# Patient Record
Sex: Female | Born: 1955
Health system: Southern US, Community
[De-identification: ages and names within clinical notes are randomized; demographics above are authoritative.]

## PROBLEM LIST (undated history)

## (undated) DIAGNOSIS — E669 Obesity, unspecified: Secondary | ICD-10-CM

## (undated) DIAGNOSIS — E8881 Metabolic syndrome: Secondary | ICD-10-CM

## (undated) DIAGNOSIS — E559 Vitamin D deficiency, unspecified: Secondary | ICD-10-CM

## (undated) DIAGNOSIS — E785 Hyperlipidemia, unspecified: Secondary | ICD-10-CM

## (undated) DIAGNOSIS — I10 Essential (primary) hypertension: Secondary | ICD-10-CM

## (undated) DIAGNOSIS — G4700 Insomnia, unspecified: Secondary | ICD-10-CM

## (undated) DIAGNOSIS — G4730 Sleep apnea, unspecified: Secondary | ICD-10-CM

## (undated) DIAGNOSIS — R32 Unspecified urinary incontinence: Secondary | ICD-10-CM

## (undated) DIAGNOSIS — Z780 Asymptomatic menopausal state: Secondary | ICD-10-CM

## (undated) HISTORY — DX: Essential (primary) hypertension: I10

## (undated) HISTORY — DX: Hyperlipidemia, unspecified: E78.5

## (undated) HISTORY — DX: Metabolic syndrome: E88.81

## (undated) HISTORY — DX: Vitamin D deficiency, unspecified: E55.9

## (undated) HISTORY — DX: Sleep apnea, unspecified: G47.30

## (undated) HISTORY — DX: Insomnia, unspecified: G47.00

## (undated) HISTORY — DX: Obesity, unspecified: E66.9

## (undated) HISTORY — DX: Asymptomatic menopausal state: Z78.0

## (undated) HISTORY — DX: Metabolic syndrome: E88.810

## (undated) HISTORY — DX: Unspecified urinary incontinence: R32

## (undated) HISTORY — PX: TUBAL LIGATION: SHX77

---

## 2005-07-28 ENCOUNTER — Ambulatory Visit: Payer: Self-pay | Admitting: Family Medicine

## 2006-09-21 ENCOUNTER — Ambulatory Visit: Payer: Self-pay | Admitting: Family Medicine

## 2008-03-05 ENCOUNTER — Ambulatory Visit: Payer: Self-pay | Admitting: Family Medicine

## 2008-04-03 ENCOUNTER — Ambulatory Visit: Payer: Self-pay | Admitting: Family Medicine

## 2008-10-03 ENCOUNTER — Ambulatory Visit: Payer: Self-pay | Admitting: Family Medicine

## 2009-03-06 ENCOUNTER — Ambulatory Visit: Payer: Self-pay | Admitting: Family Medicine

## 2010-06-09 ENCOUNTER — Ambulatory Visit: Payer: Self-pay | Admitting: Family Medicine

## 2010-07-02 ENCOUNTER — Ambulatory Visit: Payer: Self-pay | Admitting: Family Medicine

## 2011-09-05 LAB — HM PAP SMEAR: HM Pap smear: NORMAL

## 2012-01-11 ENCOUNTER — Ambulatory Visit: Payer: Self-pay | Admitting: Family Medicine

## 2014-03-25 ENCOUNTER — Ambulatory Visit: Payer: Self-pay | Admitting: Family Medicine

## 2014-03-25 LAB — HM MAMMOGRAPHY: HM Mammogram: NORMAL

## 2014-10-07 ENCOUNTER — Other Ambulatory Visit: Payer: Self-pay | Admitting: Family Medicine

## 2014-10-08 ENCOUNTER — Other Ambulatory Visit: Payer: Self-pay

## 2014-10-08 DIAGNOSIS — M25569 Pain in unspecified knee: Secondary | ICD-10-CM

## 2014-10-08 MED ORDER — TRAMADOL HCL 50 MG PO TABS: 50.0000 mg | ORAL_TABLET | Freq: Three times a day (TID) | ORAL | Status: DC | PRN

## 2014-10-25 ENCOUNTER — Encounter: Payer: Self-pay | Admitting: Family Medicine

## 2014-10-25 DIAGNOSIS — M7550 Bursitis of unspecified shoulder: Secondary | ICD-10-CM | POA: Insufficient documentation

## 2014-10-25 DIAGNOSIS — E8881 Metabolic syndrome: Secondary | ICD-10-CM | POA: Insufficient documentation

## 2014-10-25 DIAGNOSIS — L304 Erythema intertrigo: Secondary | ICD-10-CM | POA: Insufficient documentation

## 2014-10-25 DIAGNOSIS — G4700 Insomnia, unspecified: Secondary | ICD-10-CM | POA: Insufficient documentation

## 2014-10-25 DIAGNOSIS — Z780 Asymptomatic menopausal state: Secondary | ICD-10-CM | POA: Insufficient documentation

## 2014-10-25 DIAGNOSIS — E668 Other obesity: Secondary | ICD-10-CM | POA: Insufficient documentation

## 2014-10-25 DIAGNOSIS — E669 Obesity, unspecified: Secondary | ICD-10-CM | POA: Insufficient documentation

## 2014-10-25 DIAGNOSIS — E785 Hyperlipidemia, unspecified: Secondary | ICD-10-CM | POA: Insufficient documentation

## 2014-10-25 DIAGNOSIS — N393 Stress incontinence (female) (male): Secondary | ICD-10-CM | POA: Insufficient documentation

## 2014-10-25 DIAGNOSIS — L918 Other hypertrophic disorders of the skin: Secondary | ICD-10-CM | POA: Insufficient documentation

## 2014-10-25 DIAGNOSIS — E559 Vitamin D deficiency, unspecified: Secondary | ICD-10-CM | POA: Insufficient documentation

## 2014-10-25 DIAGNOSIS — I10 Essential (primary) hypertension: Secondary | ICD-10-CM | POA: Insufficient documentation

## 2014-10-25 DIAGNOSIS — M25569 Pain in unspecified knee: Secondary | ICD-10-CM | POA: Insufficient documentation

## 2014-10-25 DIAGNOSIS — G4733 Obstructive sleep apnea (adult) (pediatric): Secondary | ICD-10-CM | POA: Insufficient documentation

## 2014-10-27 ENCOUNTER — Ambulatory Visit (INDEPENDENT_AMBULATORY_CARE_PROVIDER_SITE_OTHER): Payer: 59 | Admitting: Family Medicine

## 2014-10-27 ENCOUNTER — Encounter: Payer: Self-pay | Admitting: Family Medicine

## 2014-10-27 VITALS — BP 134/86 | HR 68 | Temp 97.5°F | Resp 18 | Ht 67.0 in | Wt 342.3 lb

## 2014-10-27 DIAGNOSIS — G4700 Insomnia, unspecified: Secondary | ICD-10-CM

## 2014-10-27 DIAGNOSIS — E559 Vitamin D deficiency, unspecified: Secondary | ICD-10-CM | POA: Diagnosis not present

## 2014-10-27 DIAGNOSIS — E8881 Metabolic syndrome: Secondary | ICD-10-CM

## 2014-10-27 DIAGNOSIS — G4733 Obstructive sleep apnea (adult) (pediatric): Secondary | ICD-10-CM | POA: Diagnosis not present

## 2014-10-27 DIAGNOSIS — I10 Essential (primary) hypertension: Secondary | ICD-10-CM | POA: Diagnosis not present

## 2014-10-27 DIAGNOSIS — E785 Hyperlipidemia, unspecified: Secondary | ICD-10-CM | POA: Diagnosis not present

## 2014-10-27 DIAGNOSIS — M25569 Pain in unspecified knee: Secondary | ICD-10-CM

## 2014-10-27 DIAGNOSIS — E668 Other obesity: Secondary | ICD-10-CM

## 2014-10-27 DIAGNOSIS — E669 Obesity, unspecified: Secondary | ICD-10-CM

## 2014-10-27 MED ORDER — MELOXICAM 15 MG PO TABS: 15.0000 mg | ORAL_TABLET | Freq: Every day | ORAL | Status: DC

## 2014-10-27 MED ORDER — TRAZODONE HCL 100 MG PO TABS: 100.0000 mg | ORAL_TABLET | Freq: Every day | ORAL | Status: DC

## 2014-10-27 MED ORDER — AMLODIPINE BESY-BENAZEPRIL HCL 5-20 MG PO CAPS: 1.0000 | ORAL_CAPSULE | Freq: Two times a day (BID) | ORAL | Status: DC

## 2014-10-27 MED ORDER — PHENTERMINE-TOPIRAMATE ER 7.5-46 MG PO CP24: 1.0000 | ORAL_CAPSULE | Freq: Every day | ORAL | Status: DC

## 2014-10-27 MED ORDER — AMLODIPINE BESY-BENAZEPRIL HCL 5-20 MG PO CAPS: 1.0000 | ORAL_CAPSULE | Freq: Every day | ORAL | Status: DC

## 2014-10-27 MED ORDER — TRAMADOL HCL 50 MG PO TABS: 50.0000 mg | ORAL_TABLET | Freq: Three times a day (TID) | ORAL | Status: DC | PRN

## 2014-10-27 MED ORDER — ATORVASTATIN CALCIUM 40 MG PO TABS: 40.0000 mg | ORAL_TABLET | Freq: Every day | ORAL | Status: DC

## 2014-10-27 MED ORDER — ATENOLOL-CHLORTHALIDONE 50-25 MG PO TABS: 1.0000 | ORAL_TABLET | Freq: Every day | ORAL | Status: DC

## 2014-10-27 NOTE — Patient Instructions (Signed)

## 2014-10-27 NOTE — Progress Notes (Signed)
Name: Emma Oconnor   MRN: [id number]    DOB: 05-07-1955   Date:10/27/2014       Progress Note  Subjective  Chief Complaint  Chief Complaint  Patient presents with  . Medication Refill    6 month F/U  . Hypertension  . Insomnia    Total 5 hours, able to fall asleep, but waking up during the night  . Hyperlipidemia    HPI  HTN: taking medication for bp, no side effects, bp is at goal, she states she is only taking one pill of Lotrel daily, not two daily .  Hyperlipidemia: taking medication as prescribed. Denies side effects of medications.  Insomnia: she has noticed that she is not sleeping as well since she gained weight , Trazodone helps her fall asleep but not stay asleep. Average 5 hours on fit bit, but slept 7 hours last night.    Obesity: she was 352.6 lbs in March 2015, she was started on Qsymia and lost over 30lbs, however husband lost his job, in December 2015 and she was unable to afford medication. She is still going to the gym, but has gained weight. Currently at 342 lbs, willing to resume medication  OSA: wears CPAP every night, feels a little tired during the day, she was feeling better when she lost weight and we will resume weight loss regiment now.    Patient Active Problem List   Diagnosis Date Noted  . Benign essential HTN 10/25/2014  . Bursitis of shoulder 10/25/2014  . Insomnia, persistent 10/25/2014  . Dyslipidemia 10/25/2014  . Female stress incontinence 10/25/2014  . Eczema intertrigo 10/25/2014  . Menopause 10/25/2014  . Dysmetabolic syndrome 63/78/5885  . Extreme obesity 10/25/2014  . Obstructive apnea 10/25/2014  . Vitamin D deficiency 10/25/2014  . Skin tag 10/25/2014  . Knee pain 10/25/2014    Past Surgical History  Procedure Laterality Date  . Tubal ligation      Family History  Problem Relation Age of Onset  . Diabetes Mother   . Hypertension Mother   . Hyperlipidemia Mother   . Multiple sclerosis Brother   . Diabetes Brother      History   Social History  . Marital Status: Married    Spouse Name: N/A  . Number of Children: N/A  . Years of Education: N/A   Occupational History  . Not on file.   Social History Main Topics  . Smoking status: Former Smoker -- 0.50 packs/day for 30 years    Types: Cigarettes    Start date: 04/19/1975    Quit date: 12/17/2005  . Smokeless tobacco: Never Used  . Alcohol Use: No  . Drug Use: No  . Sexual Activity: Not Currently   Other Topics Concern  . Not on file   Social History Narrative     Current outpatient prescriptions:  .  amLODipine-benazepril (LOTREL) 5-20 MG per capsule, Take 1 capsule by mouth 2 (two) times daily., Disp: 180 capsule, Rfl: 1 .  atenolol-chlorthalidone (TENORETIC 50) 50-25 MG per tablet, Take 1 tablet by mouth daily., Disp: 90 tablet, Rfl: 1 .  atorvastatin (LIPITOR) 40 MG tablet, Take 1 tablet (40 mg total) by mouth daily at 6 PM., Disp: 90 tablet, Rfl: 1 .  meloxicam (MOBIC) 15 MG tablet, Take 1 tablet (15 mg total) by mouth daily., Disp: 90 tablet, Rfl: 1 .  traMADol (ULTRAM) 50 MG tablet, Take 1 tablet (50 mg total) by mouth every 8 (eight) hours as needed., Disp: 90 tablet, Rfl:  1 .  traZODone (DESYREL) 100 MG tablet, Take 1 tablet (100 mg total) by mouth at bedtime., Disp: 90 tablet, Rfl: 1  No Known Allergies   ROS  Constitutional: Negative for fever positive for  weight change.  Respiratory: Negative for cough and shortness of breath.   Cardiovascular: Negative for chest pain or palpitations.  Gastrointestinal: Negative for abdominal pain, no bowel changes.  Musculoskeletal: Negative for gait problem or joint swelling. Bilateral knee pain   Skin: Negative for rash.  Neurological: Negative for dizziness or headache.  No other specific complaints in a complete review of systems (except as listed in HPI above).  Objective  Filed Vitals:   10/27/14 1333  BP: 134/86  Pulse: 68  Temp: 97.5 F (36.4 C)  TempSrc: Oral   Resp: 18  Height: 5\' 7"  (1.702 m)  Weight: 342 lb 4.8 oz (155.266 kg)  SpO2: 94%    Body mass index is 53.6 kg/(m^2).  Physical Exam  Constitutional: Patient appears well-developed and well-nourished. No distress. Morbid obesity  Eyes:  No scleral icterus. PERL Neck: Normal range of motion. Neck supple. Cardiovascular: Normal rate, regular rhythm and normal heart sounds.  No murmur heard. Trace  BLE edema. Pulmonary/Chest: Effort normal and breath sounds normal. No respiratory distress. Abdominal: Soft.  There is no tenderness. Psychiatric: Patient has a normal mood and affect. behavior is normal. Judgment and thought content normal. Muscular Skeletal: crepitus with extension of both knees, no effusion    PHQ2/9: Depression screen PHQ 2/9 10/27/2014  Decreased Interest 0  Down, Depressed, Hopeless 0  PHQ - 2 Score 0     Fall Risk: Fall Risk  10/27/2014  Falls in the past year? No     Assessment & Plan  1. Benign essential HTN At goal - Comprehensive Metabolic Panel (CMET) - atenolol-chlorthalidone (TENORETIC 50) 50-25 MG per tablet; Take 1 tablet by mouth daily.  Dispense: 90 tablet; Refill: 1 - amLODipine-benazepril (LOTREL) 5-20 MG per capsule; Take 1 capsule by mouth daily.  Dispense: 90 capsule; Refill: 1  2. Dyslipidemia  - Lipid Profile - atorvastatin (LIPITOR) 40 MG tablet; Take 1 tablet (40 mg total) by mouth daily at 6 PM.  Dispense: 90 tablet; Refill: 1  3. Extreme Obesity - Phentermine-Topiramate (QSYMIA) 7.5-46 MG CP24; Take 1 capsule by mouth daily.  Dispense: 30 capsule; Refill: 2  4. Obstructive apnea Continue CPAP machine  5. Dysmetabolic syndrome  - HgB B2E  6. Vitamin D deficiency  - Vitamin D (25 hydroxy)  7. Insomnia, persistent  - traZODone (DESYREL) 100 MG tablet; Take 1 tablet (100 mg total) by mouth at bedtime.  Dispense: 90 tablet; Refill: 1  8. Knee pain, unspecified laterality  - traMADol (ULTRAM) 50 MG tablet; Take 1  tablet (50 mg total) by mouth every 8 (eight) hours as needed.  Dispense: 90 tablet; Refill: 1 - meloxicam (MOBIC) 15 MG tablet; Take 1 tablet (15 mg total) by mouth daily.  Dispense: 90 tablet; Refill: 1

## 2014-10-30 ENCOUNTER — Telehealth: Payer: Self-pay | Admitting: Family Medicine

## 2014-10-30 NOTE — Telephone Encounter (Signed)
No, I have not seen one but, I can work on one for the [employer]

## 2014-10-30 NOTE — Telephone Encounter (Signed)
Pt was prescribed qsymia. Patient called insurance and they told her that if we call them [phone] they will allow [place] to do a prior authorization and she will be able to use her discount card. Her employee id: [phone]. Please call patient once this has been completed [phone]

## 2014-10-30 NOTE — Telephone Encounter (Signed)
Have we received a PA for this patient Qsymia?

## 2014-10-31 NOTE — Telephone Encounter (Signed)
Ok. Thanks!

## 2014-11-25 ENCOUNTER — Telehealth: Payer: Self-pay | Admitting: Family Medicine

## 2014-11-25 NOTE — Telephone Encounter (Signed)
PT SAID THAT IF THE REFERRAL COORDINATOR WILL CALL [facility] ABOUT HER QYSMIA SHE CAN GET IT AT A REDUCED RATE . SAID THAT SHE WAS TOLD THAT SOMEONE WOULD CALL HER BACK AND A NOTE WAS SUPPOSE TO BE PLACED BY [facility] BUT THAT HAS BEEN 2 WKS AT LEAST AND SHE HAS NOT HEARD FROM ANYONE.

## 2014-12-15 ENCOUNTER — Ambulatory Visit (INDEPENDENT_AMBULATORY_CARE_PROVIDER_SITE_OTHER): Payer: 59 | Admitting: Family Medicine

## 2014-12-15 ENCOUNTER — Encounter: Payer: Self-pay | Admitting: Family Medicine

## 2014-12-15 VITALS — BP 136/78 | HR 83 | Temp 98.7°F | Resp 16 | Ht 67.0 in | Wt 321.8 lb

## 2014-12-15 DIAGNOSIS — Z1239 Encounter for other screening for malignant neoplasm of breast: Secondary | ICD-10-CM | POA: Diagnosis not present

## 2014-12-15 DIAGNOSIS — Z124 Encounter for screening for malignant neoplasm of cervix: Secondary | ICD-10-CM

## 2014-12-15 DIAGNOSIS — Z0000 Encounter for general adult medical examination without abnormal findings: Secondary | ICD-10-CM

## 2014-12-15 DIAGNOSIS — Z1211 Encounter for screening for malignant neoplasm of colon: Secondary | ICD-10-CM | POA: Diagnosis not present

## 2014-12-15 DIAGNOSIS — E668 Other obesity: Secondary | ICD-10-CM

## 2014-12-15 DIAGNOSIS — Z23 Encounter for immunization: Secondary | ICD-10-CM

## 2014-12-15 DIAGNOSIS — Z01419 Encounter for gynecological examination (general) (routine) without abnormal findings: Secondary | ICD-10-CM

## 2014-12-15 MED ORDER — ASPIRIN EC 81 MG PO TBEC: 81.0000 mg | DELAYED_RELEASE_TABLET | Freq: Every day | ORAL | Status: DC

## 2014-12-15 MED ORDER — ALPRAZOLAM 0.5 MG PO TBDP: 0.5000 mg | ORAL_TABLET | Freq: Three times a day (TID) | ORAL | Status: DC | PRN

## 2014-12-15 NOTE — Progress Notes (Signed)
Name: Emma Oconnor   MRN: [id number]    DOB: November 16, 1955   Date:12/15/2014       Progress Note  Subjective  Chief Complaint  Chief Complaint  Patient presents with  . Annual Exam    HPI  Well Woman Exam: she is worried about getting a colonoscopy  - afraid of going under, but willing to try. She is due for mammogram at the end of the year, pap smear was done 3 years ago without HPV we will recheck it today. Losing weight with calorie counting, eating 1750 daily and is also taking Qsymia , denies side effects of medication    Patient Active Problem List   Diagnosis Date Noted  . Benign essential HTN 10/25/2014  . Bursitis of shoulder 10/25/2014  . Insomnia, persistent 10/25/2014  . Dyslipidemia 10/25/2014  . Female stress incontinence 10/25/2014  . Eczema intertrigo 10/25/2014  . Menopause 10/25/2014  . Dysmetabolic syndrome 00/86/7619  . Extreme obesity 10/25/2014  . Obstructive apnea 10/25/2014  . Vitamin D deficiency 10/25/2014  . Skin tag 10/25/2014  . Knee pain 10/25/2014    Past Surgical History  Procedure Laterality Date  . Tubal ligation      Family History  Problem Relation Age of Onset  . Diabetes Mother   . Hypertension Mother   . Hyperlipidemia Mother   . Multiple sclerosis Brother   . Diabetes Brother     Social History   Social History  . Marital Status: Married    Spouse Name: N/A  . Number of Children: N/A  . Years of Education: N/A   Occupational History  . Not on file.   Social History Main Topics  . Smoking status: Former Smoker -- 0.50 packs/day for 30 years    Types: Cigarettes    Start date: 04/19/1975    Quit date: 12/17/2005  . Smokeless tobacco: Never Used  . Alcohol Use: No  . Drug Use: No  . Sexual Activity: Not Currently   Other Topics Concern  . Not on file   Social History Narrative     Current outpatient prescriptions:  .  amLODipine-benazepril (LOTREL) 5-20 MG per capsule, Take 1 capsule by mouth daily., Disp:  90 capsule, Rfl: 1 .  atenolol-chlorthalidone (TENORETIC 50) 50-25 MG per tablet, Take 1 tablet by mouth daily., Disp: 90 tablet, Rfl: 1 .  atorvastatin (LIPITOR) 40 MG tablet, Take 1 tablet (40 mg total) by mouth daily at 6 PM., Disp: 90 tablet, Rfl: 1 .  Phentermine-Topiramate (QSYMIA) 7.5-46 MG CP24, Take 1 capsule by mouth daily., Disp: 30 capsule, Rfl: 2 .  traMADol (ULTRAM) 50 MG tablet, Take 1 tablet (50 mg total) by mouth every 8 (eight) hours as needed., Disp: 90 tablet, Rfl: 1 .  traZODone (DESYREL) 100 MG tablet, Take 1 tablet (100 mg total) by mouth at bedtime., Disp: 90 tablet, Rfl: 1 .  ALPRAZolam (NIRAVAM) 0.5 MG dissolvable tablet, Take 1 tablet (0.5 mg total) by mouth 3 (three) times daily as needed for anxiety., Disp: 4 tablet, Rfl: 0 .  meloxicam (MOBIC) 15 MG tablet, Take 1 tablet (15 mg total) by mouth daily. (Patient not taking: Reported on 12/15/2014), Disp: 90 tablet, Rfl: 1  No Known Allergies   ROS  Constitutional: Negative for fever positive for  weight change.  Respiratory: Negative for cough and shortness of breath.   Cardiovascular: Negative for chest pain or palpitations.  Gastrointestinal: Negative for abdominal pain, no bowel changes.  Musculoskeletal: Negative for gait problem or joint  swelling.  Skin: Negative for rash.  Neurological: Negative for dizziness or headache.  No other specific complaints in a complete review of systems (except as listed in HPI above).  Objective  Filed Vitals:   12/15/14 1414  BP: 136/78  Pulse: 83  Temp: 98.7 F (37.1 C)  TempSrc: Oral  Resp: 16  Height: 5\' 7"  (1.702 m)  Weight: 321 lb 12.8 oz (145.968 kg)  SpO2: 97%    Body mass index is 50.39 kg/(m^2).  Physical Exam  Constitutional: Patient appears well-developed and morbidly obesity . No distress.  HENT: Head: Normocephalic and atraumatic. Ears: B TMs ok, no erythema or effusion; Nose: Nose normal. Mouth/Throat: Oropharynx is clear and moist. No  oropharyngeal exudate.  Eyes: Conjunctivae and EOM are normal. Pupils are equal, round, and reactive to light. No scleral icterus.  Neck: Normal range of motion. Neck supple. No JVD present. No thyromegaly present.  Cardiovascular: Normal rate, regular rhythm and normal heart sounds.  No murmur heard. No BLE edema. Pulmonary/Chest: Effort normal and breath sounds normal. No respiratory distress. Abdominal: Soft. Bowel sounds are normal, no distension. There is no tenderness. no masses Breast: no lumps or masses, no nipple discharge or rashes FEMALE GENITALIA:  External genitalia normal External urethra normal Vaginal vault normal without discharge or lesions Cervix normal without discharge or lesions Bimanual exam normal without masses RECTAL: not done Musculoskeletal: Normal range of motion, no joint effusions. No gross deformities Neurological: he is alert and oriented to person, place, and time. No cranial nerve deficit. Coordination, balance, strength, speech and gait are normal.  Skin: Skin is warm and dry. No rash noted. No erythema.  Psychiatric: Patient has a normal mood and affect. behavior is normal. Judgment and thought content normal.  PHQ2/9: Depression screen PHQ 2/9 10/27/2014  Decreased Interest 0  Down, Depressed, Hopeless 0  PHQ - 2 Score 0    Fall Risk: Fall Risk  10/27/2014  Falls in the past year? No     Assessment & Plan  1. Well woman exam Discussed importance of 150 minutes of physical activity weekly, eat two servings of fish weekly, eat one serving of tree nuts ( cashews, pistachios, pecans, almonds.Marland Kitchen) every other day, eat 6 servings of fruit/vegetables daily and drink plenty of water and avoid sweet beverages.   2. Needs flu shot  - Flu Vaccine QUAD 36+ mos IM  3. Cervical cancer screening  - PapLb, HPV, rfx16/18  4. Colon cancer screening  - Ambulatory referral to Gastroenterology - ALPRAZolam (NIRAVAM) 0.5 MG dissolvable tablet; Take 1 tablet  (0.5 mg total) by mouth 3 (three) times daily as needed for anxiety.  Dispense: 4 tablet; Refill: 0  5. Breast cancer screening  - MM Digital Screening; Future  6. Extreme obesity She has changed her diet , eating 1750 calories daily, taking Qsymia for the past month and has lost 21 lbs . Doing well.

## 2014-12-16 LAB — COMPREHENSIVE METABOLIC PANEL
ALBUMIN: 4 g/dL (ref 3.5–5.5)
ALT: 16 IU/L (ref 0–32)
AST: 19 IU/L (ref 0–40)
Albumin/Globulin Ratio: 1.5 (ref 1.1–2.5)
Alkaline Phosphatase: 99 IU/L (ref 39–117)
BUN/Creatinine Ratio: 19 (ref 9–23)
BUN: 14 mg/dL (ref 6–24)
Bilirubin Total: 0.4 mg/dL (ref 0.0–1.2)
CO2: 27 mmol/L (ref 18–29)
Calcium: 9.3 mg/dL (ref 8.7–10.2)
Chloride: 99 mmol/L (ref 97–108)
Creatinine, Ser: 0.73 mg/dL (ref 0.57–1.00)
GFR calc Af Amer: 106 mL/min/{1.73_m2} (ref >59)
GFR, EST NON AFRICAN AMERICAN: 92 mL/min/{1.73_m2} (ref >59)
GLOBULIN, TOTAL: 2.7 g/dL (ref 1.5–4.5)
GLUCOSE: 97 mg/dL (ref 65–99)
Potassium: 3 mmol/L — ABNORMAL LOW (ref 3.5–5.2)
SODIUM: 142 mmol/L (ref 134–144)
Total Protein: 6.7 g/dL (ref 6.0–8.5)

## 2014-12-16 LAB — LIPID PANEL
Chol/HDL Ratio: 2.9 ratio units (ref 0.0–4.4)
Cholesterol, Total: 175 mg/dL (ref 100–199)
HDL: 61 mg/dL (ref >39)
LDL Calculated: 95 mg/dL (ref 0–99)
Triglycerides: 97 mg/dL (ref 0–149)
VLDL Cholesterol Cal: 19 mg/dL (ref 5–40)

## 2014-12-16 LAB — HEMOGLOBIN A1C
ESTIMATED AVERAGE GLUCOSE: 126 mg/dL
Hgb A1c MFr Bld: 6 % — ABNORMAL HIGH (ref 4.8–5.6)

## 2014-12-16 LAB — VITAMIN D 25 HYDROXY (VIT D DEFICIENCY, FRACTURES): Vit D, 25-Hydroxy: 29.9 ng/mL — ABNORMAL LOW (ref 30.0–100.0)

## 2014-12-16 NOTE — Progress Notes (Signed)
Left voice mail

## 2014-12-20 LAB — PAPLB, HPV, RFX16/18: PAP SMEAR COMMENT: 0

## 2014-12-23 NOTE — Progress Notes (Signed)
Left voice mail

## 2014-12-23 NOTE — Progress Notes (Signed)
Patient notified

## 2014-12-30 ENCOUNTER — Other Ambulatory Visit: Payer: Self-pay

## 2014-12-30 ENCOUNTER — Telehealth: Payer: Self-pay | Admitting: Gastroenterology

## 2014-12-30 NOTE — Telephone Encounter (Signed)
Gastroenterology Pre-Procedure Review  Request Date: 02-03-2015 Requesting Physician: Dr. Ancil Boozer  PATIENT REVIEW QUESTIONS: The patient responded to the following health history questions as indicated:    1. Are you having any GI issues? no 2. Do you have a personal history of Polyps? no 3. Do you have a family history of Colon Cancer or Polyps? no 4. Diabetes Mellitus? no 5. Joint replacements in the past 12 months?no 6. Major health problems in the past 3 months?no 7. Any artificial heart valves, MVP, or defibrillator?no    MEDICATIONS & ALLERGIES:    Patient reports the following regarding taking any anticoagulation/antiplatelet therapy:   Plavix, Coumadin, Eliquis, Xarelto, Lovenox, Pradaxa, Brilinta, or Effient? no Aspirin? yes (Daily)  Patient confirms/reports the following medications:  Current Outpatient Prescriptions  Medication Sig Dispense Refill   ALPRAZolam (NIRAVAM) 0.5 MG dissolvable tablet Take 1 tablet (0.5 mg total) by mouth 3 (three) times daily as needed for anxiety. 4 tablet 0   amLODipine-benazepril (LOTREL) 5-20 MG per capsule Take 1 capsule by mouth daily. 90 capsule 1   aspirin EC 81 MG tablet Take 1 tablet (81 mg total) by mouth daily. 30 tablet 0   atenolol-chlorthalidone (TENORETIC 50) 50-25 MG per tablet Take 1 tablet by mouth daily. 90 tablet 1   atorvastatin (LIPITOR) 40 MG tablet Take 1 tablet (40 mg total) by mouth daily at 6 PM. 90 tablet 1   meloxicam (MOBIC) 15 MG tablet Take 1 tablet (15 mg total) by mouth daily. (Patient not taking: Reported on 12/15/2014) 90 tablet 1   Phentermine-Topiramate (QSYMIA) 7.5-46 MG CP24 Take 1 capsule by mouth daily. 30 capsule 2   traMADol (ULTRAM) 50 MG tablet Take 1 tablet (50 mg total) by mouth every 8 (eight) hours as needed. 90 tablet 1   traZODone (DESYREL) 100 MG tablet Take 1 tablet (100 mg total) by mouth at bedtime. 90 tablet 1   No current facility-administered medications for this visit.     Patient confirms/reports the following allergies:  No Known Allergies  No orders of the defined types were placed in this encounter.    AUTHORIZATION INFORMATION Primary Insurance: 1D#: Group #:  Secondary Insurance: 1D#: Group #:  SCHEDULE INFORMATION: Date: 02-03-2015 Time: Location:[facility]

## 2015-01-27 ENCOUNTER — Ambulatory Visit (INDEPENDENT_AMBULATORY_CARE_PROVIDER_SITE_OTHER): Payer: 59 | Admitting: Family Medicine

## 2015-01-27 ENCOUNTER — Encounter: Payer: Self-pay | Admitting: Family Medicine

## 2015-01-27 ENCOUNTER — Encounter (INDEPENDENT_AMBULATORY_CARE_PROVIDER_SITE_OTHER): Payer: Self-pay

## 2015-01-27 VITALS — BP 116/64 | HR 72 | Temp 98.7°F | Resp 16 | Ht 67.0 in | Wt 315.4 lb

## 2015-01-27 DIAGNOSIS — G4733 Obstructive sleep apnea (adult) (pediatric): Secondary | ICD-10-CM | POA: Diagnosis not present

## 2015-01-27 DIAGNOSIS — I10 Essential (primary) hypertension: Secondary | ICD-10-CM | POA: Diagnosis not present

## 2015-01-27 DIAGNOSIS — M25561 Pain in right knee: Secondary | ICD-10-CM

## 2015-01-27 DIAGNOSIS — E785 Hyperlipidemia, unspecified: Secondary | ICD-10-CM | POA: Diagnosis not present

## 2015-01-27 DIAGNOSIS — M25562 Pain in left knee: Secondary | ICD-10-CM

## 2015-01-27 DIAGNOSIS — G4700 Insomnia, unspecified: Secondary | ICD-10-CM | POA: Diagnosis not present

## 2015-01-27 MED ORDER — PHENTERMINE-TOPIRAMATE ER 7.5-46 MG PO CP24: 1.0000 | ORAL_CAPSULE | Freq: Every day | ORAL | Status: DC

## 2015-01-27 NOTE — Progress Notes (Signed)
Name: Emma Oconnor   MRN: [id number]    DOB: 07/18/1955   Date:01/27/2015       Progress Note  Subjective  Chief Complaint  Chief Complaint  Patient presents with  . Medication Refill    follow-up  . Hypertension  . Hyperlipidemia  . Insomnia  . Arthritis  . Obesity    HPI   HTN: taking medication for bp, no side effects, bp is at goal, no dizziness, no chest pain or palpitation  Hyperlipidemia: taking medication as prescribed. Denies side effects of medications, no myalgia  Insomnia: Trazodone helps her fall asleep but not stay asleep. Average 5 hours on fit bit, waking up sometimes because of knee pain.    Obesity: she was 352.6 lbs in March 2015, she was started on Qsymia and lost over  5 % of original weight, however husband lost his job, in December 2015 and she was unable to afford medication. She is still going to the gym, but has gained weight, we resume medication at weight of 342 lbs in July and she is down to 315 lbs today. Still following diet and going to the gym on regular basis   OSA: wears CPAP but she has been skipping it at times because she does not want to be addicted to the machine, since it makes her wake up feeling better. Explained importance of compliance and risk of heart attacks and strokes if she does not use it.   Knee pain: bilateral pain, intermittently, aching like, around anterior knee, worse with activity, taking Meloxicam and Tramadol and seems to help, sometimes takes Tylenol    Patient Active Problem List   Diagnosis Date Noted  . Benign essential HTN 10/25/2014  . Bursitis of shoulder 10/25/2014  . Insomnia, persistent 10/25/2014  . Dyslipidemia 10/25/2014  . Female stress incontinence 10/25/2014  . Eczema intertrigo 10/25/2014  . Menopause 10/25/2014  . Dysmetabolic syndrome 15/72/6203  . Extreme obesity ([place]) 10/25/2014  . Obstructive apnea 10/25/2014  . Vitamin D deficiency 10/25/2014  . Skin tag 10/25/2014  . Knee pain  10/25/2014    Past Surgical History  Procedure Laterality Date  . Tubal ligation      Family History  Problem Relation Age of Onset  . Diabetes Mother   . Hypertension Mother   . Hyperlipidemia Mother   . Multiple sclerosis Brother   . Diabetes Brother     Social History   Social History  . Marital Status: Married    Spouse Name: N/A  . Number of Children: N/A  . Years of Education: N/A   Occupational History  . Not on file.   Social History Main Topics  . Smoking status: Former Smoker -- 0.50 packs/day for 30 years    Types: Cigarettes    Start date: 04/19/1975    Quit date: 12/17/2005  . Smokeless tobacco: Never Used  . Alcohol Use: No  . Drug Use: No  . Sexual Activity: Not Currently   Other Topics Concern  . Not on file   Social History Narrative     Current outpatient prescriptions:  .  amLODipine-benazepril (LOTREL) 5-20 MG per capsule, Take 1 capsule by mouth daily., Disp: 90 capsule, Rfl: 1 .  aspirin EC 81 MG tablet, Take 1 tablet (81 mg total) by mouth daily., Disp: 30 tablet, Rfl: 0 .  atenolol-chlorthalidone (TENORETIC 50) 50-25 MG per tablet, Take 1 tablet by mouth daily., Disp: 90 tablet, Rfl: 1 .  atorvastatin (LIPITOR) 40 MG tablet, Take 1  tablet (40 mg total) by mouth daily at 6 PM., Disp: 90 tablet, Rfl: 1 .  meloxicam (MOBIC) 15 MG tablet, Take 1 tablet (15 mg total) by mouth daily. (Patient not taking: Reported on 12/15/2014), Disp: 90 tablet, Rfl: 1 .  Phentermine-Topiramate (QSYMIA) 7.5-46 MG CP24, Take 1 capsule by mouth daily., Disp: 30 capsule, Rfl: 2 .  traMADol (ULTRAM) 50 MG tablet, Take 1 tablet (50 mg total) by mouth every 8 (eight) hours as needed., Disp: 90 tablet, Rfl: 1 .  traZODone (DESYREL) 100 MG tablet, Take 1 tablet (100 mg total) by mouth at bedtime., Disp: 90 tablet, Rfl: 1  No Known Allergies   ROS  Constitutional: Negative for fever and positive for  weight change.  Respiratory: Negative for cough and shortness of  breath.   Cardiovascular: Negative for chest pain or palpitations.  Gastrointestinal: Negative for abdominal pain, no bowel changes.  Musculoskeletal: Negative for gait problem or joint swelling.  Skin: Negative for rash.  Neurological: Negative for dizziness or headache.  No other specific complaints in a complete review of systems (except as listed in HPI above).  Objective  Filed Vitals:   01/27/15 1609  BP: 116/64  Pulse: 72  Temp: 98.7 F (37.1 C)  TempSrc: Oral  Resp: 16  Height: 5\' 7"  (1.702 m)  Weight: 315 lb 6.4 oz (143.065 kg)  SpO2: 96%    Body mass index is 49.39 kg/(m^2).  Physical Exam  Constitutional: Patient appears well-developed and well-nourished. Obese No distress.  HEENT: head atraumatic, normocephalic, pupils equal and reactive to light,  neck supple, throat within normal limits Cardiovascular: Normal rate, regular rhythm and normal heart sounds.  No murmur heard. No BLE edema. Pulmonary/Chest: Effort normal and breath sounds normal. No respiratory distress. Abdominal: Soft.  There is no tenderness. Psychiatric: Patient has a normal mood and affect. behavior is normal. Judgment and thought content normal. Muscular Skeletal: some grinding with extension of both knees, no effusion, no redness  Recent Results (from the past 2160 hour(s))  Lipid Profile     Status: None   Collection Time: 12/15/14  7:58 AM  Result Value Ref Range   Cholesterol, Total 175 100 - 199 mg/dL   Triglycerides 97 0 - 149 mg/dL   HDL 61 >39 mg/dL    Comment: According to ATP-III Guidelines, HDL-C >59 mg/dL is considered a negative risk factor for CHD.    VLDL Cholesterol Cal 19 5 - 40 mg/dL   LDL Calculated 95 0 - 99 mg/dL   Chol/HDL Ratio 2.9 0.0 - 4.4 ratio units    Comment:                                   T. Chol/HDL Ratio                                             Men  Women                               1/2 Avg.Risk  3.4    3.3  Avg.Risk  5.0    4.4                                2X Avg.Risk  9.6    7.1                                3X Avg.Risk 23.4   11.0   Comprehensive Metabolic Panel (CMET)     Status: Abnormal   Collection Time: 12/15/14  7:58 AM  Result Value Ref Range   Glucose 97 65 - 99 mg/dL   BUN 14 6 - 24 mg/dL   Creatinine, Ser 0.73 0.57 - 1.00 mg/dL   GFR calc non Af Amer 92 >59 mL/min/1.73   GFR calc Af Amer 106 >59 mL/min/1.73   BUN/Creatinine Ratio 19 9 - 23   Sodium 142 134 - 144 mmol/L   Potassium 3.0 (L) 3.5 - 5.2 mmol/L   Chloride 99 97 - 108 mmol/L   CO2 27 18 - 29 mmol/L   Calcium 9.3 8.7 - 10.2 mg/dL   Total Protein 6.7 6.0 - 8.5 g/dL   Albumin 4.0 3.5 - 5.5 g/dL   Globulin, Total 2.7 1.5 - 4.5 g/dL   Albumin/Globulin Ratio 1.5 1.1 - 2.5   Bilirubin Total 0.4 0.0 - 1.2 mg/dL   Alkaline Phosphatase 99 39 - 117 IU/L   AST 19 0 - 40 IU/L   ALT 16 0 - 32 IU/L  HgB A1c     Status: Abnormal   Collection Time: 12/15/14  7:58 AM  Result Value Ref Range   Hgb A1c MFr Bld 6.0 (H) 4.8 - 5.6 %    Comment:          Pre-diabetes: 5.7 - 6.4          Diabetes: >6.4          Glycemic control for adults with diabetes: <7.0    Est. average glucose Bld gHb Est-mCnc 126 mg/dL  Vitamin D (25 hydroxy)     Status: Abnormal   Collection Time: 12/15/14  7:58 AM  Result Value Ref Range   Vit D, 25-Hydroxy 29.9 (L) 30.0 - 100.0 ng/mL    Comment: Vitamin D deficiency has been defined by the Institute of Medicine and an Endocrine Society practice guideline as a level of serum 25-OH vitamin D less than 20 ng/mL (1,2). The Endocrine Society went on to further define vitamin D insufficiency as a level between 21 and 29 ng/mL (2). 1. IOM (Institute of Medicine). 2010. Dietary reference    intakes for calcium and D. [place]: The    [employer]. 2. Holick MF, Binkley Vega, Bischoff-Ferrari HA, et al.    Evaluation, treatment, and prevention of vitamin D    deficiency: an Endocrine  Society clinical practice    guideline. JCEM. 2011 Jul; 96(7):1911-30.   PapLb, HPV, rfx16/18     Status: None   Collection Time: 12/16/14 12:00 AM  Result Value Ref Range   DIAGNOSIS: Comment     Comment: NEGATIVE FOR INTRAEPITHELIAL LESION AND MALIGNANCY.   Specimen adequacy: Comment     Comment: Satisfactory for evaluation. No endocervical component is identified.   CLINICIAN PROVIDED ICD10: Comment     Comment: Z12.4   Performed by: Comment     Comment: Charity Atilano Median, Cytotechnologist (ASCP)   PAP SMEAR COMMENT .  Note: Comment     Comment: The Pap smear is a screening test designed to aid in the detection of premalignant and malignant conditions of the uterine cervix.  It is not a diagnostic procedure and should not be used as the sole means of detecting cervical cancer.  Both false-positive and false-negative reports do occur.     PHQ2/9: Depression screen [facility] 2/9 01/27/2015 10/27/2014  Decreased Interest 0 0  Down, Depressed, Hopeless 0 0  PHQ - 2 Score 0 0    Fall Risk: Fall Risk  01/27/2015 10/27/2014  Falls in the past year? No No    Functional Status Survey: Is the patient deaf or have difficulty hearing?: No Does the patient have difficulty seeing, even when wearing glasses/contacts?: Yes (reading glasses) Does the patient have difficulty concentrating, remembering, or making decisions?: No Does the patient have difficulty walking or climbing stairs?: No Does the patient have difficulty dressing or bathing?: No Does the patient have difficulty doing errands alone such as visiting a doctor's office or shopping?: No    Assessment & Plan  1. Benign essential HTN  Taking medication and blood pressure is at goal   2. Dyslipidemia  Taking medication and is doing well   3. Obstructive apnea  Explained importance of compliance  4. Insomnia, persistent  Doing okay, does not want to change medication   5. Morbid obesity due to excess calories  (HCC)  Doing well on medication  - Phentermine-Topiramate (QSYMIA) 7.5-46 MG CP24; Take 1 capsule by mouth daily.  Dispense: 30 capsule; Refill: 2  6. Arthralgia of both knees  Advised to try to only taking Meloxicam prn, continue Tramadol and Tylenol prn

## 2015-01-28 ENCOUNTER — Telehealth: Payer: Self-pay | Admitting: Family Medicine

## 2015-01-28 ENCOUNTER — Other Ambulatory Visit: Payer: Self-pay

## 2015-01-28 DIAGNOSIS — M25569 Pain in unspecified knee: Secondary | ICD-10-CM

## 2015-01-28 MED ORDER — TRAMADOL HCL 50 MG PO TABS: 50.0000 mg | ORAL_TABLET | Freq: Three times a day (TID) | ORAL | Status: DC | PRN

## 2015-01-28 NOTE — Telephone Encounter (Signed)
A [employer] Screening form was filled out for the patient, but the NPID number was left blank. Patient would like to know if you could give her a call and give her that number states she will write it in for you. If you are able to give her the number just leave it on her work voice mail if she is not able to answer. [phone]

## 2015-01-28 NOTE — Telephone Encounter (Signed)
Patient requesting refill. 

## 2015-01-30 ENCOUNTER — Other Ambulatory Visit: Payer: Self-pay

## 2015-02-17 ENCOUNTER — Encounter
Admission: RE | Disposition: A | Discharge status: Home / Self Care | Payer: Self-pay | Source: Ambulatory Visit | Attending: Gastroenterology

## 2015-02-17 ENCOUNTER — Encounter: Payer: Self-pay | Admitting: *Deleted

## 2015-02-17 ENCOUNTER — Ambulatory Visit: Payer: 59 | Admitting: Anesthesiology

## 2015-02-17 ENCOUNTER — Ambulatory Visit
Admission: RE | Admit: 2015-02-17 | Discharge: 2015-02-17 | Disposition: A | Discharge status: Home / Self Care | Payer: 59 | Source: Ambulatory Visit | Attending: Gastroenterology | Admitting: Gastroenterology

## 2015-02-17 DIAGNOSIS — K5730 Diverticulosis of large intestine without perforation or abscess without bleeding: Secondary | ICD-10-CM | POA: Insufficient documentation

## 2015-02-17 DIAGNOSIS — Z1211 Encounter for screening for malignant neoplasm of colon: Secondary | ICD-10-CM | POA: Insufficient documentation

## 2015-02-17 DIAGNOSIS — Z6841 Body Mass Index (BMI) 40.0 and over, adult: Secondary | ICD-10-CM | POA: Insufficient documentation

## 2015-02-17 DIAGNOSIS — Z7982 Long term (current) use of aspirin: Secondary | ICD-10-CM | POA: Insufficient documentation

## 2015-02-17 DIAGNOSIS — D125 Benign neoplasm of sigmoid colon: Secondary | ICD-10-CM | POA: Insufficient documentation

## 2015-02-17 DIAGNOSIS — G4730 Sleep apnea, unspecified: Secondary | ICD-10-CM | POA: Insufficient documentation

## 2015-02-17 DIAGNOSIS — K641 Second degree hemorrhoids: Secondary | ICD-10-CM | POA: Diagnosis not present

## 2015-02-17 DIAGNOSIS — E559 Vitamin D deficiency, unspecified: Secondary | ICD-10-CM | POA: Insufficient documentation

## 2015-02-17 DIAGNOSIS — D122 Benign neoplasm of ascending colon: Secondary | ICD-10-CM | POA: Insufficient documentation

## 2015-02-17 DIAGNOSIS — I10 Essential (primary) hypertension: Secondary | ICD-10-CM | POA: Diagnosis not present

## 2015-02-17 DIAGNOSIS — E785 Hyperlipidemia, unspecified: Secondary | ICD-10-CM | POA: Insufficient documentation

## 2015-02-17 DIAGNOSIS — Z87891 Personal history of nicotine dependence: Secondary | ICD-10-CM | POA: Insufficient documentation

## 2015-02-17 DIAGNOSIS — E669 Obesity, unspecified: Secondary | ICD-10-CM | POA: Insufficient documentation

## 2015-02-17 DIAGNOSIS — Z79899 Other long term (current) drug therapy: Secondary | ICD-10-CM | POA: Insufficient documentation

## 2015-02-17 HISTORY — PX: COLONOSCOPY WITH PROPOFOL: SHX5780

## 2015-02-17 SURGERY — COLONOSCOPY WITH PROPOFOL
Anesthesia: General

## 2015-02-17 MED ORDER — MIDAZOLAM HCL 2 MG/2ML IJ SOLN
INTRAMUSCULAR | Status: DC | PRN
  Administered 2015-02-17: 1 mg via INTRAVENOUS

## 2015-02-17 MED ORDER — FENTANYL CITRATE (PF) 100 MCG/2ML IJ SOLN
INTRAMUSCULAR | Status: DC | PRN
  Administered 2015-02-17: 50 ug via INTRAVENOUS

## 2015-02-17 MED ORDER — PROPOFOL 500 MG/50ML IV EMUL
INTRAVENOUS | Status: DC | PRN
  Administered 2015-02-17: 120 ug/kg/min via INTRAVENOUS

## 2015-02-17 MED ORDER — PROPOFOL 10 MG/ML IV BOLUS
INTRAVENOUS | Status: DC | PRN
  Administered 2015-02-17: 110 mg via INTRAVENOUS

## 2015-02-17 MED ORDER — SODIUM CHLORIDE 0.9 % IV SOLN
INTRAVENOUS | Status: DC
  Administered 2015-02-17: 09:00:00 via INTRAVENOUS

## 2015-02-17 NOTE — H&P (Signed)
[facility]  [address] [place], [place] [address] Phone: [phone] Fax : [phone]  Primary Care Physician:  Loistine Chance, MD Primary Gastroenterologist:  Dr. Allen Norris  Pre-Procedure History & Physical: HPI:  Emma Oconnor is a 59 y.o. female is here for a screening colonoscopy.   Past Medical History  Diagnosis Date  . Menopause   . Sleep apnea   . Hypertension   . Hyperlipidemia   . Insomnia   . Obesity   . Metabolic syndrome   . Incontinence   . Vitamin D deficiency     Past Surgical History  Procedure Laterality Date  . Tubal ligation      Prior to Admission medications   Medication Sig Start Date End Date Taking? Authorizing Provider  aspirin EC 81 MG tablet Take 1 tablet (81 mg total) by mouth daily. 12/15/14  Yes Steele Sizer, MD  atenolol-chlorthalidone (TENORETIC 50) 50-25 MG per tablet Take 1 tablet by mouth daily. 10/27/14  Yes Steele Sizer, MD  amLODipine-benazepril (LOTREL) 5-20 MG per capsule Take 1 capsule by mouth daily. 10/27/14   Steele Sizer, MD  atorvastatin (LIPITOR) 40 MG tablet Take 1 tablet (40 mg total) by mouth daily at 6 PM. 10/27/14   Steele Sizer, MD  meloxicam (MOBIC) 15 MG tablet Take 1 tablet (15 mg total) by mouth daily. Patient not taking: Reported on 12/15/2014 10/27/14   Steele Sizer, MD  Phentermine-Topiramate [facility]) 7.5-46 MG CP24 Take 1 capsule by mouth daily. 01/27/15   Steele Sizer, MD  traMADol (ULTRAM) 50 MG tablet Take 1 tablet (50 mg total) by mouth every 8 (eight) hours as needed. 01/28/15   Steele Sizer, MD  traZODone (DESYREL) 100 MG tablet Take 1 tablet (100 mg total) by mouth at bedtime. 10/27/14   Steele Sizer, MD    Allergies as of 12/30/2014  . (No Known Allergies)    Family History  Problem Relation Age of Onset  . Diabetes Mother   . Hypertension Mother   . Hyperlipidemia Mother   . Multiple sclerosis Brother   . Diabetes Brother     Social History   Social  History  . Marital Status: Married    Spouse Name: N/A  . Number of Children: N/A  . Years of Education: N/A   Occupational History  . Not on file.   Social History Main Topics  . Smoking status: Former Smoker -- 0.50 packs/day for 30 years    Types: Cigarettes    Start date: 04/19/1975    Quit date: 12/17/2005  . Smokeless tobacco: Never Used  . Alcohol Use: No  . Drug Use: No  . Sexual Activity: Not Currently   Other Topics Concern  . Not on file   Social History Narrative    Review of Systems: See HPI, otherwise negative ROS  Physical Exam: BP 156/95 mmHg  Pulse 92  Temp(Src) 96.5 F (35.8 C) (Tympanic)  Resp 20  Ht 5\' 7"  (1.702 m)  Wt 315 lb (142.883 kg)  BMI 49.32 kg/m2  SpO2 100% General:   Alert,  pleasant and cooperative in NAD Head:  Normocephalic and atraumatic. Neck:  Supple; no masses or thyromegaly. Lungs:  Clear throughout to auscultation.    Heart:  Regular rate and rhythm. Abdomen:  Soft, nontender and nondistended. Normal bowel sounds, without guarding, and without rebound.   Neurologic:  Alert and  oriented x4;  grossly normal neurologically.  Impression/Plan: Emma Oconnor is now here to undergo a screening colonoscopy.  Risks, benefits, and alternatives regarding colonoscopy have been reviewed with the patient.  Questions have been answered.  All parties agreeable.

## 2015-02-17 NOTE — Anesthesia Preprocedure Evaluation (Signed)
Anesthesia Evaluation  Patient identified by MRN, date of birth, ID band Patient awake    Reviewed: Allergy & Precautions, H&P , NPO status , Patient's Chart, lab work & pertinent test results  History of Anesthesia Complications Negative for: history of anesthetic complications  Airway Mallampati: III  TM Distance: >3 FB Neck ROM: full    Dental  (+) Poor Dentition, Missing, Upper Dentures, Lower Dentures   Pulmonary neg pulmonary ROS, neg shortness of breath, sleep apnea and Continuous Positive Airway Pressure Ventilation , former smoker,    Pulmonary exam normal breath sounds clear to auscultation       Cardiovascular Exercise Tolerance: Good hypertension, (-) angina(-) Past MI and (-) DOE Normal cardiovascular exam Rhythm:regular Rate:Normal     Neuro/Psych negative neurological ROS  negative psych ROS   GI/Hepatic negative GI ROS, Neg liver ROS,   Endo/Other  Morbid obesity  Renal/GU negative Renal ROS  negative genitourinary   Musculoskeletal   Abdominal   Peds  Hematology negative hematology ROS (+)   Anesthesia Other Findings Past Medical History:   Menopause                                                    Sleep apnea                                                  Hypertension                                                 Hyperlipidemia                                               Insomnia                                                     Obesity                                                      Metabolic syndrome                                           Incontinence                                                 Vitamin D deficiency  Past Surgical History:   TUBAL LIGATION                                               BMI    Body Mass Index   49.32 kg/m 2      Reproductive/Obstetrics negative OB ROS                              Anesthesia Physical Anesthesia Plan  ASA: III  Anesthesia Plan: General   Post-op Pain Management:    Induction:   Airway Management Planned:   Additional Equipment:   Intra-op Plan:   Post-operative Plan:   Informed Consent: I have reviewed the patients History and Physical, chart, labs and discussed the procedure including the risks, benefits and alternatives for the proposed anesthesia with the patient or authorized representative who has indicated his/her understanding and acceptance.   Dental Advisory Given  Plan Discussed with: Anesthesiologist, CRNA and Surgeon  Anesthesia Plan Comments:         Anesthesia Quick Evaluation

## 2015-02-17 NOTE — Anesthesia Postprocedure Evaluation (Signed)
  Anesthesia Post-op Note  Patient: Emma Oconnor  Procedure(s) Performed: Procedure(s): COLONOSCOPY WITH PROPOFOL (N/A)  Anesthesia type:General  Patient location: PACU  Post pain: Pain level controlled  Post assessment: Post-op Vital signs reviewed, Patient's Cardiovascular Status Stable, Respiratory Function Stable, Patent Airway and No signs of Nausea or vomiting  Post vital signs: Reviewed and stable  Last Vitals:  Filed Vitals:   02/17/15 0919  BP: 126/74  Pulse: 80  Temp: 35.4 C  Resp: 20    Level of consciousness: awake, alert  and patient cooperative  Complications: No apparent anesthesia complications

## 2015-02-17 NOTE — Transfer of Care (Signed)
Immediate Anesthesia Transfer of Care Note  Patient: Emma Oconnor  Procedure(s) Performed: Procedure(s): COLONOSCOPY WITH PROPOFOL (N/A)  Patient Location: PACU and Endoscopy Unit  Anesthesia Type:General  Level of Consciousness: sedated and responds to stimulation  Airway & Oxygen Therapy: Patient Spontanous Breathing and Patient connected to nasal cannula oxygen  Post-op Assessment: Report given to RN and Post -op Vital signs reviewed and stable  Post vital signs: Reviewed and stable  Last Vitals:  Filed Vitals:   02/17/15 0919  BP: 126/74  Pulse: 80  Temp: 35.4 C  Resp: 20    Complications: No apparent anesthesia complications

## 2015-02-17 NOTE — Op Note (Addendum)
[facility] Gastroenterology Patient Name: Emma Oconnor Procedure Date: 02/17/2015 8:48 AM MRN: [id number] Account #: [id number] Date of Birth: [birth year] Admit Type: Outpatient Age: 59 Room: [facility] ENDO ROOM 4 Gender: Female Note Status: Supervisor Override Procedure:         Colonoscopy Indications:       Screening for colorectal malignant neoplasm Providers:         Lucilla Lame, MD Referring MD:      Bethena Roys. Sowles, MD (Referring MD) Medicines:         Propofol per Anesthesia Complications:     No immediate complications. Procedure:         Pre-Anesthesia Assessment:                    - Prior to the procedure, a History and Physical was                     performed, and patient medications and allergies were                     reviewed. The patient's tolerance of previous anesthesia                     was also reviewed. The risks and benefits of the procedure                     and the sedation options and risks were discussed with the                     patient. All questions were answered, and informed consent                     was obtained. Prior Anticoagulants: The patient has taken                     no previous anticoagulant or antiplatelet agents. ASA                     Grade Assessment: II - A patient with mild systemic                     disease. After reviewing the risks and benefits, the                     patient was deemed in satisfactory condition to undergo                     the procedure.                    After obtaining informed consent, the colonoscope was                     passed under direct vision. Throughout the procedure, the                     patient's blood pressure, pulse, and oxygen saturations                     were monitored continuously. The Colonoscope was                     introduced through the anus and advanced to the the cecum,  identified by appendiceal orifice and ileocecal valve.  The                     colonoscopy was performed without difficulty. The patient                     tolerated the procedure well. The quality of the bowel                     preparation was excellent. Findings:      The perianal and digital rectal examinations were normal.      Non-bleeding internal hemorrhoids were found during retroflexion. The       hemorrhoids were Grade II (internal hemorrhoids that prolapse but reduce       spontaneously).      A 7 mm polyp was found in the ascending colon. The polyp was sessile.       The polyp was removed with a cold snare. Resection and retrieval were       complete.      Two sessile polyps were found in the sigmoid colon. The polyps were 5 to       6 mm in size. These polyps were removed with a cold snare. Resection and       retrieval were complete.      Two sessile polyps were found in the sigmoid colon. The polyps were 3 to       4 mm in size. These polyps were removed with a cold biopsy forceps.       Resection and retrieval were complete.      Multiple small-mouthed diverticula were found in the sigmoid colon. Impression:        - Non-bleeding internal hemorrhoids.                    - One 7 mm polyp in the ascending colon. Resected and                     retrieved.                    - Two 5 to 6 mm polyps in the sigmoid colon. Resected and                     retrieved.                    - Two 3 to 4 mm polyps in the sigmoid colon. Resected and                     retrieved.                    - Diverticulosis in the sigmoid colon. Recommendation:    - Await pathology results.                    - Repeat colonoscopy in 5 years if polyp adenoma and 10                     years if hyperplastic Procedure Code(s): --- Professional ---                    [phone], Colonoscopy, flexible; with removal of tumor(s),                     polyp(s),  or other lesion(s) by snare technique                    45380, 59, Colonoscopy, flexible; with  biopsy, single or                     multiple Diagnosis Code(s): --- Professional ---                    Z12.11, Encounter for screening for malignant neoplasm of                     colon                    D12.2, Benign neoplasm of ascending colon                    D12.5, Benign neoplasm of sigmoid colon CPT copyright 2014 American Medical Association. All rights reserved. The codes documented in this report are preliminary and upon coder review may  be revised to meet current compliance requirements. Lucilla Lame, MD 02/17/2015 9:16:18 AM This report has been signed electronically. Number of Addenda: 0 Note Initiated On: 02/17/2015 8:48 AM Scope Withdrawal Time: 0 hours 6 minutes 32 seconds  Total Procedure Duration: 0 hours 16 minutes 53 seconds       [facility]

## 2015-02-18 LAB — SURGICAL PATHOLOGY

## 2015-02-19 ENCOUNTER — Encounter: Payer: Self-pay | Admitting: Gastroenterology

## 2015-04-06 ENCOUNTER — Ambulatory Visit: Payer: 59 | Admitting: Family Medicine

## 2015-06-22 ENCOUNTER — Other Ambulatory Visit: Payer: Self-pay | Admitting: Family Medicine

## 2015-06-22 NOTE — Telephone Encounter (Signed)
Patient requesting refill. 

## 2015-06-30 ENCOUNTER — Encounter: Payer: Self-pay | Admitting: Family Medicine

## 2015-06-30 ENCOUNTER — Ambulatory Visit (INDEPENDENT_AMBULATORY_CARE_PROVIDER_SITE_OTHER): Payer: 59 | Admitting: Family Medicine

## 2015-06-30 VITALS — BP 134/88 | HR 71 | Temp 98.5°F | Resp 16 | Ht 67.0 in | Wt 318.3 lb

## 2015-06-30 DIAGNOSIS — E8881 Metabolic syndrome: Secondary | ICD-10-CM

## 2015-06-30 DIAGNOSIS — E559 Vitamin D deficiency, unspecified: Secondary | ICD-10-CM | POA: Diagnosis not present

## 2015-06-30 DIAGNOSIS — M25562 Pain in left knee: Secondary | ICD-10-CM

## 2015-06-30 DIAGNOSIS — E785 Hyperlipidemia, unspecified: Secondary | ICD-10-CM | POA: Diagnosis not present

## 2015-06-30 DIAGNOSIS — G4733 Obstructive sleep apnea (adult) (pediatric): Secondary | ICD-10-CM | POA: Diagnosis not present

## 2015-06-30 DIAGNOSIS — R252 Cramp and spasm: Secondary | ICD-10-CM | POA: Diagnosis not present

## 2015-06-30 DIAGNOSIS — M25561 Pain in right knee: Secondary | ICD-10-CM

## 2015-06-30 DIAGNOSIS — G4700 Insomnia, unspecified: Secondary | ICD-10-CM | POA: Diagnosis not present

## 2015-06-30 DIAGNOSIS — E876 Hypokalemia: Secondary | ICD-10-CM | POA: Diagnosis not present

## 2015-06-30 DIAGNOSIS — I10 Essential (primary) hypertension: Secondary | ICD-10-CM | POA: Diagnosis not present

## 2015-06-30 MED ORDER — ACETAMINOPHEN 500 MG PO TABS: 500.0000 mg | ORAL_TABLET | Freq: Three times a day (TID) | ORAL | Status: AC | PRN

## 2015-06-30 MED ORDER — TRAMADOL HCL 50 MG PO TABS: 50.0000 mg | ORAL_TABLET | Freq: Three times a day (TID) | ORAL | Status: DC | PRN

## 2015-06-30 MED ORDER — AMLODIPINE BESY-BENAZEPRIL HCL 5-20 MG PO CAPS: 1.0000 | ORAL_CAPSULE | Freq: Every day | ORAL | Status: DC

## 2015-06-30 MED ORDER — PHENTERMINE-TOPIRAMATE ER 11.25-69 MG PO CP24: 1.0000 | ORAL_CAPSULE | Freq: Every day | ORAL | Status: DC

## 2015-06-30 NOTE — Progress Notes (Signed)
Name: Emma Oconnor   MRN: [id number]    DOB: 08-10-1955   Date:06/30/2015       Progress Note  Subjective  Chief Complaint  Chief Complaint  Patient presents with  . Medication Refill  . Hypertension    patient has been doing well and has not had any negative sx.  . Hyperlipidemia  . Obesity    patient has been exercising and continues with the Qysmia  . Spasms    HPI  HTN: taking medication for bp as prescribed, no side effects, bp is at goal, no dizziness, no chest pain or palpitation  Hyperlipidemia: taking Atorvastatin as prescribed. Denies side effects of medications, no myalgia  Hypokalemia: she has episodes of severe cramps at times.   Insomnia: Trazodone helps her fall asleep but not stay asleep. Average 5 hours on fit bit, waking up sometimes because of knee pain  Obesity: she was 352.6 lbs in March 2015, she was started on Qsymia and lost over 5 % of original weight, however husband lost his job in [employer] 2015 and she was unable to afford medication. She is still going to the gym, but has gained weight, we resume medication at weight of 342 lbs in July and she is down to 315 lbs, but now is at 318 lbs. She is frustrated because her weight has plateau.  She is on Qsymia but not noticing a difference in her appetite, so she is taking every other day. She is also eating a 1580 calorie diet, and still going to the gym 4 days weekly.   OSA: wears CPAP but she has been skipping it at times because she does not want to be addicted to the machine, since it makes her wake up feeling better. Explained importance of compliance and risk of heart attacks and strokes if she does not use it.   Knee pain: bilateral pain, intermittently, aching like, around anterior knee, worse with activity, off Meloxicam but taking Tramadol prn, having difficulty getting in the pool secondary to sleeps steps, and would like a letter to be able to take side door and walk into the pool   Patient Active  Problem List   Diagnosis Date Noted  . Benign neoplasm of ascending colon   . Benign neoplasm of sigmoid colon   . Benign essential HTN 10/25/2014  . Bursitis of shoulder 10/25/2014  . Insomnia, persistent 10/25/2014  . Dyslipidemia 10/25/2014  . Female stress incontinence 10/25/2014  . Eczema intertrigo 10/25/2014  . Menopause 10/25/2014  . Dysmetabolic syndrome 0000000  . Extreme obesity ([place]) 10/25/2014  . Obstructive apnea 10/25/2014  . Vitamin D deficiency 10/25/2014  . Skin tag 10/25/2014  . Knee pain 10/25/2014    Past Surgical History  Procedure Laterality Date  . Tubal ligation    . Colonoscopy with propofol N/A 02/17/2015    Procedure: COLONOSCOPY WITH PROPOFOL;  Surgeon: Lucilla Lame, MD;  Location: [facility] ENDOSCOPY;  Service: Endoscopy;  Laterality: N/A;    Family History  Problem Relation Age of Onset  . Diabetes Mother   . Hypertension Mother   . Hyperlipidemia Mother   . Multiple sclerosis Brother   . Diabetes Brother     Social History   Social History  . Marital Status: Married    Spouse Name: N/A  . Number of Children: N/A  . Years of Education: N/A   Occupational History  . Not on file.   Social History Main Topics  . Smoking status: Former Smoker -- 0.50 packs/day  for 30 years    Types: Cigarettes    Start date: 04/19/1975    Quit date: 12/17/2005  . Smokeless tobacco: Never Used  . Alcohol Use: No  . Drug Use: No  . Sexual Activity: Not Currently   Other Topics Concern  . Not on file   Social History Narrative     Current outpatient prescriptions:  .  amLODipine-benazepril (LOTREL) 5-20 MG capsule, Take 1 capsule by mouth daily., Disp: 90 capsule, Rfl: 1 .  aspirin EC 81 MG tablet, Take 1 tablet (81 mg total) by mouth daily., Disp: 30 tablet, Rfl: 0 .  atenolol-chlorthalidone (TENORETIC) 50-25 MG tablet, Take 1 tablet by mouth  daily, Disp: 90 tablet, Rfl: 0 .  atorvastatin (LIPITOR) 40 MG tablet, Take 1 tablet by mouth  daily at  6pm, Disp: 90 tablet, Rfl: 0 .  traMADol (ULTRAM) 50 MG tablet, Take 1 tablet (50 mg total) by mouth every 8 (eight) hours as needed., Disp: 90 tablet, Rfl: 1 .  traZODone (DESYREL) 100 MG tablet, Take 1 tablet by mouth at  bedtime, Disp: 90 tablet, Rfl: 0 .  acetaminophen (TYLENOL) 500 MG tablet, Take 1 tablet (500 mg total) by mouth every 8 (eight) hours as needed., Disp: 90 tablet, Rfl: 0 .  Phentermine-Topiramate (QSYMIA) 11.25-69 MG CP24, Take 1 capsule by mouth daily., Disp: 30 capsule, Rfl: 2  No Known Allergies   ROS  Constitutional: Negative for fever or significant  weight change.  Respiratory: Negative for cough and shortness of breath.   Cardiovascular: Negative for chest pain or palpitations.  Gastrointestinal: Negative for abdominal pain, no bowel changes.  Musculoskeletal: Positive for gait problem and intermittent  joint swelling.  Skin: Negative for rash.  Neurological: Negative for dizziness or headache.  No other specific complaints in a complete review of systems (except as listed in HPI above).  Objective  Filed Vitals:   06/30/15 1346  BP: 134/88  Pulse: 71  Temp: 98.5 F (36.9 C)  TempSrc: Oral  Resp: 16  Height: 5\' 7"  (1.702 m)  Weight: 318 lb 4.8 oz (144.38 kg)  SpO2: 97%    Body mass index is 49.84 kg/(m^2).  Physical Exam  Constitutional: Patient appears well-developed and well-nourished. Obese No distress.  HEENT: head atraumatic, normocephalic, pupils equal and reactive to light, neck supple, throat within normal limits Cardiovascular: Normal rate, regular rhythm and normal heart sounds.  No murmur heard. No BLE edema. Pulmonary/Chest: Effort normal and breath sounds normal. No respiratory distress. Abdominal: Soft.  There is no tenderness. Psychiatric: Patient has a normal mood and affect. behavior is normal. Judgment and thought content normal. Muscular Skeletal: crepitus with extension of both knees  PHQ2/9: Depression screen [facility] 2/9  06/30/2015 01/27/2015 10/27/2014  Decreased Interest 0 0 0  Down, Depressed, Hopeless 0 0 0  PHQ - 2 Score 0 0 0     Fall Risk: Fall Risk  06/30/2015 01/27/2015 10/27/2014  Falls in the past year? No No No     Functional Status Survey: Is the patient deaf or have difficulty hearing?: No Does the patient have difficulty seeing, even when wearing glasses/contacts?: No Does the patient have difficulty concentrating, remembering, or making decisions?: No Does the patient have difficulty walking or climbing stairs?: No Does the patient have difficulty dressing or bathing?: No Does the patient have difficulty doing errands alone such as visiting a doctor's office or shopping?: No    Assessment & Plan  1. Benign essential HTN  - amLODipine-benazepril (LOTREL)  5-20 MG capsule; Take 1 capsule by mouth daily.  Dispense: 90 capsule; Refill: 1  2. Dyslipidemia  Check labs during her physical   3. Obstructive apnea  Using CPAP every night  4. Insomnia, persistent  Doing well on medication   5. Morbid obesity due to excess calories (HCC)  - Phentermine-Topiramate (QSYMIA) 11.25-69 MG CP24; Take 1 capsule by mouth daily.  Dispense: 30 capsule; Refill: 2  6. Dysmetabolic syndrome  - Hemoglobin A1c  7. Arthralgia of both knees  - traMADol (ULTRAM) 50 MG tablet; Take 1 tablet (50 mg total) by mouth every 8 (eight) hours as needed.  Dispense: 90 tablet; Refill: 1 - acetaminophen (TYLENOL) 500 MG tablet; Take 1 tablet (500 mg total) by mouth every 8 (eight) hours as needed.  Dispense: 90 tablet; Refill: 0  8. Hypokalemia  - Potassium  9. Vitamin D deficiency  - VITAMIN D 25 Hydroxy (Vit-D Deficiency, Fractures)  10. Muscle cramping  - Magnesium

## 2015-07-01 LAB — HEMOGLOBIN A1C
Est. average glucose Bld gHb Est-mCnc: 120 mg/dL
Hgb A1c MFr Bld: 5.8 % — ABNORMAL HIGH (ref 4.8–5.6)

## 2015-07-01 LAB — MAGNESIUM: MAGNESIUM: 2.1 mg/dL (ref 1.6–2.3)

## 2015-07-01 LAB — VITAMIN D 25 HYDROXY (VIT D DEFICIENCY, FRACTURES): VIT D 25 HYDROXY: 31.7 ng/mL (ref 30.0–100.0)

## 2015-07-01 LAB — POTASSIUM: Potassium: 3.6 mmol/L (ref 3.5–5.2)

## 2015-08-04 ENCOUNTER — Other Ambulatory Visit: Payer: Self-pay

## 2015-08-04 DIAGNOSIS — M25562 Pain in left knee: Principal | ICD-10-CM

## 2015-08-04 DIAGNOSIS — M25561 Pain in right knee: Secondary | ICD-10-CM

## 2015-08-04 NOTE — Telephone Encounter (Signed)
Got a fax from [facility] requesting a refill of this patient's Ultram.  Refill request was sent to Dr. Steele Sizer for approval and submission.

## 2015-08-05 MED ORDER — TRAMADOL HCL 50 MG PO TABS: 50.0000 mg | ORAL_TABLET | Freq: Three times a day (TID) | ORAL | Status: DC | PRN

## 2015-09-22 ENCOUNTER — Other Ambulatory Visit: Payer: Self-pay | Admitting: Family Medicine

## 2015-09-22 NOTE — Telephone Encounter (Signed)
Patient requesting refill. 

## 2015-09-30 ENCOUNTER — Ambulatory Visit: Payer: 59 | Admitting: Family Medicine

## 2015-10-01 ENCOUNTER — Ambulatory Visit (INDEPENDENT_AMBULATORY_CARE_PROVIDER_SITE_OTHER): Payer: 59 | Admitting: Family Medicine

## 2015-10-01 ENCOUNTER — Encounter: Payer: Self-pay | Admitting: Family Medicine

## 2015-10-01 VITALS — BP 128/76 | HR 88 | Temp 98.6°F | Resp 18 | Ht 67.0 in | Wt 309.8 lb

## 2015-10-01 DIAGNOSIS — E8881 Metabolic syndrome: Secondary | ICD-10-CM | POA: Diagnosis not present

## 2015-10-01 DIAGNOSIS — I10 Essential (primary) hypertension: Secondary | ICD-10-CM | POA: Diagnosis not present

## 2015-10-01 DIAGNOSIS — R8290 Unspecified abnormal findings in urine: Secondary | ICD-10-CM

## 2015-10-01 DIAGNOSIS — M25561 Pain in right knee: Secondary | ICD-10-CM | POA: Diagnosis not present

## 2015-10-01 DIAGNOSIS — G4733 Obstructive sleep apnea (adult) (pediatric): Secondary | ICD-10-CM | POA: Diagnosis not present

## 2015-10-01 DIAGNOSIS — M5441 Lumbago with sciatica, right side: Secondary | ICD-10-CM

## 2015-10-01 DIAGNOSIS — M25562 Pain in left knee: Secondary | ICD-10-CM | POA: Diagnosis not present

## 2015-10-01 DIAGNOSIS — G4700 Insomnia, unspecified: Secondary | ICD-10-CM

## 2015-10-01 DIAGNOSIS — E785 Hyperlipidemia, unspecified: Secondary | ICD-10-CM | POA: Diagnosis not present

## 2015-10-01 LAB — POCT URINALYSIS DIPSTICK
BILIRUBIN UA: NEGATIVE
Blood, UA: NEGATIVE
GLUCOSE UA: NEGATIVE
Ketones, UA: NEGATIVE
NITRITE UA: POSITIVE
PH UA: 6
Spec Grav, UA: 1.015
UROBILINOGEN UA: 2

## 2015-10-01 MED ORDER — ATENOLOL-CHLORTHALIDONE 50-25 MG PO TABS: ORAL_TABLET | ORAL | Status: DC

## 2015-10-01 MED ORDER — PHENTERMINE-TOPIRAMATE ER 11.25-69 MG PO CP24: 1.0000 | ORAL_CAPSULE | Freq: Every day | ORAL | Status: DC

## 2015-10-01 MED ORDER — ATORVASTATIN CALCIUM 40 MG PO TABS: ORAL_TABLET | ORAL | Status: DC

## 2015-10-01 MED ORDER — TRAZODONE HCL 100 MG PO TABS: ORAL_TABLET | ORAL | Status: DC

## 2015-10-01 MED ORDER — NAPROXEN 500 MG PO TABS: 500.0000 mg | ORAL_TABLET | Freq: Two times a day (BID) | ORAL | Status: DC

## 2015-10-01 MED ORDER — CIPROFLOXACIN HCL 250 MG PO TABS: 250.0000 mg | ORAL_TABLET | Freq: Two times a day (BID) | ORAL | Status: DC

## 2015-10-01 MED ORDER — TRAMADOL HCL 50 MG PO TABS: 50.0000 mg | ORAL_TABLET | Freq: Three times a day (TID) | ORAL | Status: DC | PRN

## 2015-10-01 NOTE — Progress Notes (Addendum)
Name: Emma Oconnor   MRN: [id number]    DOB: 21-Nov-1955   Date:10/01/2015       Progress Note  Subjective  Chief Complaint  Chief Complaint  Patient presents with  . Medication Refill    3 month F/U   . Hypertension  . Hyperlipidemia    Muscle cramps  . Obesity    Patient has lost 9 pounds due to medication and working out 5  . Knee Pain    Stable with medication and ice  . Insomnia    Depends on patient muscles aches how she sleeps, trouble staying asleep- sleeps 5-6 hours nightly    HPI  HTN: taking medication for bp as prescribed most of the time, but she skipped last nights dose and this morning's dose, no side effects, bp is at goal, no orthostatic changes, no chest pain or palpitation. Advised to take half of Atenolol chlorthalidone and monitor  Hyperlipidemia: taking Atorvastatin as prescribed. Denies side effects of medications, no myalgia, we will recheck labs next visit   Hypokalemia: she has episodes of severe cramps at times.   Insomnia: Trazodone helps her fall asleep but not stay asleep, she states she wakes up because of pain sometimes - she has to use a pillow between her knees. . Average 5 hours on fit bit, but she can sleep 8 hours when not in pain.   Obesity: she was 352.6 lbs in March 2015, she was started on Qsymia and lost over 5 % of original weight, she had a gap because her husband lost his job Dec 2015. Resumed medication at weight of 342 lbs in July 2016 . She is also eating a 1580 calorie diet, but stopped going to the gym because of back pain and right leg pain, she still lost 9 lbs since last visit. Current weight of 309.8 lbs   OSA: wears CPAP every night now.  She wakes up feeling rested when she is able to sleep all night.   Knee pain: bilateral pain, intermittently, aching like, around anterior knee, worse with activity,  taking Tramadol prn, she is using back entrance for the pool to avoid steps  Right low back pain with radiculitis: symptoms  started 2 weeks,  she noticed acute pain on right lower back that radiates to right outer hip, pain is described as aching and burning, intermittent, no pain when sitting, but present when sleeping or with movement. Sometimes she limps because of pain, when very stiff when she first stands up. She has been taking Ibuprofen otc and pain has improved a little. Pain at this time zero sitting, but can go up to 10/10 depending on activity level   Urine odor: she has noticed a strong urine odor, no dysuria, no frequency.    Patient Active Problem List   Diagnosis Date Noted  . Benign neoplasm of ascending colon   . Benign neoplasm of sigmoid colon   . Benign essential HTN 10/25/2014  . Bursitis of shoulder 10/25/2014  . Insomnia, persistent 10/25/2014  . Dyslipidemia 10/25/2014  . Female stress incontinence 10/25/2014  . Eczema intertrigo 10/25/2014  . Menopause 10/25/2014  . Dysmetabolic syndrome 0000000  . Extreme obesity ([place]) 10/25/2014  . Obstructive apnea 10/25/2014  . Vitamin D deficiency 10/25/2014  . Skin tag 10/25/2014  . Knee pain 10/25/2014    Past Surgical History  Procedure Laterality Date  . Tubal ligation    . Colonoscopy with propofol N/A 02/17/2015    Procedure: COLONOSCOPY WITH PROPOFOL;  Surgeon: Lucilla Lame, MD;  Location: [facility] ENDOSCOPY;  Service: Endoscopy;  Laterality: N/A;    Family History  Problem Relation Age of Onset  . Diabetes Mother   . Hypertension Mother   . Hyperlipidemia Mother   . Multiple sclerosis Brother   . Diabetes Brother     Social History   Social History  . Marital Status: Married    Spouse Name: N/A  . Number of Children: N/A  . Years of Education: N/A   Occupational History  . Not on file.   Social History Main Topics  . Smoking status: Former Smoker -- 0.50 packs/day for 30 years    Types: Cigarettes    Start date: 04/19/1975    Quit date: 12/17/2005  . Smokeless tobacco: Never Used  . Alcohol Use: No  . Drug Use:  No  . Sexual Activity: Not Currently   Other Topics Concern  . Not on file   Social History Narrative     Current outpatient prescriptions:  .  acetaminophen (TYLENOL) 500 MG tablet, Take 1 tablet (500 mg total) by mouth every 8 (eight) hours as needed., Disp: 90 tablet, Rfl: 0 .  amLODipine-benazepril (LOTREL) 5-20 MG capsule, Take 1 capsule by mouth daily., Disp: 90 capsule, Rfl: 1 .  aspirin EC 81 MG tablet, Take 1 tablet (81 mg total) by mouth daily., Disp: 30 tablet, Rfl: 0 .  atenolol-chlorthalidone (TENORETIC) 50-25 MG tablet, Take 1 tablet by mouth  daily, Disp: 90 tablet, Rfl: 0 .  atorvastatin (LIPITOR) 40 MG tablet, Take 1 tablet by mouth  daily at 6pm, Disp: 90 tablet, Rfl: 0 .  Phentermine-Topiramate (QSYMIA) 11.25-69 MG CP24, Take 1 capsule by mouth daily., Disp: 30 capsule, Rfl: 2 .  traMADol (ULTRAM) 50 MG tablet, Take 1 tablet (50 mg total) by mouth every 8 (eight) hours as needed., Disp: 90 tablet, Rfl: 0 .  traZODone (DESYREL) 100 MG tablet, Take 1 tablet by mouth at  bedtime, Disp: 90 tablet, Rfl: 0 .  ciprofloxacin (CIPRO) 250 MG tablet, Take 1 tablet (250 mg total) by mouth 2 (two) times daily., Disp: 6 tablet, Rfl: 0 .  naproxen (NAPROSYN) 500 MG tablet, Take 1 tablet (500 mg total) by mouth 2 (two) times daily with a meal., Disp: 60 tablet, Rfl: 0  No Known Allergies   ROS  Constitutional: Negative for fever , positive for weight change.  Respiratory: Negative for cough and shortness of breath.   Cardiovascular: Negative for chest pain or palpitations.  Gastrointestinal: Negative for abdominal pain, no bowel changes.  Musculoskeletal: Positivefor gait problem and joint swelling.  Skin: Negative for rash.  Neurological: Negative for dizziness or headache.  No other specific complaints in a complete review of systems (except as listed in HPI above). Objective  Filed Vitals:   10/01/15 0858  BP: 128/76  Pulse: 88  Temp: 98.6 F (37 C)  TempSrc: Oral   Resp: 18  Height: 5\' 7"  (1.702 m)  Weight: 309 lb 12.8 oz (140.524 kg)  SpO2: 96%    Body mass index is 48.51 kg/(m^2).  Physical Exam  Constitutional: Patient appears well-developed and well-nourished. Obese  No distress.  HEENT: head atraumatic, normocephalic, pupils equal and reactive to light,  neck supple, throat within normal limits Cardiovascular: Normal rate, regular rhythm and normal heart sounds.  No murmur heard. No BLE edema. Pulmonary/Chest: Effort normal and breath sounds normal. No respiratory distress. Abdominal: Soft.  There is no tenderness. Psychiatric: Patient has a normal mood and affect.  behavior is normal. Judgment and thought content normal. Muscular Skeletal: crepitus with extension of both knees, pain during palpation of right lower back, negative straight leg raise, difficulty localizing pain - hurts all over right outer hip and lower back, decrease in flexion, normal extension , but pain with right lateral bending, no rashes.   PHQ2/9: Depression screen [facility] 2/9 10/01/2015 06/30/2015 01/27/2015 10/27/2014  Decreased Interest 0 0 0 0  Down, Depressed, Hopeless 0 0 0 0  PHQ - 2 Score 0 0 0 0     Fall Risk: Fall Risk  10/01/2015 06/30/2015 01/27/2015 10/27/2014  Falls in the past year? No No No No     Functional Status Survey: Is the patient deaf or have difficulty hearing?: No Does the patient have difficulty seeing, even when wearing glasses/contacts?: No Does the patient have difficulty concentrating, remembering, or making decisions?: No Does the patient have difficulty walking or climbing stairs?: No Does the patient have difficulty dressing or bathing?: No Does the patient have difficulty doing errands alone such as visiting a doctor's office or shopping?: No    Assessment & Plan  1. Benign essential HTN  - atenolol-chlorthalidone (TENORETIC) 50-25 MG tablet; Take 1 tablet by mouth  daily  Dispense: 90 tablet; Refill: 0  2. Dyslipidemia  -  atorvastatin (LIPITOR) 40 MG tablet; Take 1 tablet by mouth  daily at 6pm  Dispense: 90 tablet; Refill: 0  3. Obstructive apnea  Continue CPAP   4. Insomnia, persistent  - traZODone (DESYREL) 100 MG tablet; Take 1 tablet by mouth at  bedtime  Dispense: 90 tablet; Refill: 0  5. Morbid obesity due to excess calories (HCC)  - Phentermine-Topiramate (QSYMIA) 11.25-69 MG CP24; Take 1 capsule by mouth daily.  Dispense: 30 capsule; Refill: 2  6. Dysmetabolic syndrome  Losing weight and doing well   7. Arthralgia of both knees  - Ambulatory referral to Chiropractic - traMADol (ULTRAM) 50 MG tablet; Take 1 tablet (50 mg total) by mouth every 8 (eight) hours as needed.  Dispense: 90 tablet; Refill: 0  8. Right-sided low back pain with right-sided sciatica  - Ambulatory referral to Chiropractic - naproxen (NAPROSYN) 500 MG tablet; Take 1 tablet (500 mg total) by mouth 2 (two) times daily with a meal.  Dispense: 60 tablet; Refill: 0  9. Abnormal urine odor  - Urine culture - POCT Urinalysis Dipstick We will start Cipro today since she has nitrates on UTI

## 2015-10-01 NOTE — Addendum Note (Signed)
Addended by: Steele Sizer F on: 10/01/2015 12:29 PM   Modules accepted: Orders

## 2015-10-02 ENCOUNTER — Telehealth: Payer: Self-pay | Admitting: Family Medicine

## 2015-10-02 MED ORDER — PHENTERMINE-TOPIRAMATE ER 11.25-69 MG PO CP24: 1.0000 | ORAL_CAPSULE | Freq: Every day | ORAL | Status: DC

## 2015-10-02 NOTE — Telephone Encounter (Signed)
PER DR REQUEST SEND HER A MESSAGE THAT PT SAID THAT SHE NEEDS THE RX FOR QYSMIA AND THAT YOU SENT IT TO [facility] AND IT SHOULD NOT HAVE BEEN. CALL HER WHEN READY FOR PICK UP.

## 2015-10-02 NOTE — Telephone Encounter (Signed)
done

## 2015-10-03 LAB — URINE CULTURE

## 2015-10-08 ENCOUNTER — Other Ambulatory Visit: Payer: Self-pay | Admitting: Family Medicine

## 2015-11-23 ENCOUNTER — Telehealth: Payer: Self-pay

## 2015-11-23 ENCOUNTER — Other Ambulatory Visit: Payer: Self-pay | Admitting: Family Medicine

## 2015-11-23 DIAGNOSIS — E785 Hyperlipidemia, unspecified: Secondary | ICD-10-CM

## 2015-11-23 DIAGNOSIS — I10 Essential (primary) hypertension: Secondary | ICD-10-CM

## 2015-11-23 DIAGNOSIS — Z01419 Encounter for gynecological examination (general) (routine) without abnormal findings: Secondary | ICD-10-CM

## 2015-11-23 DIAGNOSIS — E559 Vitamin D deficiency, unspecified: Secondary | ICD-10-CM

## 2015-11-23 DIAGNOSIS — E8881 Metabolic syndrome: Secondary | ICD-10-CM

## 2015-11-23 NOTE — Telephone Encounter (Signed)
Patient is coming in on December 08, 2015 for her CPE and will need blood work for [employer].

## 2015-12-08 ENCOUNTER — Ambulatory Visit (INDEPENDENT_AMBULATORY_CARE_PROVIDER_SITE_OTHER): Payer: 59 | Admitting: Family Medicine

## 2015-12-08 ENCOUNTER — Encounter: Payer: Self-pay | Admitting: Family Medicine

## 2015-12-08 DIAGNOSIS — Z1239 Encounter for other screening for malignant neoplasm of breast: Secondary | ICD-10-CM

## 2015-12-08 DIAGNOSIS — Z01419 Encounter for gynecological examination (general) (routine) without abnormal findings: Secondary | ICD-10-CM

## 2015-12-08 DIAGNOSIS — Z0000 Encounter for general adult medical examination without abnormal findings: Secondary | ICD-10-CM

## 2015-12-08 MED ORDER — NAPROXEN 500 MG PO TABS: 500.0000 mg | ORAL_TABLET | Freq: Two times a day (BID) | ORAL | 0 refills | Status: DC | PRN

## 2015-12-08 NOTE — Progress Notes (Signed)
Name: Emma Oconnor   MRN: [id number]    DOB: 1955-08-06   Date:12/08/2015       Progress Note  Subjective  Chief Complaint  Chief Complaint  Patient presents with  . Annual Exam    HPI    Well Woman: she is due for a mammogram, had diagnostic back in 2015. She has been taking Qysmia since 10/2015 and has lost 32 lbs since that time. She has not been able to lose weight recently because of back problems - seeing chiropractor and will resume when cleared. Discussed adding Victoza but she would like to hold off for now. Denies breast problems, not sexually active for many years, no vaginal problems, normal pap in 2016. She has episodes of hot flashes but does not want medication. Stress incontinence has improved with weight loss.    Patient Active Problem List   Diagnosis Date Noted  . Benign neoplasm of ascending colon   . Benign neoplasm of sigmoid colon   . Benign essential HTN 10/25/2014  . Bursitis of shoulder 10/25/2014  . Insomnia, persistent 10/25/2014  . Dyslipidemia 10/25/2014  . Female stress incontinence 10/25/2014  . Eczema intertrigo 10/25/2014  . Menopause 10/25/2014  . Dysmetabolic syndrome 0000000  . Extreme obesity ([place]) 10/25/2014  . Obstructive apnea 10/25/2014  . Vitamin D deficiency 10/25/2014  . Skin tag 10/25/2014  . Knee pain 10/25/2014    Past Surgical History:  Procedure Laterality Date  . COLONOSCOPY WITH PROPOFOL N/A 02/17/2015   Procedure: COLONOSCOPY WITH PROPOFOL;  Surgeon: Lucilla Lame, MD;  Location: [facility] ENDOSCOPY;  Service: Endoscopy;  Laterality: N/A;  . TUBAL LIGATION      Family History  Problem Relation Age of Onset  . Diabetes Mother   . Hypertension Mother   . Hyperlipidemia Mother   . Multiple sclerosis Brother   . Diabetes Brother     Social History   Social History  . Marital status: Married    Spouse name: N/A  . Number of children: N/A  . Years of education: N/A   Occupational History  . Not on file.   Social  History Main Topics  . Smoking status: Former Smoker    Packs/day: 0.50    Years: 30.00    Types: Cigarettes    Start date: 04/19/1975    Quit date: 12/17/2005  . Smokeless tobacco: Never Used  . Alcohol use No  . Drug use: No  . Sexual activity: Not Currently   Other Topics Concern  . Not on file   Social History Narrative  . No narrative on file     Current Outpatient Prescriptions:  .  acetaminophen (TYLENOL) 500 MG tablet, Take 1 tablet (500 mg total) by mouth every 8 (eight) hours as needed., Disp: 90 tablet, Rfl: 0 .  amLODipine-benazepril (LOTREL) 5-20 MG capsule, Take 1 capsule by mouth daily., Disp: 90 capsule, Rfl: 1 .  aspirin EC 81 MG tablet, Take 1 tablet (81 mg total) by mouth daily., Disp: 30 tablet, Rfl: 0 .  atenolol-chlorthalidone (TENORETIC) 50-25 MG tablet, Take 1 tablet by mouth  daily, Disp: 90 tablet, Rfl: 0 .  atorvastatin (LIPITOR) 40 MG tablet, Take 1 tablet by mouth  daily at 6pm, Disp: 90 tablet, Rfl: 0 .  naproxen (NAPROSYN) 500 MG tablet, Take 1 tablet (500 mg total) by mouth 2 (two) times daily as needed., Disp: 60 tablet, Rfl: 0 .  Phentermine-Topiramate (QSYMIA) 11.25-69 MG CP24, Take 1 capsule by mouth daily., Disp: 30 capsule, Rfl: 2 .  traMADol (ULTRAM) 50 MG tablet, Take 1 tablet (50 mg total) by mouth every 8 (eight) hours as needed., Disp: 90 tablet, Rfl: 0 .  traZODone (DESYREL) 100 MG tablet, Take 1 tablet by mouth at  bedtime, Disp: 90 tablet, Rfl: 0  No Known Allergies   ROS  Constitutional: Negative for fever or weight change.  Respiratory: Negative for cough and shortness of breath.   Cardiovascular: Negative for chest pain or palpitations.  Gastrointestinal: Negative for abdominal pain, no bowel changes.  Musculoskeletal: Negative for gait problem or joint swelling.  Skin: Negative for rash.  Neurological: Negative for dizziness or headache.  No other specific complaints in a complete review of systems (except as listed in HPI  above).  Objective  Vitals:   12/08/15 1155  BP: 122/72  Pulse: 66  Resp: 18  Temp: 97.9 F (36.6 C)  SpO2: 95%  Weight: (!) 310 lb 6 oz (140.8 kg)  Height: 5\' 7"  (1.702 m)    Body mass index is 48.61 kg/m.  Physical Exam  Constitutional: Patient appears well-developed and ovese  No distress.  HENT: Head: Normocephalic and atraumatic. Ears: B TMs ok, no erythema or effusion; Nose: Nose normal. Mouth/Throat: Oropharynx is clear and moist. No oropharyngeal exudate.  Eyes: Conjunctivae and EOM are normal. Pupils are equal, round, and reactive to light. No scleral icterus.  Neck: Normal range of motion. Neck supple. No JVD present. No thyromegaly present.  Cardiovascular: Normal rate, regular rhythm and normal heart sounds.  No murmur heard. No BLE edema. Pulmonary/Chest: Effort normal and breath sounds normal. No respiratory distress. Abdominal: Soft. Bowel sounds are normal, no distension. There is no tenderness. no masses Breast: no lumps or masses, no nipple discharge or rashes FEMALE GENITALIA:  External genitalia normal Pelvic not done RECTAL: not done Musculoskeletal: Normal range of motion, no joint effusions. No gross deformities Neurological: he is alert and oriented to person, place, and time. No cranial nerve deficit. Coordination, balance, strength, speech and gait are normal.  Skin: Skin is warm and dry. No rash noted. No erythema. Dark spot on left leg, per patient present for many years - no changes Psychiatric: Patient has a normal mood and affect. behavior is normal. Judgment and thought content normal.  Recent Results (from the past 2160 hour(s))  Urine culture     Status: Abnormal   Collection Time: 10/01/15 12:00 AM  Result Value Ref Range   Urine Culture, Routine Final report (A)    Urine Culture result 1 Escherichia coli (A)     Comment: Greater than 100,000 colony forming units per mL   ANTIMICROBIAL SUSCEPTIBILITY Comment     Comment:       ** S =  Susceptible; I = Intermediate; R = Resistant **                    P = Positive; N = Negative             MICS are expressed in micrograms per mL    Antibiotic                 RSLT#1    RSLT#2    RSLT#3    RSLT#4 Amoxicillin/Clavulanic Acid    S Ampicillin                     S Cefepime  S Ceftriaxone                    S Cefuroxime                     S Cephalothin                    I Ciprofloxacin                  S Ertapenem                      S Gentamicin                     S Imipenem                       S Levofloxacin                   S Nitrofurantoin                 S Piperacillin                   S Tetracycline                   S Tobramycin                     S Trimethoprim/Sulfa             S   POCT Urinalysis Dipstick     Status: Abnormal   Collection Time: 10/01/15  9:44 AM  Result Value Ref Range   Color, UA dark yellow    Clarity, UA cloudy    Glucose, UA neg    Bilirubin, UA neg    Ketones, UA neg    Spec Grav, UA 1.015    Blood, UA neg    pH, UA 6.0    Protein, UA trace    Urobilinogen, UA 2.0    Nitrite, UA Positive    Leukocytes, UA Trace (A) Negative      PHQ2/9: Depression screen [facility] 2/9 12/08/2015 10/01/2015 06/30/2015 01/27/2015 10/27/2014  Decreased Interest 0 0 0 0 0  Down, Depressed, Hopeless 0 0 0 0 0  PHQ - 2 Score 0 0 0 0 0    Fall Risk: Fall Risk  12/08/2015 10/01/2015 06/30/2015 01/27/2015 10/27/2014  Falls in the past year? No No No No No     Functional Status Survey: Is the patient deaf or have difficulty hearing?: No Does the patient have difficulty seeing, even when wearing glasses/contacts?: No Does the patient have difficulty concentrating, remembering, or making decisions?: No Does the patient have difficulty walking or climbing stairs?: No Does the patient have difficulty dressing or bathing?: No Does the patient have difficulty doing errands alone such as visiting a doctor's office or shopping?:  No   Assessment & Plan  1. Well woman exam  Discussed importance of 150 minutes of physical activity weekly, eat two servings of fish weekly, eat one serving of tree nuts ( cashews, pistachios, pecans, almonds.Marland Kitchen) every other day, eat 6 servings of fruit/vegetables daily and drink plenty of water and avoid sweet beverages.  - Hemoglobin A1c - Lipid panel - Comprehensive metabolic panel - VITAMIN D 25 Hydroxy (Vit-D Deficiency, Fractures) - Vitamin B12  2. Morbid obesity due to excess calories ([place])  Continue Qysmia, we will fill forms  for work when she faxes it to me  3. Breast cancer screening  - MM Digital Screening; Future

## 2015-12-09 LAB — LIPID PANEL
Chol/HDL Ratio: 2.5 ratio units (ref 0.0–4.4)
Cholesterol, Total: 161 mg/dL (ref 100–199)
HDL: 64 mg/dL (ref >39)
LDL CALC: 81 mg/dL (ref 0–99)
TRIGLYCERIDES: 79 mg/dL (ref 0–149)
VLDL Cholesterol Cal: 16 mg/dL (ref 5–40)

## 2015-12-09 LAB — COMPREHENSIVE METABOLIC PANEL
A/G RATIO: 1.6 (ref 1.2–2.2)
ALBUMIN: 4.2 g/dL (ref 3.5–5.5)
ALT: 14 IU/L (ref 0–32)
AST: 12 IU/L (ref 0–40)
Alkaline Phosphatase: 109 IU/L (ref 39–117)
BUN / CREAT RATIO: 17 (ref 9–23)
BUN: 12 mg/dL (ref 6–24)
Bilirubin Total: 0.4 mg/dL (ref 0.0–1.2)
CALCIUM: 9.5 mg/dL (ref 8.7–10.2)
CO2: 25 mmol/L (ref 18–29)
CREATININE: 0.72 mg/dL (ref 0.57–1.00)
Chloride: 102 mmol/L (ref 96–106)
GFR, EST AFRICAN AMERICAN: 107 mL/min/{1.73_m2} (ref >59)
GFR, EST NON AFRICAN AMERICAN: 93 mL/min/{1.73_m2} (ref >59)
GLOBULIN, TOTAL: 2.6 g/dL (ref 1.5–4.5)
Glucose: 88 mg/dL (ref 65–99)
POTASSIUM: 3.5 mmol/L (ref 3.5–5.2)
SODIUM: 142 mmol/L (ref 134–144)
TOTAL PROTEIN: 6.8 g/dL (ref 6.0–8.5)

## 2015-12-09 LAB — VITAMIN B12: VITAMIN B 12: 1341 pg/mL — AB (ref 211–946)

## 2015-12-09 LAB — HEMOGLOBIN A1C
Est. average glucose Bld gHb Est-mCnc: 120 mg/dL
Hgb A1c MFr Bld: 5.8 % — ABNORMAL HIGH (ref 4.8–5.6)

## 2015-12-09 LAB — VITAMIN D 25 HYDROXY (VIT D DEFICIENCY, FRACTURES): Vit D, 25-Hydroxy: 31.5 ng/mL (ref 30.0–100.0)

## 2015-12-15 ENCOUNTER — Other Ambulatory Visit: Payer: Self-pay | Admitting: Family Medicine

## 2015-12-25 ENCOUNTER — Other Ambulatory Visit: Payer: Self-pay | Admitting: Family Medicine

## 2015-12-25 DIAGNOSIS — G4700 Insomnia, unspecified: Secondary | ICD-10-CM

## 2015-12-25 NOTE — Telephone Encounter (Signed)
Patient requesting refill of Trazodone be sent to [facility].

## 2016-01-01 ENCOUNTER — Ambulatory Visit: Payer: 59 | Admitting: Family Medicine

## 2016-01-08 ENCOUNTER — Other Ambulatory Visit: Payer: Self-pay | Admitting: Family Medicine

## 2016-01-08 DIAGNOSIS — E785 Hyperlipidemia, unspecified: Secondary | ICD-10-CM

## 2016-02-02 ENCOUNTER — Ambulatory Visit
Admission: RE | Admit: 2016-02-02 | Discharge: 2016-02-02 | Disposition: A | Discharge status: Home / Self Care | Payer: 59 | Source: Ambulatory Visit | Attending: Family Medicine | Admitting: Family Medicine

## 2016-02-02 DIAGNOSIS — Z1231 Encounter for screening mammogram for malignant neoplasm of breast: Secondary | ICD-10-CM | POA: Insufficient documentation

## 2016-02-02 DIAGNOSIS — Z1239 Encounter for other screening for malignant neoplasm of breast: Secondary | ICD-10-CM

## 2016-02-05 ENCOUNTER — Ambulatory Visit: Payer: 59 | Admitting: Family Medicine

## 2016-02-18 ENCOUNTER — Encounter: Payer: Self-pay | Admitting: Family Medicine

## 2016-02-18 ENCOUNTER — Ambulatory Visit (INDEPENDENT_AMBULATORY_CARE_PROVIDER_SITE_OTHER): Payer: 59 | Admitting: Family Medicine

## 2016-02-18 VITALS — BP 116/68 | HR 64 | Temp 99.1°F | Resp 16 | Ht 67.0 in | Wt 323.2 lb

## 2016-02-18 DIAGNOSIS — F330 Major depressive disorder, recurrent, mild: Secondary | ICD-10-CM | POA: Diagnosis not present

## 2016-02-18 DIAGNOSIS — M722 Plantar fascial fibromatosis: Secondary | ICD-10-CM | POA: Diagnosis not present

## 2016-02-18 DIAGNOSIS — G4700 Insomnia, unspecified: Secondary | ICD-10-CM | POA: Diagnosis not present

## 2016-02-18 DIAGNOSIS — E8881 Metabolic syndrome: Secondary | ICD-10-CM

## 2016-02-18 DIAGNOSIS — M7061 Trochanteric bursitis, right hip: Secondary | ICD-10-CM

## 2016-02-18 DIAGNOSIS — I10 Essential (primary) hypertension: Secondary | ICD-10-CM | POA: Diagnosis not present

## 2016-02-18 DIAGNOSIS — G4733 Obstructive sleep apnea (adult) (pediatric): Secondary | ICD-10-CM | POA: Diagnosis not present

## 2016-02-18 DIAGNOSIS — E785 Hyperlipidemia, unspecified: Secondary | ICD-10-CM | POA: Diagnosis not present

## 2016-02-18 MED ORDER — ATENOLOL-CHLORTHALIDONE 50-25 MG PO TABS: ORAL_TABLET | ORAL | 1 refills | Status: DC

## 2016-02-18 MED ORDER — AMLODIPINE BESY-BENAZEPRIL HCL 5-20 MG PO CAPS: 1.0000 | ORAL_CAPSULE | Freq: Every day | ORAL | 1 refills | Status: DC

## 2016-02-18 MED ORDER — ATORVASTATIN CALCIUM 40 MG PO TABS: ORAL_TABLET | ORAL | 1 refills | Status: DC

## 2016-02-18 MED ORDER — VENLAFAXINE HCL ER 37.5 MG PO CP24: 37.5000 mg | ORAL_CAPSULE | Freq: Every day | ORAL | 0 refills | Status: DC

## 2016-02-18 NOTE — Progress Notes (Signed)
Name: Emma Oconnor   MRN: [id number]    DOB: 05-02-1955   Date:02/18/2016       Progress Note  Subjective  Chief Complaint  Chief Complaint  Patient presents with  . Hypertension    pt here for 3 month follow up  . Hyperlipidemia    pt taking medications     HPI  HTN: taking bp daily and denies side effects. No chest pain or palpitation.  Pre-diabetes: hgbA1C was 5.8%, she denies polydipsia or polyuria, she has episodes of polyphagia.   Hyperlipidemia: taking Atorvastatin as prescribed. Denies side effects of medications, no myalgia, reviewed lab work with her  Insomnia: Trazodone helps her fall asleep but not stay asleep, she states she wakes up because of pain sometimes - she has to use a pillow between her knees. Husband works is working out town, currently in [employer] and comes home when he can. She does not sleep well when he is not home  Obesity: she was 352.6 lbs in March 2015, she was started on Qsymia and lost over 5 % of original weight, she had a gap because her husband lost his job Dec 2015. Resumed medication at weight of 342 lbs in July 2016 . She states she stopped Qsymia because it stopped working, she felt depressed and was eating more.. Current weight is up at 323.3 lbs  OSA: wears CPAP every night now.  She wakes up feeling rested when she is able to sleep all night. She has gained weight   Right plantar fascitis: she has been seeing chiropractor and is getting better, but when she gets up in am pain is severe, symptoms improves when wearing tennis shoes but worse with work shoes.   Chronic trochanteric pain:  symptoms started about 4 months ago, she still has pain, described as burning sensation on right outer hip that wakes her up at times, she is going to chiropractor and is improving but still has symptoms.   Depression Major: she used to take medication many years ago. She states since Summer her husband has been living out of town, for his job, initially it  was nice, but she has been feeling very lonely now. Having occasional crying spells, feeling sad. She denies suicidal thoughts or ideation.   Patient Active Problem List   Diagnosis Date Noted  . Benign neoplasm of ascending colon   . Benign neoplasm of sigmoid colon   . Benign essential HTN 10/25/2014  . Bursitis of shoulder 10/25/2014  . Insomnia, persistent 10/25/2014  . Dyslipidemia 10/25/2014  . Female stress incontinence 10/25/2014  . Eczema intertrigo 10/25/2014  . Menopause 10/25/2014  . Dysmetabolic syndrome 0000000  . Extreme obesity ([place]) 10/25/2014  . Obstructive apnea 10/25/2014  . Vitamin D deficiency 10/25/2014  . Skin tag 10/25/2014  . Knee pain 10/25/2014    Past Surgical History:  Procedure Laterality Date  . COLONOSCOPY WITH PROPOFOL N/A 02/17/2015   Procedure: COLONOSCOPY WITH PROPOFOL;  Surgeon: Lucilla Lame, MD;  Location: [facility] ENDOSCOPY;  Service: Endoscopy;  Laterality: N/A;  . TUBAL LIGATION      Family History  Problem Relation Age of Onset  . Diabetes Mother   . Hypertension Mother   . Hyperlipidemia Mother   . Multiple sclerosis Brother   . Diabetes Brother     Social History   Social History  . Marital status: Married    Spouse name: N/A  . Number of children: N/A  . Years of education: N/A   Occupational History  .  Not on file.   Social History Main Topics  . Smoking status: Former Smoker    Packs/day: 0.50    Years: 30.00    Types: Cigarettes    Start date: 04/19/1975    Quit date: 12/17/2005  . Smokeless tobacco: Never Used  . Alcohol use No  . Drug use: No  . Sexual activity: Not Currently   Other Topics Concern  . Not on file   Social History Narrative  . No narrative on file     Current Outpatient Prescriptions:  .  acetaminophen (TYLENOL) 500 MG tablet, Take 1 tablet (500 mg total) by mouth every 8 (eight) hours as needed., Disp: 90 tablet, Rfl: 0 .  amLODipine-benazepril (LOTREL) 5-20 MG capsule, Take 1 capsule by  mouth daily., Disp: 90 capsule, Rfl: 1 .  aspirin EC 81 MG tablet, Take 1 tablet (81 mg total) by mouth daily., Disp: 30 tablet, Rfl: 0 .  atenolol-chlorthalidone (TENORETIC) 50-25 MG tablet, Take 1 tablet by mouth  daily, Disp: 90 tablet, Rfl: 1 .  atorvastatin (LIPITOR) 40 MG tablet, Take 1 tablet by mouth  daily at 6pm, Disp: 90 tablet, Rfl: 1 .  naproxen (NAPROSYN) 500 MG tablet, Take 1 tablet (500 mg total) by mouth 2 (two) times daily as needed., Disp: 60 tablet, Rfl: 0 .  traMADol (ULTRAM) 50 MG tablet, Take 1 tablet (50 mg total) by mouth every 8 (eight) hours as needed., Disp: 90 tablet, Rfl: 0 .  traZODone (DESYREL) 100 MG tablet, Take 1 tablet by mouth at  bedtime, Disp: 90 tablet, Rfl: 1  No Known Allergies   ROS  Constitutional: Negative for fever , positive for weight change.  Respiratory: Negative for cough and shortness of breath.   Cardiovascular: Negative for chest pain or palpitations.  Gastrointestinal: Negative for abdominal pain, no bowel changes.  Musculoskeletal: Negative for gait problem or joint swelling.  Skin: Negative for rash.  Neurological: Negative for dizziness or headache.    No other specific complaints in a complete review of systems (except as listed in HPI above). Objective  Vitals:   02/18/16 0925  BP: 116/68  Pulse: 64  Resp: 16  Temp: 99.1 F (37.3 C)  TempSrc: Oral  SpO2: 97%  Weight: (!) 323 lb 3 oz (146.6 kg)  Height: 5\' 7"  (1.702 m)    Body mass index is 50.62 kg/m.  Physical Exam  Constitutional: Patient appears well-developed and well-nourished. Obese  No distress.  HEENT: head atraumatic, normocephalic, pupils equal and reactive to light,  neck supple, throat within normal limits Cardiovascular: Normal rate, regular rhythm and normal heart sounds.  No murmur heard. Trace BLE edema. Pulmonary/Chest: Effort normal and breath sounds normal. No respiratory distress. Abdominal: Soft.  There is no tenderness. Psychiatric:  Patient has a normal mood and affect. behavior is normal. Judgment and thought content normal.  Recent Results (from the past 2160 hour(s))  Hemoglobin A1c     Status: Abnormal   Collection Time: 12/08/15 12:45 PM  Result Value Ref Range   Hgb A1c MFr Bld 5.8 (H) 4.8 - 5.6 %    Comment:          Pre-diabetes: 5.7 - 6.4          Diabetes: >6.4          Glycemic control for adults with diabetes: <7.0    Est. average glucose Bld gHb Est-mCnc 120 mg/dL  Lipid panel     Status: None   Collection Time: 12/08/15 12:45  PM  Result Value Ref Range   Cholesterol, Total 161 100 - 199 mg/dL   Triglycerides 79 0 - 149 mg/dL   HDL 64 >39 mg/dL   VLDL Cholesterol Cal 16 5 - 40 mg/dL   LDL Calculated 81 0 - 99 mg/dL   Chol/HDL Ratio 2.5 0.0 - 4.4 ratio units    Comment:                                   T. Chol/HDL Ratio                                             Men  Women                               1/2 Avg.Risk  3.4    3.3                                   Avg.Risk  5.0    4.4                                2X Avg.Risk  9.6    7.1                                3X Avg.Risk 23.4   11.0   Comprehensive metabolic panel     Status: None   Collection Time: 12/08/15 12:45 PM  Result Value Ref Range   Glucose 88 65 - 99 mg/dL   BUN 12 6 - 24 mg/dL   Creatinine, Ser 0.72 0.57 - 1.00 mg/dL   GFR calc non Af Amer 93 >59 mL/min/1.73   GFR calc Af Amer 107 >59 mL/min/1.73   BUN/Creatinine Ratio 17 9 - 23   Sodium 142 134 - 144 mmol/L   Potassium 3.5 3.5 - 5.2 mmol/L   Chloride 102 96 - 106 mmol/L   CO2 25 18 - 29 mmol/L   Calcium 9.5 8.7 - 10.2 mg/dL   Total Protein 6.8 6.0 - 8.5 g/dL   Albumin 4.2 3.5 - 5.5 g/dL   Globulin, Total 2.6 1.5 - 4.5 g/dL   Albumin/Globulin Ratio 1.6 1.2 - 2.2   Bilirubin Total 0.4 0.0 - 1.2 mg/dL   Alkaline Phosphatase 109 39 - 117 IU/L   AST 12 0 - 40 IU/L   ALT 14 0 - 32 IU/L  VITAMIN D 25 Hydroxy (Vit-D Deficiency, Fractures)     Status: None   Collection  Time: 12/08/15 12:45 PM  Result Value Ref Range   Vit D, 25-Hydroxy 31.5 30.0 - 100.0 ng/mL    Comment: Vitamin D deficiency has been defined by the Institute of Medicine and an Endocrine Society practice guideline as a level of serum 25-OH vitamin D less than 20 ng/mL (1,2). The Endocrine Society went on to further define vitamin D insufficiency as a level between 21 and 29 ng/mL (2). 1. IOM (Institute of Medicine). 2010. Dietary reference    intakes for calcium and D. [place]: The    [employer].  2. Holick MF, Binkley [place], Bischoff-Ferrari HA, et al.    Evaluation, treatment, and prevention of vitamin D    deficiency: an Endocrine Society clinical practice    guideline. JCEM. 2011 Jul; 96(7):1911-30.   Vitamin B12     Status: Abnormal   Collection Time: 12/08/15 12:45 PM  Result Value Ref Range   Vitamin B-12 1,341 (H) 211 - 946 pg/mL     PHQ2/9: Depression screen [facility] 2/9 02/18/2016 12/08/2015 10/01/2015 06/30/2015 01/27/2015  Decreased Interest 0 0 0 0 0  Down, Depressed, Hopeless 0 0 0 0 0  PHQ - 2 Score 0 0 0 0 0     Fall Risk: Fall Risk  02/18/2016 12/08/2015 10/01/2015 06/30/2015 01/27/2015  Falls in the past year? No No No No No     Functional Status Survey: Is the patient deaf or have difficulty hearing?: No Does the patient have difficulty seeing, even when wearing glasses/contacts?: No Does the patient have difficulty concentrating, remembering, or making decisions?: No Does the patient have difficulty walking or climbing stairs?: Yes (sometimes knees) Does the patient have difficulty dressing or bathing?: No Does the patient have difficulty doing errands alone such as visiting a doctor's office or shopping?: No    Assessment & Plan  1. Benign essential HTN  - atenolol-chlorthalidone (TENORETIC) 50-25 MG tablet; Take 1 tablet by mouth  daily  Dispense: 90 tablet; Refill: 1 - amLODipine-benazepril (LOTREL) 5-20 MG capsule; Take 1 capsule by  mouth daily.  Dispense: 90 capsule; Refill: 1  2. Morbid obesity due to excess calories ([place])  Gaining weight, does not want to take any other medication for weight loss at this time, she has been depressed and we will try treating depression now  3. Dyslipidemia  - atorvastatin (LIPITOR) 40 MG tablet; Take 1 tablet by mouth  daily at 6pm  Dispense: 90 tablet; Refill: 1  4. Obstructive apnea  She has been wearing CPAP machine  5. Insomnia, persistent  Taking trazodone every night  6. Dysmetabolic syndrome  Discussed life style modification   7. Mild episode of recurrent major depressive disorder (HCC)  Discussed possible side effects of medication - venlafaxine XR (EFFEXOR-XR) 37.5 MG 24 hr capsule; Take 1-2 capsules (37.5-75 mg total) by mouth daily with breakfast.  Dispense: 60 capsule; Refill: 0   8. Plantar fasciitis, right  We will write a note for work, so she can wear tennis shoes daily   9. Trochanteric bursitis, right hip

## 2016-02-25 ENCOUNTER — Other Ambulatory Visit: Payer: Self-pay | Admitting: Family Medicine

## 2016-02-25 DIAGNOSIS — F330 Major depressive disorder, recurrent, mild: Secondary | ICD-10-CM

## 2016-02-25 NOTE — Telephone Encounter (Signed)
Patient requesting refill of Effexor.

## 2016-02-26 ENCOUNTER — Telehealth: Payer: Self-pay

## 2016-02-26 NOTE — Telephone Encounter (Signed)
Patient stated that her job is not accepting the letter that was submitted regarding that she wears tennis shoes to help alleviate her foot pain.  She stated that the [employer] is asking that we complete paperwork in order for them to give their approval of her wearing the shoes in office.  Patient was told to fax over the paperwork so it can be reviewed by Dr. Ancil Boozer so she can decided if she will complete the paperwork or not.

## 2016-02-29 NOTE — Telephone Encounter (Signed)
Patient stated that she is taking just 1 pill per day

## 2016-02-29 NOTE — Telephone Encounter (Signed)
IS she taking 2 pills daily? I will send 75 mg if she is

## 2016-03-15 ENCOUNTER — Telehealth: Payer: Self-pay

## 2016-03-15 NOTE — Telephone Encounter (Signed)
I contacted this patient to inform her that we could not complete the paperwork since it has never actually documented that she has the condition of plantar fascitis, but that she could come in for an appt and get referred to a podiatrist for her bilateral foot pain. I also gave her the option to have her chiropractor to complete the forms since he was the one who strongly recommended that she wear sneakers at work.  I asked which of the two did she want to do and she said neither. I then asked if she wanted her paperwork back and she said no.

## 2016-03-15 NOTE — Telephone Encounter (Signed)
I just reviewed her notes from her last visit and she has plantar fascitis. I will fill it out.

## 2016-03-16 ENCOUNTER — Ambulatory Visit: Payer: 59 | Admitting: Family Medicine

## 2016-04-19 ENCOUNTER — Other Ambulatory Visit: Payer: Self-pay

## 2016-04-19 DIAGNOSIS — F330 Major depressive disorder, recurrent, mild: Secondary | ICD-10-CM

## 2016-04-19 NOTE — Telephone Encounter (Signed)
Patient requesting refill of Effexor to [facility]. 

## 2016-04-20 MED ORDER — VENLAFAXINE HCL ER 37.5 MG PO CP24: 37.5000 mg | ORAL_CAPSULE | Freq: Every day | ORAL | 0 refills | Status: DC

## 2016-08-09 ENCOUNTER — Other Ambulatory Visit: Payer: Self-pay | Admitting: Family Medicine

## 2016-08-09 DIAGNOSIS — E785 Hyperlipidemia, unspecified: Secondary | ICD-10-CM

## 2016-08-09 DIAGNOSIS — I10 Essential (primary) hypertension: Secondary | ICD-10-CM

## 2016-08-09 DIAGNOSIS — G4700 Insomnia, unspecified: Secondary | ICD-10-CM

## 2016-08-09 DIAGNOSIS — F330 Major depressive disorder, recurrent, mild: Secondary | ICD-10-CM

## 2016-08-10 NOTE — Telephone Encounter (Signed)
Patient requesting refill of Lotrel, Tenoretic, Atorvastatin, Desyrel, and Effexor to [facility].

## 2016-08-18 ENCOUNTER — Ambulatory Visit (INDEPENDENT_AMBULATORY_CARE_PROVIDER_SITE_OTHER): Payer: 59 | Admitting: Family Medicine

## 2016-08-18 ENCOUNTER — Encounter: Payer: Self-pay | Admitting: Family Medicine

## 2016-08-18 VITALS — BP 118/68 | HR 69 | Temp 98.7°F | Resp 16 | Ht 67.0 in | Wt 335.5 lb

## 2016-08-18 DIAGNOSIS — I10 Essential (primary) hypertension: Secondary | ICD-10-CM

## 2016-08-18 DIAGNOSIS — F330 Major depressive disorder, recurrent, mild: Secondary | ICD-10-CM

## 2016-08-18 DIAGNOSIS — M25562 Pain in left knee: Secondary | ICD-10-CM

## 2016-08-18 DIAGNOSIS — E785 Hyperlipidemia, unspecified: Secondary | ICD-10-CM | POA: Diagnosis not present

## 2016-08-18 DIAGNOSIS — G4733 Obstructive sleep apnea (adult) (pediatric): Secondary | ICD-10-CM | POA: Diagnosis not present

## 2016-08-18 DIAGNOSIS — M79671 Pain in right foot: Secondary | ICD-10-CM

## 2016-08-18 DIAGNOSIS — M25561 Pain in right knee: Secondary | ICD-10-CM

## 2016-08-18 DIAGNOSIS — G4700 Insomnia, unspecified: Secondary | ICD-10-CM

## 2016-08-18 DIAGNOSIS — E8881 Metabolic syndrome: Secondary | ICD-10-CM

## 2016-08-18 MED ORDER — TRAZODONE HCL 100 MG PO TABS: 100.0000 mg | ORAL_TABLET | Freq: Every day | ORAL | 1 refills | Status: DC

## 2016-08-18 MED ORDER — ATENOLOL-CHLORTHALIDONE 50-25 MG PO TABS: ORAL_TABLET | ORAL | 1 refills | Status: DC

## 2016-08-18 MED ORDER — PHENTERMINE-TOPIRAMATE ER 7.5-46 MG PO CP24: 1.0000 | ORAL_CAPSULE | Freq: Every day | ORAL | 2 refills | Status: DC

## 2016-08-18 MED ORDER — AMLODIPINE BESY-BENAZEPRIL HCL 5-20 MG PO CAPS: 1.0000 | ORAL_CAPSULE | Freq: Every day | ORAL | 1 refills | Status: DC

## 2016-08-18 MED ORDER — VENLAFAXINE HCL ER 75 MG PO CP24: 75.0000 mg | ORAL_CAPSULE | Freq: Every day | ORAL | 0 refills | Status: DC

## 2016-08-18 MED ORDER — ATORVASTATIN CALCIUM 40 MG PO TABS: ORAL_TABLET | ORAL | 1 refills | Status: DC

## 2016-08-18 NOTE — Progress Notes (Signed)
Name: Emma Oconnor   MRN: [id number]    DOB: 10-May-1955   Date:08/18/2016       Progress Note  Subjective  Chief Complaint  Chief Complaint  Patient presents with  . Hypertension    follow up no issues  . Hyperlipidemia  . Insomnia    still not sleeping well  . Obesity    gaining weight    HPI  HTN: taking bp daily and denies side effects. No chest pain or palpitation. BP is slightly low, she denies dizziness. Discussed cutting Atenolol/chlorthalidone half pill if needed.   Pre-diabetes: hgbA1C was 5.8%, she denies polydipsia or polyuria, she has episodes of polyphagia. Discussed GLP-1 agonist to see if it would help her lose weight but she wants to hold off for now.   Hyperlipidemia: taking Atorvastatin as prescribed. Denies side effects of medications, no myalgia, reviewed lab work with her  Insomnia: Trazodone helps her fall asleep but not stay asleep, she states she wakes up because of pain sometimes - she has to use a pillow between her knees.   Obesity: she was 352.6 lbs in March 2015, she was started on Qsymia and lost over 5 % of original weight, she had a gap because her husband lost his job Dec 2015. Resumed medication at weight of 342 lbs in July 2016 . She states she stopped Qsymia because it stopped working around 11/2015 at 323.3 lbs, she has noticed that she snacking again , no making food choices, unable to exercise because of right foot pain. She states that 2 weeks ago she resumed taking Qsymia that she had at home and she has been drinking more water, eating less and would like refills. Today's weight is 335.8lbs  OSA: wears CPAP every night now. She wakes up feeling rested when she is able to sleep all night. She has gained weight   Right plantar fascitis: she has been seeing chiropractor and is getting better, but when she gets up in am pain is severe, symptoms improves when wearing tennis shoes but worse with work shoes.   Chronic trochanteric pain:   symptoms started Spring 2017. She went to chiropractor and pain improved, but she stopped going because she developed other pains, knee pain, foot pain. She is not sleeping well because of it.   Depression Major: she used to take medication many years ago. She states since Summer 07 her husband has been living out of town, for his job, initially it was nice, but she started to feel lonely she was having occasional crying spells and was feeling sad, we gave her a rx of Effexor 11/2015. She took medication for a few weeks and it helped her symptoms, but he came back home and she stopped medication, however she is still under stress, still has episodes of sadness, she also has night sweats and hot flashes. Discussed going back on medication and she will try it.   Right foot pain: she states that about one year ago she developed pain on right heel, pain is not resolving, she has been wearing tennis shoes at work, but pain is getting worse and is all over right heel. Worse when she first gets up or when standing after sitting for a prolonged period of time  Patient Active Problem List   Diagnosis Date Noted  . Benign neoplasm of ascending colon   . Benign neoplasm of sigmoid colon   . Benign essential HTN 10/25/2014  . Bursitis of shoulder 10/25/2014  . Insomnia, persistent  10/25/2014  . Dyslipidemia 10/25/2014  . Female stress incontinence 10/25/2014  . Eczema intertrigo 10/25/2014  . Menopause 10/25/2014  . Dysmetabolic syndrome 21/30/8657  . Extreme obesity 10/25/2014  . Obstructive apnea 10/25/2014  . Vitamin D deficiency 10/25/2014  . Skin tag 10/25/2014  . Knee pain 10/25/2014    Past Surgical History:  Procedure Laterality Date  . COLONOSCOPY WITH PROPOFOL N/A 02/17/2015   Procedure: COLONOSCOPY WITH PROPOFOL;  Surgeon: Lucilla Lame, MD;  Location: [facility] ENDOSCOPY;  Service: Endoscopy;  Laterality: N/A;  . TUBAL LIGATION      Family History  Problem Relation Age of Onset  . Diabetes  Mother   . Hypertension Mother   . Hyperlipidemia Mother   . Multiple sclerosis Brother   . Diabetes Brother     Social History   Social History  . Marital status: Married    Spouse name: N/A  . Number of children: N/A  . Years of education: N/A   Occupational History  . Not on file.   Social History Main Topics  . Smoking status: Former Smoker    Packs/day: 0.50    Years: 30.00    Types: Cigarettes    Start date: 04/19/1975    Quit date: 12/17/2005  . Smokeless tobacco: Never Used  . Alcohol use No  . Drug use: No  . Sexual activity: Not Currently   Other Topics Concern  . Not on file   Social History Narrative  . No narrative on file     Current Outpatient Prescriptions:  .  acetaminophen (TYLENOL) 500 MG tablet, Take 1 tablet (500 mg total) by mouth every 8 (eight) hours as needed., Disp: 90 tablet, Rfl: 0 .  amLODipine-benazepril (LOTREL) 5-20 MG capsule, Take 1 capsule by mouth daily., Disp: 90 capsule, Rfl: 1 .  aspirin EC 81 MG tablet, Take 1 tablet (81 mg total) by mouth daily., Disp: 30 tablet, Rfl: 0 .  atenolol-chlorthalidone (TENORETIC) 50-25 MG tablet, Take 1 tablet by mouth  daily, Disp: 90 tablet, Rfl: 1 .  atorvastatin (LIPITOR) 40 MG tablet, Take 1 tablet by mouth  daily at 6pm, Disp: 90 tablet, Rfl: 1 .  naproxen (NAPROSYN) 500 MG tablet, Take 1 tablet (500 mg total) by mouth 2 (two) times daily as needed., Disp: 60 tablet, Rfl: 0 .  traMADol (ULTRAM) 50 MG tablet, Take 1 tablet (50 mg total) by mouth every 8 (eight) hours as needed., Disp: 90 tablet, Rfl: 0 .  traZODone (DESYREL) 100 MG tablet, Take 1 tablet (100 mg total) by mouth at bedtime., Disp: 90 tablet, Rfl: 1 .  venlafaxine XR (EFFEXOR-XR) 75 MG 24 hr capsule, Take 1 capsule (75 mg total) by mouth daily with breakfast., Disp: 90 capsule, Rfl: 0  No Known Allergies   ROS  Constitutional: Negative for fever, positive  weight change.  Respiratory: Negative for cough and shortness of breath.    Cardiovascular: Negative for chest pain or palpitations.  Gastrointestinal: Negative for abdominal pain, no bowel changes.  Musculoskeletal: Positive for gait problem or joint swelling.  Skin: Negative for rash.  Neurological: Negative for dizziness or headache.  No other specific complaints in a complete review of systems (except as listed in HPI above).  Objective  Vitals:   08/18/16 0759  BP: 118/68  Pulse: 69  Resp: 16  Temp: 98.7 F (37.1 C)  SpO2: 94%  Weight: (!) 335 lb 8 oz (152.2 kg)  Height: 5\' 7"  (1.702 m)    Body mass index is 52.55  kg/m.  Physical Exam  Constitutional: Patient appears well-developed and well-nourished. Obese  No distress.  HEENT: head atraumatic, normocephalic, pupils equal and reactive to light,  neck supple, throat within normal limits Cardiovascular: Normal rate, regular rhythm and normal heart sounds.  No murmur heard. No BLE edema. Pulmonary/Chest: Effort normal and breath sounds normal. No respiratory distress. Abdominal: Soft.  There is no tenderness. Psychiatric: Patient has a normal mood and affect. behavior is normal. Judgment and thought content normal. Muscular Skeletal: antalgic gait, pain during palpation of right heel , crepitus with extension of right knee  PHQ2/9: Depression screen [facility] 2/9 02/18/2016 12/08/2015 10/01/2015 06/30/2015 01/27/2015  Decreased Interest 0 0 0 0 0  Down, Depressed, Hopeless 0 0 0 0 0  PHQ - 2 Score 0 0 0 0 0     Fall Risk: Fall Risk  02/18/2016 12/08/2015 10/01/2015 06/30/2015 01/27/2015  Falls in the past year? No No No No No     Assessment & Plan  1. Benign essential HTN  - atenolol-chlorthalidone (TENORETIC) 50-25 MG tablet; Take 1 tablet by mouth  daily  Dispense: 90 tablet; Refill: 1 - amLODipine-benazepril (LOTREL) 5-20 MG capsule; Take 1 capsule by mouth daily.  Dispense: 90 capsule; Refill: 1  2. Morbid obesity due to excess calories ([place])  She would like to resume Qsymia at 7.5/46  dose  3. Dyslipidemia  - atorvastatin (LIPITOR) 40 MG tablet; Take 1 tablet by mouth  daily at 6pm  Dispense: 90 tablet; Refill: 1  4. Obstructive apnea   5. Insomnia, persistent  - traZODone (DESYREL) 100 MG tablet; Take 1 tablet (100 mg total) by mouth at bedtime.  Dispense: 90 tablet; Refill: 1  6. Dysmetabolic syndrome  Discussed life style modification   7. Arthralgia of both knees  stable  8. Right foot pain  - Ambulatory referral to Podiatry  9. Mild episode of recurrent major depressive disorder (HCC)  - venlafaxine XR (EFFEXOR-XR) 75 MG 24 hr capsule; Take 1 capsule (75 mg total) by mouth daily with breakfast.  Dispense: 90 capsule; Refill: 0

## 2016-08-18 NOTE — Patient Instructions (Signed)
Hip Exercises Ask your health care provider which exercises are safe for you. Do exercises exactly as told by your health care provider and adjust them as directed. It is normal to feel mild stretching, pulling, tightness, or discomfort as you do these exercises, but you should stop right away if you feel sudden pain or your pain gets worse.Do not begin these exercises until told by your health care provider. STRETCHING AND RANGE OF MOTION EXERCISES  These exercises warm up your muscles and joints and improve the movement and flexibility of your hip. These exercises also help to relieve pain, numbness, and tingling. Exercise A: Hamstrings, Supine   1. Lie on your back. 2. Loop a belt or towel over the ball of your left / rightfoot. The ball of your foot is on the walking surface, right under your toes. 3. Straighten your left / rightknee and slowly pull on the belt to raise your leg.  Do not let your left / right knee bend while you do this.  Keep your other leg flat on the floor.  Raise the left / right leg until you feel a gentle stretch behind your left / right knee or thigh. 4. Hold this position for __________ seconds. 5. Slowly return your leg to the starting position. Repeat __________ times. Complete this stretch __________ times a day. Exercise B: Hip Rotators   1. Lie on your back on a firm surface. 2. Hold your left / right knee with your left / right hand. Hold your ankle with your other hand. 3. Gently pull your left / right knee and rotate your lower leg toward your other shoulder.  Pull until you feel a stretch in your buttocks.  Keep your hips and shoulders firmly planted while you do this stretch. 4. Hold this position for __________ seconds. Repeat __________ times. Complete this stretch __________ times a day. Exercise C: V-Sit (Hamstrings and Adductors)   1. Sit on the floor with your legs extended in a large "V" shape. Keep your knees straight during this  exercise. 2. Start with your head and chest upright, then bend at your waist to reach for your left foot (position A). You should feel a stretch in your right inner thigh. 3. Hold this position for __________ seconds. Then slowly return to the upright position. 4. Bend at your waist to reach forward (position B). You should feel a stretch behind both of your thighs and knees. 5. Hold this position for __________ seconds. Then slowly return to the upright position. 6. Bend at your waist to reach for your right foot (position C). You should feel a stretch in your left inner thigh. 7. Hold this position for __________ seconds. Then slowly return to the upright position. Repeat __________ times. Complete this stretch __________ times a day. Exercise D: Lunge (Hip Flexors)   1. Place your left / right knee on the floor and bend your other knee so that is directly over your ankle. You should be half-kneeling. 2. Keep good posture with your head over your shoulders. 3. Tighten your buttocks to point your tailbone downward. This helps your back to keep from arching too much. 4. You should feel a gentle stretch in the front of your left / right thigh and hip. If you do not feel any resistance, slightly slide your other foot forward and then slowly lunge forward so your knee once again lines up over your ankle. 5. Make sure your tailbone continues to point downward. 6. Hold this   position for __________ seconds. Repeat __________ times. Complete this stretch __________ times a day. STRENGTHENING EXERCISES  These exercises build strength and endurance in your hip. Endurance is the ability to use your muscles for a long time, even after they get tired. Exercise E: Bridge (Hip Extensors)   1. Lie on your back on a firm surface with your knees bent and your feet flat on the floor. 2. Tighten your buttocks muscles and lift your bottom off the floor until the trunk of your body is level with your thighs.  Do  not arch your back.  You should feel the muscles working in your buttocks and the back of your thighs. If you do not feel these muscles, slide your feet 1-2 inches (2.5-5 cm) farther away from your buttocks. 3. Hold this position for __________ seconds. 4. Slowly lower your hips to the starting position. 5. Let your muscles relax completely between repetitions. 6. If this exercise is too easy, try doing it with your arms crossed over your chest. Repeat __________ times. Complete this exercise __________ times a day. Exercise F: Straight Leg Raises - Hip Abductors   1. Lie on your side with your left / right leg in the top position. Lie so your head, shoulder, knee, and hip line up with each other. You may bend your bottom knee to help you balance. 2. Roll your hips slightly forward, so your hips are stacked directly over each other and your left / right knee is facing forward. 3. Leading with your heel, lift your top leg 4-6 inches (10-15 cm). You should feel the muscles in your outer hip lifting.  Do not let your foot drift forward.  Do not let your knee roll toward the ceiling. 4. Hold this position for __________ seconds. 5. Slowly return to the starting position. 6. Let your muscles relax completely between repetitions. Repeat __________ times. Complete this exercise __________ times a day. Exercise G: Straight Leg Raises - Hip Adductors   1. Lie on your side with your left / right leg in the bottom position. Lie so your head, shoulder, knee, and hip line up. You may place your upper foot in front to help you balance. 2. Roll your hips slightly forward, so your hips are stacked directly over each other and your left / right knee is facing forward. 3. Tense the muscles in your inner thigh and lift your bottom leg 4-6 inches (10-15 cm). 4. Hold this position for __________ seconds. 5. Slowly return to the starting position. 6. Let your muscles relax completely between  repetitions. Repeat __________ times. Complete this exercise __________ times a day. Exercise H: Straight Leg Raises - Quadriceps   1. Lie on your back with your left / right leg extended and your other knee bent. 2. Tense the muscles in the front of your left / right thigh. When you do this, you should see your kneecap slide up or see increased dimpling just above your knee. 3. Tighten these muscles even more and raise your leg 4-6 inches (10-15 cm) off the floor. 4. Hold this position for __________ seconds. 5. Keep these muscles tense as you lower your leg. 6. Relax the muscles slowly and completely between repetitions. Repeat __________ times. Complete this exercise __________ times a day. Exercise I: Hip Abductors, Standing  1. Tie one end of a rubber exercise band or tubing to a secure surface, such as a table or pole. 2. Loop the other end of the band or tubing   around your left / right ankle. 3. Keeping your ankle with the band or tubing directly opposite of the secured end, step away until there is tension in the tubing or band. Hold onto a chair as needed for balance. 4. Lift your left / right leg out to your side. While you do this:  Keep your back upright.  Keep your shoulders over your hips.  Keep your toes pointing forward.  Make sure to use your hip muscles to lift your leg. Do not "throw" your leg or tip your body to lift your leg. 5. Hold this position for __________ seconds. 6. Slowly return to the starting position. Repeat __________ times. Complete this exercise __________ times a day. Exercise J: Squats (Quadriceps)  1. Stand in a door frame so your feet and knees are in line with the frame. You may place your hands on the frame for balance. 2. Slowly bend your knees and lower your hips like you are going to sit in a chair.  Keep your lower legs in a straight-up-and-down position.  Do not let your hips go lower than your knees.  Do not bend your knees lower than  told by your health care provider.  If your hip pain increases, do not bend as low. 3. Hold this position for ___________ seconds. 4. Slowly push with your legs to return to standing. Do not use your hands to pull yourself to standing. Repeat __________ times. Complete this exercise __________ times a day. This information is not intended to replace advice given to you by your health care provider. Make sure you discuss any questions you have with your health care provider. Document Released: 04/22/2005 Document Revised: 12/28/2015 Document Reviewed: 03/30/2015 Elsevier Interactive Patient Education  2017 [employer]  

## 2016-09-19 ENCOUNTER — Ambulatory Visit: Payer: 59

## 2016-09-19 ENCOUNTER — Encounter: Payer: Self-pay | Admitting: Podiatry

## 2016-09-19 DIAGNOSIS — M79671 Pain in right foot: Secondary | ICD-10-CM

## 2016-11-08 NOTE — Progress Notes (Signed)
This encounter was created in error - please disregard.

## 2016-11-29 ENCOUNTER — Ambulatory Visit (INDEPENDENT_AMBULATORY_CARE_PROVIDER_SITE_OTHER): Payer: 59 | Admitting: Family Medicine

## 2016-11-29 ENCOUNTER — Encounter: Payer: Self-pay | Admitting: Family Medicine

## 2016-11-29 VITALS — BP 122/80 | HR 76 | Temp 98.3°F | Resp 16 | Ht 67.0 in | Wt 336.0 lb

## 2016-11-29 DIAGNOSIS — M25561 Pain in right knee: Secondary | ICD-10-CM | POA: Diagnosis not present

## 2016-11-29 DIAGNOSIS — M25562 Pain in left knee: Secondary | ICD-10-CM | POA: Diagnosis not present

## 2016-11-29 DIAGNOSIS — E559 Vitamin D deficiency, unspecified: Secondary | ICD-10-CM | POA: Diagnosis not present

## 2016-11-29 DIAGNOSIS — G4700 Insomnia, unspecified: Secondary | ICD-10-CM | POA: Diagnosis not present

## 2016-11-29 DIAGNOSIS — E785 Hyperlipidemia, unspecified: Secondary | ICD-10-CM

## 2016-11-29 DIAGNOSIS — Z0000 Encounter for general adult medical examination without abnormal findings: Secondary | ICD-10-CM | POA: Diagnosis not present

## 2016-11-29 DIAGNOSIS — G4733 Obstructive sleep apnea (adult) (pediatric): Secondary | ICD-10-CM | POA: Diagnosis not present

## 2016-11-29 DIAGNOSIS — I10 Essential (primary) hypertension: Secondary | ICD-10-CM | POA: Diagnosis not present

## 2016-11-29 DIAGNOSIS — Z124 Encounter for screening for malignant neoplasm of cervix: Secondary | ICD-10-CM

## 2016-11-29 DIAGNOSIS — Z1231 Encounter for screening mammogram for malignant neoplasm of breast: Secondary | ICD-10-CM

## 2016-11-29 DIAGNOSIS — E8881 Metabolic syndrome: Secondary | ICD-10-CM | POA: Diagnosis not present

## 2016-11-29 DIAGNOSIS — Z1239 Encounter for other screening for malignant neoplasm of breast: Secondary | ICD-10-CM

## 2016-11-29 DIAGNOSIS — F330 Major depressive disorder, recurrent, mild: Secondary | ICD-10-CM

## 2016-11-29 DIAGNOSIS — Z01419 Encounter for gynecological examination (general) (routine) without abnormal findings: Secondary | ICD-10-CM

## 2016-11-29 MED ORDER — VENLAFAXINE HCL ER 75 MG PO CP24: 75.0000 mg | ORAL_CAPSULE | Freq: Every day | ORAL | 0 refills | Status: DC

## 2016-11-29 MED ORDER — PHENTERMINE-TOPIRAMATE ER 7.5-46 MG PO CP24: 1.0000 | ORAL_CAPSULE | Freq: Every day | ORAL | 2 refills | Status: DC

## 2016-11-29 MED ORDER — TRAMADOL HCL 50 MG PO TABS: 50.0000 mg | ORAL_TABLET | Freq: Three times a day (TID) | ORAL | 0 refills | Status: DC | PRN

## 2016-11-29 NOTE — Patient Instructions (Signed)
Preventive Care 40-64 Years, Female Preventive care refers to lifestyle choices and visits with your health care provider that can promote health and wellness. What does preventive care include?  A yearly physical exam. This is also called an annual well check.  Dental exams once or twice a year.  Routine eye exams. Ask your health care provider how often you should have your eyes checked.  Personal lifestyle choices, including: ? Daily care of your teeth and gums. ? Regular physical activity. ? Eating a healthy diet. ? Avoiding tobacco and drug use. ? Limiting alcohol use. ? Practicing safe sex. ? Taking low-dose aspirin daily starting at age 58. ? Taking vitamin and mineral supplements as recommended by your health care provider. What happens during an annual well check? The services and screenings done by your health care provider during your annual well check will depend on your age, overall health, lifestyle risk factors, and family history of disease. Counseling Your health care provider may ask you questions about your:  Alcohol use.  Tobacco use.  Drug use.  Emotional well-being.  Home and relationship well-being.  Sexual activity.  Eating habits.  Work and work Statistician.  Method of birth control.  Menstrual cycle.  Pregnancy history.  Screening You may have the following tests or measurements:  Height, weight, and BMI.  Blood pressure.  Lipid and cholesterol levels. These may be checked every 5 years, or more frequently if you are over 81 years old.  Skin check.  Lung cancer screening. You may have this screening every year starting at age 78 if you have a 30-pack-year history of smoking and currently smoke or have quit within the past 15 years.  Fecal occult blood test (FOBT) of the stool. You may have this test every year starting at age 65.  Flexible sigmoidoscopy or colonoscopy. You may have a sigmoidoscopy every 5 years or a colonoscopy  every 10 years starting at age 30.  Hepatitis C blood test.  Hepatitis B blood test.  Sexually transmitted disease (STD) testing.  Diabetes screening. This is done by checking your blood sugar (glucose) after you have not eaten for a while (fasting). You may have this done every 1-3 years.  Mammogram. This may be done every 1-2 years. Talk to your health care provider about when you should start having regular mammograms. This may depend on whether you have a family history of breast cancer.  BRCA-related cancer screening. This may be done if you have a family history of breast, ovarian, tubal, or peritoneal cancers.  Pelvic exam and Pap test. This may be done every 3 years starting at age 80. Starting at age 36, this may be done every 5 years if you have a Pap test in combination with an HPV test.  Bone density scan. This is done to screen for osteoporosis. You may have this scan if you are at high risk for osteoporosis.  Discuss your test results, treatment options, and if necessary, the need for more tests with your health care provider. Vaccines Your health care provider may recommend certain vaccines, such as:  Influenza vaccine. This is recommended every year.  Tetanus, diphtheria, and acellular pertussis (Tdap, Td) vaccine. You may need a Td booster every 10 years.  Varicella vaccine. You may need this if you have not been vaccinated.  Zoster vaccine. You may need this after age 5.  Measles, mumps, and rubella (MMR) vaccine. You may need at least one dose of MMR if you were born in  1955 or later. You may also need a second dose.  Pneumococcal 13-valent conjugate (PCV13) vaccine. You may need this if you have certain conditions and were not previously vaccinated.  Pneumococcal polysaccharide (PPSV23) vaccine. You may need one or two doses if you smoke cigarettes or if you have certain conditions.  Meningococcal vaccine. You may need this if you have certain  conditions.  Hepatitis A vaccine. You may need this if you have certain conditions or if you travel or work in places where you may be exposed to hepatitis A.  Hepatitis B vaccine. You may need this if you have certain conditions or if you travel or work in places where you may be exposed to hepatitis B.  Haemophilus influenzae type b (Hib) vaccine. You may need this if you have certain conditions.  Talk to your health care provider about which screenings and vaccines you need and how often you need them. This information is not intended to replace advice given to you by your health care provider. Make sure you discuss any questions you have with your health care provider. Document Released: 05/01/2015 Document Revised: 12/23/2015 Document Reviewed: 02/03/2015 Elsevier Interactive Patient Education  2017 [employer].

## 2016-11-29 NOTE — Progress Notes (Signed)
Name: Emma Oconnor   MRN: [id number]    DOB: 04-07-1956   Date:11/29/2016       Progress Note  Subjective  Chief Complaint  Chief Complaint  Patient presents with  . Annual Exam    HPI  Well woman: she is married, no previous abnormal pap smear, due for mammogram October 2018. She is up to date with colonoscopy, taking aspirin daily.   HTN: taking bp daily and denies side effects. No chest pain or palpitation. BP is at goal,  she denies dizziness.   Pre-diabetes: hgbA1C was 5.8%, she denies polydipsia or polyuria, she has episodes of polyphagia. Discussed GLP-1 agonist to see if it would help her lose weight but she wants to hold off for now. She is trying to eat healthier and exercising more.  Hyperlipidemia: taking Atorvastatin as prescribed. Denies side effects of medications, no myalgia, due for labs.   Insomnia: Trazodone helps her fall asleep but not stay asleep, she states she wakes up because of pain on her hips.   Obesity: she was 352.6 lbs in March 2015, she was started on Qsymia and lost over 5 % of original weight, she had a gap because her husband lost his job Dec 2015. Resumed medication at weight of 342 lbs in July 2016 . She states she stopped Qsymia because it stopped working around 11/2015 at 323.3 lbs, she has noticed that she snacking again , no making food choices, unable to exercise because of right foot pain. She was seen in May and we gave her a rx of Qsymia but it was never filled, she is up to 336lbs today, she states her foot is doing better, she is trying to eat healthier, she is going to yoga classes and will sign up for water aerobics, she would like to resume Qsymia, and has joined [employer].   OSA: wears CPAP every night now. She wakes up feeling rested when she is able to sleep all night. She has gained weight   Depression Major: she used to take medication many years ago. She states since Summer 2017 her husband has been living out of town,  for his job, initially it was nice, but she started to feel lonely she was having occasional crying spells and was feeling sad, we gave her a rx of Effexor 11/2015. She states she is doing better since she has been on medication.  Patient Active Problem List   Diagnosis Date Noted  . Benign neoplasm of ascending colon   . Benign neoplasm of sigmoid colon   . Benign essential HTN 10/25/2014  . Bursitis of shoulder 10/25/2014  . Insomnia, persistent 10/25/2014  . Dyslipidemia 10/25/2014  . Female stress incontinence 10/25/2014  . Eczema intertrigo 10/25/2014  . Menopause 10/25/2014  . Dysmetabolic syndrome 02/01/5101  . Extreme obesity 10/25/2014  . Obstructive apnea 10/25/2014  . Vitamin D deficiency 10/25/2014  . Skin tag 10/25/2014  . Knee pain 10/25/2014    Past Surgical History:  Procedure Laterality Date  . COLONOSCOPY WITH PROPOFOL N/A 02/17/2015   Procedure: COLONOSCOPY WITH PROPOFOL;  Surgeon: Lucilla Lame, MD;  Location: [facility] ENDOSCOPY;  Service: Endoscopy;  Laterality: N/A;  . TUBAL LIGATION      Family History  Problem Relation Age of Onset  . Diabetes Mother   . Hypertension Mother   . Hyperlipidemia Mother   . Multiple sclerosis Brother   . Diabetes Brother     Social History   Social History  . Marital status: Married  Spouse name: N/A  . Number of children: N/A  . Years of education: N/A   Occupational History  . Not on file.   Social History Main Topics  . Smoking status: Former Smoker    Packs/day: 0.50    Years: 30.00    Types: Cigarettes    Start date: 04/19/1975    Quit date: 12/17/2005  . Smokeless tobacco: Never Used  . Alcohol use No  . Drug use: No  . Sexual activity: Not Currently   Other Topics Concern  . Not on file   Social History Narrative   Married.   Husband has been working out of town.   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  acetaminophen  (TYLENOL) 500 MG tablet, Take 1 tablet (500 mg total) by mouth every 8 (eight) hours as needed., Disp: 90 tablet, Rfl: 0 .  amLODipine-benazepril (LOTREL) 5-20 MG capsule, Take 1 capsule by mouth daily., Disp: 90 capsule, Rfl: 1 .  aspirin EC 81 MG tablet, Take 1 tablet (81 mg total) by mouth daily., Disp: 30 tablet, Rfl: 0 .  atenolol-chlorthalidone (TENORETIC) 50-25 MG tablet, Take 1 tablet by mouth  daily, Disp: 90 tablet, Rfl: 1 .  atorvastatin (LIPITOR) 40 MG tablet, Take 1 tablet by mouth  daily at 6pm, Disp: 90 tablet, Rfl: 1 .  Phentermine-Topiramate (QSYMIA) 7.5-46 MG CP24, Take 1 capsule by mouth daily., Disp: 30 capsule, Rfl: 2 .  traZODone (DESYREL) 100 MG tablet, Take 1 tablet (100 mg total) by mouth at bedtime., Disp: 90 tablet, Rfl: 1 .  venlafaxine XR (EFFEXOR-XR) 75 MG 24 hr capsule, Take 1 capsule (75 mg total) by mouth daily with breakfast., Disp: 90 capsule, Rfl: 0 .  traMADol (ULTRAM) 50 MG tablet, Take 1 tablet (50 mg total) by mouth every 8 (eight) hours as needed., Disp: 90 tablet, Rfl: 0  No Known Allergies   ROS  Constitutional: Negative for fever, positive  weight change.  Respiratory: Negative for cough and shortness of breath.   Cardiovascular: Negative for chest pain or palpitations.  Gastrointestinal: Negative for abdominal pain, no bowel changes.  Musculoskeletal: Positive for intermittent gait problem but no  joint swelling.  Skin: Negative for rash.  Neurological: Negative for dizziness or headache.  No other specific complaints in a complete review of systems (except as listed in HPI above).  Objective  Vitals:   11/29/16 0830  BP: 122/80  Pulse: 76  Resp: 16  Temp: 98.3 F (36.8 C)  TempSrc: Oral  SpO2: 94%  Weight: (!) 336 lb (152.4 kg)  Height: 5\' 7"  (1.702 m)    Body mass index is 52.63 kg/m.  Physical Exam  Constitutional: Patient appears well-developed and obese. No distress.  HENT: Head: Normocephalic and atraumatic. Ears: B TMs  ok, no erythema or effusion; Nose: Nose normal. Mouth/Throat: Oropharynx is clear and moist. No oropharyngeal exudate.  Eyes: Conjunctivae and EOM are normal. Pupils are equal, round, and reactive to light. No scleral icterus.  Neck: Normal range of motion. Neck supple. No JVD present. No thyromegaly present.  Cardiovascular: Normal rate, regular rhythm and normal heart sounds.  No murmur heard. No BLE edema. Pulmonary/Chest: Effort normal and breath sounds normal. No respiratory distress. Abdominal: Soft. Bowel sounds are normal, no distension. There is no tenderness. no masses Breast: no lumps or masses, no nipple discharge or rashes FEMALE GENITALIA:  External genitalia normal External urethra normal Vaginal vault normal  without discharge or lesions Cervix normal without discharge or lesions Bimanual exam normal without masses RECTAL: not done Musculoskeletal: Normal range of motion, no joint effusions. No gross deformities, crepitus with extension of both knees.  Neurological: he is alert and oriented to person, place, and time. No cranial nerve deficit. Coordination, balance, strength, speech and gait are normal.  Skin: Skin is warm and dry. No rash noted. No erythema.  Psychiatric: Patient has a normal mood and affect. behavior is normal. Judgment and thought content normal.    PHQ2/9: Depression screen [facility] 2/9 11/29/2016 02/18/2016 12/08/2015 10/01/2015 06/30/2015  Decreased Interest 0 0 0 0 0  Down, Depressed, Hopeless 0 0 0 0 0  PHQ - 2 Score 0 0 0 0 0     Fall Risk: Fall Risk  11/29/2016 02/18/2016 12/08/2015 10/01/2015 06/30/2015  Falls in the past year? No No No No No     Functional Status Survey: Is the patient deaf or have difficulty hearing?: No Does the patient have difficulty seeing, even when wearing glasses/contacts?: No Does the patient have difficulty concentrating, remembering, or making decisions?: No Does the patient have difficulty walking or climbing stairs?:  No Does the patient have difficulty dressing or bathing?: No Does the patient have difficulty doing errands alone such as visiting a doctor's office or shopping?: No   Assessment & Plan  1. Well woman exam  Discussed importance of 150 minutes of physical activity weekly, eat two servings of fish weekly, eat one serving of tree nuts ( cashews, pistachios, pecans, almonds.Marland Kitchen) every other day, eat 6 servings of fruit/vegetables daily and drink plenty of water and avoid sweet beverages.  - TSH - Vitamin B12  2. Cervical cancer screening  - PapIG, HPV, rfx 16/18  3. Benign essential HTN  - CBC With Differential - Comp. Metabolic Panel (12)  4. Morbid obesity due to excess calories ([place])  She never filled last rx, not sure where she placed it, she gained 26 lbs since last well visit, but is changing her life style and wants to resume medications - Phentermine-Topiramate (QSYMIA) 7.5-46 MG CP24; Take 1 capsule by mouth daily.  Dispense: 30 capsule; Refill: 2  5. Obstructive apnea  She wears it every night  6. Dyslipidemia  - Lipid panel  7. Insomnia, persistent  Doing well  8. Dysmetabolic syndrome  - Hemoglobin A1c - Insulin, 2 Hour  9. Vitamin D deficiency  - VITAMIN D 25 Hydroxy (Vit-D Deficiency, Fractures)  10. Breast cancer screening  - MM Digital Screening; Future  11. Mild episode of recurrent major depressive disorder (HCC)  - venlafaxine XR (EFFEXOR-XR) 75 MG 24 hr capsule; Take 1 capsule (75 mg total) by mouth daily with breakfast.  Dispense: 90 capsule; Refill: 0  12. Arthralgia of both knees  - traMADol (ULTRAM) 50 MG tablet; Take 1 tablet (50 mg total) by mouth every 8 (eight) hours as needed.  Dispense: 90 tablet; Refill: 0

## 2016-11-30 LAB — INSULIN, 2 HOUR: INSULIN, 2 HOUR: 17.8 u[IU]/mL (ref 0.0–145.4)

## 2016-11-30 LAB — CBC WITH DIFFERENTIAL
BASOS: 1 %
Basophils Absolute: 0 10*3/uL (ref 0.0–0.2)
EOS (ABSOLUTE): 0.2 10*3/uL (ref 0.0–0.4)
EOS: 4 %
HEMATOCRIT: 42 % (ref 34.0–46.6)
HEMOGLOBIN: 13.7 g/dL (ref 11.1–15.9)
Immature Grans (Abs): 0 10*3/uL (ref 0.0–0.1)
Immature Granulocytes: 0 %
LYMPHS ABS: 1.6 10*3/uL (ref 0.7–3.1)
Lymphs: 31 %
MCH: 29.2 pg (ref 26.6–33.0)
MCHC: 32.6 g/dL (ref 31.5–35.7)
MCV: 90 fL (ref 79–97)
MONOCYTES: 7 %
MONOS ABS: 0.4 10*3/uL (ref 0.1–0.9)
NEUTROS ABS: 2.8 10*3/uL (ref 1.4–7.0)
Neutrophils: 57 %
RBC: 4.69 x10E6/uL (ref 3.77–5.28)
RDW: 14.6 % (ref 12.3–15.4)
WBC: 5 10*3/uL (ref 3.4–10.8)

## 2016-11-30 LAB — VITAMIN D 25 HYDROXY (VIT D DEFICIENCY, FRACTURES): VIT D 25 HYDROXY: 24.7 ng/mL — AB (ref 30.0–100.0)

## 2016-11-30 LAB — COMP. METABOLIC PANEL (12)
A/G RATIO: 1.5 (ref 1.2–2.2)
ALK PHOS: 101 IU/L (ref 39–117)
AST: 19 IU/L (ref 0–40)
Albumin: 4 g/dL (ref 3.5–5.5)
BILIRUBIN TOTAL: 0.4 mg/dL (ref 0.0–1.2)
BUN/Creatinine Ratio: 12 (ref 9–23)
BUN: 10 mg/dL (ref 6–24)
CALCIUM: 9.5 mg/dL (ref 8.7–10.2)
CHLORIDE: 100 mmol/L (ref 96–106)
Creatinine, Ser: 0.85 mg/dL (ref 0.57–1.00)
GFR calc non Af Amer: 75 mL/min/{1.73_m2} (ref >59)
GFR, EST AFRICAN AMERICAN: 87 mL/min/{1.73_m2} (ref >59)
GLOBULIN, TOTAL: 2.7 g/dL (ref 1.5–4.5)
Glucose: 94 mg/dL (ref 65–99)
POTASSIUM: 4.2 mmol/L (ref 3.5–5.2)
Sodium: 140 mmol/L (ref 134–144)
Total Protein: 6.7 g/dL (ref 6.0–8.5)

## 2016-11-30 LAB — LIPID PANEL
Chol/HDL Ratio: 2.8 ratio (ref 0.0–4.4)
Cholesterol, Total: 162 mg/dL (ref 100–199)
HDL: 58 mg/dL (ref >39)
LDL Calculated: 89 mg/dL (ref 0–99)
Triglycerides: 74 mg/dL (ref 0–149)
VLDL CHOLESTEROL CAL: 15 mg/dL (ref 5–40)

## 2016-11-30 LAB — HEMOGLOBIN A1C
Est. average glucose Bld gHb Est-mCnc: 114 mg/dL
HEMOGLOBIN A1C: 5.6 % (ref 4.8–5.6)

## 2016-11-30 LAB — TSH: TSH: 0.938 u[IU]/mL (ref 0.450–4.500)

## 2016-11-30 LAB — VITAMIN B12: VITAMIN B 12: 1365 pg/mL — AB (ref 232–1245)

## 2016-12-01 LAB — PAPIG, HPV, RFX 16/18
HPV, HIGH-RISK: NEGATIVE
PAP SMEAR COMMENT: 0

## 2016-12-01 LAB — PLEASE NOTE

## 2017-01-30 ENCOUNTER — Ambulatory Visit (INDEPENDENT_AMBULATORY_CARE_PROVIDER_SITE_OTHER): Payer: 59 | Admitting: Family Medicine

## 2017-01-30 ENCOUNTER — Encounter: Payer: Self-pay | Admitting: Family Medicine

## 2017-01-30 VITALS — BP 110/70 | HR 74 | Resp 14 | Ht 67.0 in | Wt 323.1 lb

## 2017-01-30 DIAGNOSIS — G4700 Insomnia, unspecified: Secondary | ICD-10-CM

## 2017-01-30 DIAGNOSIS — E8881 Metabolic syndrome: Secondary | ICD-10-CM | POA: Diagnosis not present

## 2017-01-30 DIAGNOSIS — F330 Major depressive disorder, recurrent, mild: Secondary | ICD-10-CM

## 2017-01-30 DIAGNOSIS — Z23 Encounter for immunization: Secondary | ICD-10-CM

## 2017-01-30 DIAGNOSIS — I10 Essential (primary) hypertension: Secondary | ICD-10-CM

## 2017-01-30 DIAGNOSIS — E785 Hyperlipidemia, unspecified: Secondary | ICD-10-CM | POA: Diagnosis not present

## 2017-01-30 MED ORDER — VENLAFAXINE HCL ER 75 MG PO CP24: 75.0000 mg | ORAL_CAPSULE | Freq: Every day | ORAL | 1 refills | Status: DC

## 2017-01-30 MED ORDER — PHENTERMINE-TOPIRAMATE ER 7.5-46 MG PO CP24: 1.0000 | ORAL_CAPSULE | Freq: Every day | ORAL | 2 refills | Status: DC

## 2017-01-30 MED ORDER — ATORVASTATIN CALCIUM 40 MG PO TABS: ORAL_TABLET | ORAL | 1 refills | Status: DC

## 2017-01-30 MED ORDER — ATENOLOL-CHLORTHALIDONE 50-25 MG PO TABS: ORAL_TABLET | ORAL | 0 refills | Status: DC

## 2017-01-30 MED ORDER — AMLODIPINE BESY-BENAZEPRIL HCL 5-20 MG PO CAPS: 1.0000 | ORAL_CAPSULE | Freq: Every day | ORAL | 1 refills | Status: DC

## 2017-01-30 MED ORDER — TRAZODONE HCL 100 MG PO TABS: 100.0000 mg | ORAL_TABLET | Freq: Every day | ORAL | 1 refills | Status: DC

## 2017-01-30 NOTE — Progress Notes (Addendum)
Name: Emma Oconnor   MRN: [id number]    DOB: 1956/02/05   Date:01/30/2017       Progress Note  Subjective  Chief Complaint  Chief Complaint  Patient presents with  . Hypertension  . Hyperglycemia  . Obesity    HPI  HTN: taking bp medication  daily and denies side effects, however bp towards low end of normal.  No chest pain or palpitation.We will decrease dose of Tenoretic and monitor.  Pre-diabetes: hgbA1C was 5.8%, she denies polydipsia or polyuria, she has episodes of polyphagia. Discussed GLP-1 agonist to see if it would help her lose weight but she wants to hold off for now, she does not like shots. . She is trying to eat healthier and exercising more, she is on Qsymia now ( since Oct 2018) and is doing well.   Hyperlipidemia: taking Atorvastatin as prescribed. Denies side effects of medications, no myalgia, labs reviewed with patient today.   Insomnia: Trazodone helps her fall asleep and has been able to stay asleep, states hip pain has improved and no longer waking her up every night.   Obesity: she was 352.6 lbs in March 2015, she was started on Qsymia and lost over 5 % of original weight, she had a gap because her husband lost his job Dec 2015. Resumed medication at weight of 342 lbs in July 2016 . She states she stopped Qsymia because it stopped working around 11/2015 at 323.3 lbs, she gained some weight again and we resumed medication medication 11/2016 at weight of 336lbs. She is doing well, down 13 lbs in the past 2 month.   OSA: wears CPAP every night now. She wakes up feeling rested when she is able to sleep all night.   Depression Major: she used to take medication many years ago. She states since Summer 2017 her husband has been living out of town, for his job, initially it was nice, but she started to feel lonely she was having occasional crying spells and was feeling sad, we gave her a rx of Effexor 11/2015. She states she is doing better since she has been on  medication.  Patient Active Problem List   Diagnosis Date Noted  . Benign neoplasm of ascending colon   . Benign neoplasm of sigmoid colon   . Benign essential HTN 10/25/2014  . Bursitis of shoulder 10/25/2014  . Insomnia, persistent 10/25/2014  . Dyslipidemia 10/25/2014  . Female stress incontinence 10/25/2014  . Eczema intertrigo 10/25/2014  . Menopause 10/25/2014  . Dysmetabolic syndrome 13/24/4010  . Extreme obesity 10/25/2014  . Obstructive apnea 10/25/2014  . Vitamin D deficiency 10/25/2014  . Skin tag 10/25/2014  . Knee pain 10/25/2014    Past Surgical History:  Procedure Laterality Date  . COLONOSCOPY WITH PROPOFOL N/A 02/17/2015   Procedure: COLONOSCOPY WITH PROPOFOL;  Surgeon: Lucilla Lame, MD;  Location: [facility] ENDOSCOPY;  Service: Endoscopy;  Laterality: N/A;  . TUBAL LIGATION      Family History  Problem Relation Age of Onset  . Diabetes Mother   . Hypertension Mother   . Hyperlipidemia Mother   . Multiple sclerosis Brother   . Diabetes Brother     Social History   Social History  . Marital status: Married    Spouse name: N/A  . Number of children: N/A  . Years of education: N/A   Occupational History  . Not on file.   Social History Main Topics  . Smoking status: Former Smoker    Packs/day: 0.50  Years: 30.00    Types: Cigarettes    Start date: 04/19/1975    Quit date: 12/17/2005  . Smokeless tobacco: Never Used  . Alcohol use No  . Drug use: No  . Sexual activity: Not Currently   Other Topics Concern  . Not on file   Social History Narrative   Married.   Husband has been working out of town.   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  acetaminophen (TYLENOL) 500 MG tablet, Take 1 tablet (500 mg total) by mouth every 8 (eight) hours as needed., Disp: 90 tablet, Rfl: 0 .  aspirin EC 81 MG tablet, Take 1 tablet (81 mg total) by mouth daily., Disp: 30 tablet, Rfl: 0 .   traMADol (ULTRAM) 50 MG tablet, Take 1 tablet (50 mg total) by mouth every 8 (eight) hours as needed., Disp: 90 tablet, Rfl: 0 .  amLODipine-benazepril (LOTREL) 5-20 MG capsule, Take 1 capsule by mouth daily., Disp: 90 capsule, Rfl: 1 .  atenolol-chlorthalidone (TENORETIC) 50-25 MG tablet, Half to  1 tablet by mouth  daily, Disp: 90 tablet, Rfl: 0 .  atorvastatin (LIPITOR) 40 MG tablet, Take 1 tablet by mouth  daily at 6pm, Disp: 90 tablet, Rfl: 1 .  Phentermine-Topiramate (QSYMIA) 7.5-46 MG CP24, Take 1 capsule by mouth daily., Disp: 30 capsule, Rfl: 2 .  traZODone (DESYREL) 100 MG tablet, Take 1 tablet (100 mg total) by mouth at bedtime., Disp: 90 tablet, Rfl: 1 .  venlafaxine XR (EFFEXOR-XR) 75 MG 24 hr capsule, Take 1 capsule (75 mg total) by mouth daily with breakfast., Disp: 90 capsule, Rfl: 1  No Known Allergies   ROS  Constitutional: Negative for fever . Positive for  weight change.  Respiratory: Negative for cough and shortness of breath.   Cardiovascular: Negative for chest pain or palpitations.  Gastrointestinal: Negative for abdominal pain, no bowel changes.  Musculoskeletal: Negative for gait problem or joint swelling.  Skin: Negative for rash.  Neurological: Negative for dizziness or headache.  No other specific complaints in a complete review of systems (except as listed in HPI above).  Objective  Vitals:   01/30/17 0754  BP: 110/70  Pulse: 74  Resp: 14  SpO2: 96%  Weight: (!) 323 lb 1.6 oz (146.6 kg)  Height: 5\' 7"  (1.702 m)    Body mass index is 50.6 kg/m.  Physical Exam  Constitutional: Patient appears well-developed and well-nourished. Obese No distress.  HEENT: head atraumatic, normocephalic, pupils equal and reactive to light, neck supple, throat within normal limits Cardiovascular: Normal rate, regular rhythm and normal heart sounds.  No murmur heard.  Trace BLE edema. Pulmonary/Chest: Effort normal and breath sounds normal. No respiratory  distress. Abdominal: Soft.  There is no tenderness. Psychiatric: Patient has a normal mood and affect. behavior is normal. Judgment and thought content normal.  Recent Results (from the past 2160 hour(s))  PapIG, HPV, rfx 16/18     Status: None   Collection Time: 11/29/16 12:00 AM  Result Value Ref Range   DIAGNOSIS: Comment     Comment: NEGATIVE FOR INTRAEPITHELIAL LESION AND MALIGNANCY.   Specimen adequacy: Comment     Comment: Satisfactory for evaluation. No endocervical component is identified.   Clinician Provided ICD10 Comment     Comment: Z12.4   Performed by: Comment     Comment: Windell Norfolk, Cytotechnologist (ASCP)   PAP Smear Comment .    Note: Comment  Comment: The Pap smear is a screening test designed to aid in the detection of premalignant and malignant conditions of the uterine cervix.  It is not a diagnostic procedure and should not be used as the sole means of detecting cervical cancer.  Both false-positive and false-negative reports do occur.    Test Methodology Comment     Comment: This liquid based ThinPrep(R) pap test was screened with the use of an image guided system.    HPV, high-risk Negative Negative    Comment: This high-risk HPV test detects thirteen high-risk types (16/18/31/33/35/39/45/51/52/56/58/59/68) without differentiation.   Please Note     Status: None   Collection Time: 11/29/16 12:00 AM  Result Value Ref Range   Please Note: Comment     Comment: The date and/or time of collection was not indicated on the requisition as required by state and federal law.  The date of receipt of the specimen was used as the collection date if not supplied.   CBC With Differential     Status: None   Collection Time: 11/29/16  9:54 AM  Result Value Ref Range   WBC 5.0 3.4 - 10.8 x10E3/uL   RBC 4.69 3.77 - 5.28 x10E6/uL   Hemoglobin 13.7 11.1 - 15.9 g/dL   Hematocrit 42.0 34.0 - 46.6 %   MCV 90 79 - 97 fL   MCH 29.2 26.6 - 33.0 pg   MCHC 32.6  31.5 - 35.7 g/dL   RDW 14.6 12.3 - 15.4 %   Neutrophils 57 Not Estab. %   Lymphs 31 Not Estab. %   Monocytes 7 Not Estab. %   Eos 4 Not Estab. %   Basos 1 Not Estab. %   Neutrophils Absolute 2.8 1.4 - 7.0 x10E3/uL   Lymphocytes Absolute 1.6 0.7 - 3.1 x10E3/uL   Monocytes Absolute 0.4 0.1 - 0.9 x10E3/uL   EOS (ABSOLUTE) 0.2 0.0 - 0.4 x10E3/uL   Basophils Absolute 0.0 0.0 - 0.2 x10E3/uL   Immature Granulocytes 0 Not Estab. %   Immature Grans (Abs) 0.0 0.0 - 0.1 x10E3/uL  Comp. Metabolic Panel (12)     Status: None   Collection Time: 11/29/16  9:54 AM  Result Value Ref Range   Glucose 94 65 - 99 mg/dL   BUN 10 6 - 24 mg/dL   Creatinine, Ser 0.85 0.57 - 1.00 mg/dL   GFR calc non Af Amer 75 >59 mL/min/1.73   GFR calc Af Amer 87 >59 mL/min/1.73   BUN/Creatinine Ratio 12 9 - 23   Sodium 140 134 - 144 mmol/L   Potassium 4.2 3.5 - 5.2 mmol/L   Chloride 100 96 - 106 mmol/L   Calcium 9.5 8.7 - 10.2 mg/dL   Total Protein 6.7 6.0 - 8.5 g/dL   Albumin 4.0 3.5 - 5.5 g/dL   Globulin, Total 2.7 1.5 - 4.5 g/dL   Albumin/Globulin Ratio 1.5 1.2 - 2.2   Bilirubin Total 0.4 0.0 - 1.2 mg/dL   Alkaline Phosphatase 101 39 - 117 IU/L   AST 19 0 - 40 IU/L  Hemoglobin A1c     Status: None   Collection Time: 11/29/16  9:54 AM  Result Value Ref Range   Hgb A1c MFr Bld 5.6 4.8 - 5.6 %    Comment:          Prediabetes: 5.7 - 6.4          Diabetes: >6.4          Glycemic control for adults with diabetes: <7.0  Est. average glucose Bld gHb Est-mCnc 114 mg/dL  Insulin, 2 Hour     Status: None   Collection Time: 11/29/16  9:54 AM  Result Value Ref Range   Insulin, 2 hour 17.8 0.0 - 145.4 uIU/mL  Lipid panel     Status: None   Collection Time: 11/29/16  9:54 AM  Result Value Ref Range   Cholesterol, Total 162 100 - 199 mg/dL   Triglycerides 74 0 - 149 mg/dL   HDL 58 >39 mg/dL   VLDL Cholesterol Cal 15 5 - 40 mg/dL   LDL Calculated 89 0 - 99 mg/dL   Chol/HDL Ratio 2.8 0.0 - 4.4 ratio    Comment:                                    T. Chol/HDL Ratio                                             Men  Women                               1/2 Avg.Risk  3.4    3.3                                   Avg.Risk  5.0    4.4                                2X Avg.Risk  9.6    7.1                                3X Avg.Risk 23.4   11.0   TSH     Status: None   Collection Time: 11/29/16  9:54 AM  Result Value Ref Range   TSH 0.938 0.450 - 4.500 uIU/mL  Vitamin B12     Status: Abnormal   Collection Time: 11/29/16  9:54 AM  Result Value Ref Range   Vitamin B-12 1,365 (H) 232 - 1,245 pg/mL  VITAMIN D 25 Hydroxy (Vit-D Deficiency, Fractures)     Status: Abnormal   Collection Time: 11/29/16  9:54 AM  Result Value Ref Range   Vit D, 25-Hydroxy 24.7 (L) 30.0 - 100.0 ng/mL    Comment: Vitamin D deficiency has been defined by the [place] and an Endocrine Society practice guideline as a level of serum 25-OH vitamin D less than 20 ng/mL (1,2). The Endocrine Society went on to further define vitamin D insufficiency as a level between 21 and 29 ng/mL (2). 1. IOM (Institute of Medicine). 2010. Dietary reference    intakes for calcium and D. [place]: The    [employer]. 2. Holick MF, Binkley Liberal, Bischoff-Ferrari HA, et al.    Evaluation, treatment, and prevention of vitamin D    deficiency: an Endocrine Society clinical practice    guideline. JCEM. 2011 Jul; 96(7):1911-30.      PHQ2/9: Depression screen [facility] Show Low 2/9 11/29/2016 02/18/2016 12/08/2015 10/01/2015 06/30/2015  Decreased Interest 0 0 0 0 0  Down, Depressed, Hopeless  0 0 0 0 0  PHQ - 2 Score 0 0 0 0 0     Fall Risk: Fall Risk  11/29/2016 02/18/2016 12/08/2015 10/01/2015 06/30/2015  Falls in the past year? No No No No No    Assessment & Plan  1. Benign essential HTN  - amLODipine-benazepril (LOTREL) 5-20 MG capsule; Take 1 capsule by mouth daily.  Dispense: 90 capsule; Refill: 1 - atenolol-chlorthalidone (TENORETIC)  50-25 MG tablet; Half to  1 tablet by mouth  daily  Dispense: 90 tablet; Refill: 0 bp towards low end of normal, we will cut dose in half and monitor  2. Dyslipidemia  - atorvastatin (LIPITOR) 40 MG tablet; Take 1 tablet by mouth  daily at 6pm  Dispense: 90 tablet; Refill: 1  3. Morbid obesity due to excess calories (HCC)  - Phentermine-Topiramate (QSYMIA) 7.5-46 MG CP24; Take 1 capsule by mouth daily.  Dispense: 30 capsule; Refill: 2  4. Insomnia, persistent  - traZODone (DESYREL) 100 MG tablet; Take 1 tablet (100 mg total) by mouth at bedtime.  Dispense: 90 tablet; Refill: 1  5. Mild episode of recurrent major depressive disorder (HCC)  - venlafaxine XR (EFFEXOR-XR) 75 MG 24 hr capsule; Take 1 capsule (75 mg total) by mouth daily with breakfast.  Dispense: 90 capsule; Refill: 1  6. Flu vaccine need  refused  7. Need for Tdap vaccination  Refused  8. Dysmetabolic syndrome  Continue life style modification

## 2017-02-03 DIAGNOSIS — N3090 Cystitis, unspecified without hematuria: Secondary | ICD-10-CM | POA: Diagnosis not present

## 2017-02-06 ENCOUNTER — Ambulatory Visit
Admission: RE | Admit: 2017-02-06 | Discharge: 2017-02-06 | Disposition: A | Discharge status: Home / Self Care | Payer: 59 | Source: Ambulatory Visit | Attending: Family Medicine | Admitting: Family Medicine

## 2017-02-06 DIAGNOSIS — Z1239 Encounter for other screening for malignant neoplasm of breast: Secondary | ICD-10-CM

## 2017-02-06 DIAGNOSIS — Z1231 Encounter for screening mammogram for malignant neoplasm of breast: Secondary | ICD-10-CM | POA: Diagnosis not present

## 2017-05-29 DIAGNOSIS — N951 Menopausal and female climacteric states: Secondary | ICD-10-CM | POA: Diagnosis not present

## 2017-05-29 DIAGNOSIS — R635 Abnormal weight gain: Secondary | ICD-10-CM | POA: Diagnosis not present

## 2017-05-30 DIAGNOSIS — R7303 Prediabetes: Secondary | ICD-10-CM | POA: Diagnosis not present

## 2017-05-30 DIAGNOSIS — I10 Essential (primary) hypertension: Secondary | ICD-10-CM | POA: Diagnosis not present

## 2017-05-30 DIAGNOSIS — E782 Mixed hyperlipidemia: Secondary | ICD-10-CM | POA: Diagnosis not present

## 2017-06-06 DIAGNOSIS — R7303 Prediabetes: Secondary | ICD-10-CM | POA: Diagnosis not present

## 2017-06-06 DIAGNOSIS — I10 Essential (primary) hypertension: Secondary | ICD-10-CM | POA: Diagnosis not present

## 2017-06-13 DIAGNOSIS — Z8719 Personal history of other diseases of the digestive system: Secondary | ICD-10-CM | POA: Diagnosis not present

## 2017-06-13 DIAGNOSIS — E782 Mixed hyperlipidemia: Secondary | ICD-10-CM | POA: Diagnosis not present

## 2017-06-13 DIAGNOSIS — I10 Essential (primary) hypertension: Secondary | ICD-10-CM | POA: Diagnosis not present

## 2017-06-15 ENCOUNTER — Ambulatory Visit: Payer: 59 | Admitting: Family Medicine

## 2017-06-15 ENCOUNTER — Encounter: Payer: Self-pay | Admitting: Family Medicine

## 2017-06-15 VITALS — BP 116/68 | HR 78 | Temp 98.2°F | Resp 16 | Ht 67.0 in | Wt 323.8 lb

## 2017-06-15 DIAGNOSIS — I10 Essential (primary) hypertension: Secondary | ICD-10-CM

## 2017-06-15 DIAGNOSIS — M25561 Pain in right knee: Secondary | ICD-10-CM | POA: Diagnosis not present

## 2017-06-15 DIAGNOSIS — G4700 Insomnia, unspecified: Secondary | ICD-10-CM | POA: Diagnosis not present

## 2017-06-15 DIAGNOSIS — E8881 Metabolic syndrome: Secondary | ICD-10-CM

## 2017-06-15 DIAGNOSIS — Z23 Encounter for immunization: Secondary | ICD-10-CM | POA: Diagnosis not present

## 2017-06-15 DIAGNOSIS — M25562 Pain in left knee: Secondary | ICD-10-CM | POA: Diagnosis not present

## 2017-06-15 DIAGNOSIS — G4733 Obstructive sleep apnea (adult) (pediatric): Secondary | ICD-10-CM

## 2017-06-15 DIAGNOSIS — F330 Major depressive disorder, recurrent, mild: Secondary | ICD-10-CM | POA: Diagnosis not present

## 2017-06-15 DIAGNOSIS — E785 Hyperlipidemia, unspecified: Secondary | ICD-10-CM | POA: Diagnosis not present

## 2017-06-15 MED ORDER — AMLODIPINE BESY-BENAZEPRIL HCL 5-20 MG PO CAPS: 1.0000 | ORAL_CAPSULE | Freq: Every day | ORAL | 1 refills | Status: DC

## 2017-06-15 MED ORDER — ATORVASTATIN CALCIUM 40 MG PO TABS: ORAL_TABLET | ORAL | 1 refills | Status: DC

## 2017-06-15 MED ORDER — TRAZODONE HCL 100 MG PO TABS: 100.0000 mg | ORAL_TABLET | Freq: Every day | ORAL | 1 refills | Status: DC

## 2017-06-15 MED ORDER — ATENOLOL-CHLORTHALIDONE 50-25 MG PO TABS: ORAL_TABLET | ORAL | 1 refills | Status: DC

## 2017-06-15 MED ORDER — TRAMADOL HCL 50 MG PO TABS: 50.0000 mg | ORAL_TABLET | Freq: Three times a day (TID) | ORAL | 0 refills | Status: DC | PRN

## 2017-06-15 MED ORDER — VENLAFAXINE HCL ER 75 MG PO CP24: 75.0000 mg | ORAL_CAPSULE | Freq: Every day | ORAL | 1 refills | Status: DC

## 2017-06-15 MED ORDER — PHENTERMINE-TOPIRAMATE ER 11.25-69 MG PO CP24: 1.0000 | ORAL_CAPSULE | ORAL | 2 refills | Status: DC

## 2017-06-15 NOTE — Progress Notes (Signed)
Name: Emma Oconnor   MRN: [id number]    DOB: Aug 23, 1955   Date:06/15/2017       Progress Note  Subjective  Chief Complaint  Chief Complaint  Patient presents with  . Medication Refill  . Hypertension    Denies any symptoms  . Hyperlipidemia  . Insomnia    Doing well with CPAP, states sleep can be altered due to pain in bilateral hips or temperature in rooms.   . Obesity  . Sleep Apnea    Sleeps on average 5 to 6 hours nightly  . Depression    HPI  HTN: taking bp medication  daily and denies side effects, however bp towards low end of normal.  No chest pain, no dizziness or palpitation.Advised to take half dose of Tenoretic.   Pre-diabetes: hgbA1C was 5.8%, she denies polydipsia or polyuria, she has episodes of polyphagia. Discussed GLP-1 agonist to see if it would help her lose weight but she wants to hold off for now, she does not like shots. She is trying to eat healthier and exercising more, she is on Qsymia now ( since Oct 2018) , the weight is stable now, we will try to increase dose of Qsymia   Hyperlipidemia: taking Atorvastatin as prescribed. Denies side effects of medications, no myalgia  Insomnia: Trazodone helps her fall asleep, but hip pain seems to wake her up during the night, advised to resume Tramadol and also add Tylenol .   Obesity: she was 352.6 lbs in March 2015, she was started on Qsymia and lost over 5 % of original weight, she had a gap because her husband lost his job Dec 2015. Resumed medication at weight of 342 lbs in July 2016 . She states she stopped Qsymia because it stopped working around 11/2015 at 323.3 lbs, she gained some weight again and we resumed medication medication 11/2016 at weight of 336lbs, she lost 13 lbs in the first 2 months, but weight has been stable at 323.8 lbs. We will adjust dose of Qsymia   OSA: wears CPAP every night now. She wakes up feeling rested when she is able to sleep all night.   Depression Major: she used to  take medication many years ago. She states since Summer 2017 her husband has been living out of town, for his job, initially it was nice, but she started to feel lonely she was having occasional crying spells and was feeling sad, we gave her a rx of Effexor 11/2015. She states she is doing better since she has been on medication. She has been feeling very tired because she is working full time and also was taking 3 classes last semester. She was feeling stressed out, so she is down to one class this semester. Able to clean her house again. .    Patient Active Problem List   Diagnosis Date Noted  . Benign neoplasm of ascending colon   . Benign neoplasm of sigmoid colon   . Benign essential HTN 10/25/2014  . Bursitis of shoulder 10/25/2014  . Insomnia, persistent 10/25/2014  . Dyslipidemia 10/25/2014  . Female stress incontinence 10/25/2014  . Eczema intertrigo 10/25/2014  . Menopause 10/25/2014  . Dysmetabolic syndrome 97/35/3299  . Extreme obesity 10/25/2014  . Obstructive apnea 10/25/2014  . Vitamin D deficiency 10/25/2014  . Skin tag 10/25/2014  . Knee pain 10/25/2014    Past Surgical History:  Procedure Laterality Date  . COLONOSCOPY WITH PROPOFOL N/A 02/17/2015   Procedure: COLONOSCOPY WITH PROPOFOL;  Surgeon: Lucilla Lame,  MD;  Location: [facility] ENDOSCOPY;  Service: Endoscopy;  Laterality: N/A;  . TUBAL LIGATION      Family History  Problem Relation Age of Onset  . Diabetes Mother   . Hypertension Mother   . Hyperlipidemia Mother   . Multiple sclerosis Brother   . Diabetes Brother     Social History   Socioeconomic History  . Marital status: Married    Spouse name: Not on file  . Number of children: Not on file  . Years of education: Not on file  . Highest education level: Not on file  Social Needs  . Financial resource strain: Not on file  . Food insecurity - worry: Not on file  . Food insecurity - inability: Not on file  . Transportation needs - medical: Not on  file  . Transportation needs - non-medical: Not on file  Occupational History  . Not on file  Tobacco Use  . Smoking status: Former Smoker    Packs/day: 0.50    Years: 30.00    Pack years: 15.00    Types: Cigarettes    Start date: 04/19/1975    Last attempt to quit: 12/17/2005    Years since quitting: 11.5  . Smokeless tobacco: Never Used  Substance and Sexual Activity  . Alcohol use: No    Alcohol/week: 0.0 oz  . Drug use: No  . Sexual activity: Not Currently  Other Topics Concern  . Not on file  Social History Narrative   Married.   Husband has been working out of town.   She is taking online classes trying to finish her Psychology degree     Current Outpatient Medications:  .  acetaminophen (TYLENOL) 500 MG tablet, Take 1 tablet (500 mg total) by mouth every 8 (eight) hours as needed., Disp: 90 tablet, Rfl: 0 .  amLODipine-benazepril (LOTREL) 5-20 MG capsule, Take 1 capsule by mouth daily., Disp: 90 capsule, Rfl: 1 .  aspirin EC 81 MG tablet, Take 1 tablet (81 mg total) by mouth daily., Disp: 30 tablet, Rfl: 0 .  atenolol-chlorthalidone (TENORETIC) 50-25 MG tablet, Half to  1 tablet by mouth  daily, Disp: 90 tablet, Rfl: 1 .  atorvastatin (LIPITOR) 40 MG tablet, Take 1 tablet by mouth  daily at 6pm, Disp: 90 tablet, Rfl: 1 .  traMADol (ULTRAM) 50 MG tablet, Take 1 tablet (50 mg total) by mouth every 8 (eight) hours as needed., Disp: 60 tablet, Rfl: 0 .  traZODone (DESYREL) 100 MG tablet, Take 1 tablet (100 mg total) by mouth at bedtime., Disp: 90 tablet, Rfl: 1 .  venlafaxine XR (EFFEXOR-XR) 75 MG 24 hr capsule, Take 1 capsule (75 mg total) by mouth daily with breakfast., Disp: 90 capsule, Rfl: 1 .  Phentermine-Topiramate 11.25-69 MG CP24, Take 1 capsule by mouth every morning., Disp: 30 capsule, Rfl: 2  No Known Allergies   ROS  Constitutional: Negative for fever or weight change.  Respiratory: Negative for cough and shortness of breath.   Cardiovascular: Negative for  chest pain or palpitations.  Gastrointestinal: Negative for abdominal pain, no bowel changes.  Musculoskeletal: Negative for gait problem or joint swelling.  Skin: Negative for rash.  Neurological: Negative for dizziness or headache.  No other specific complaints in a complete review of systems (except as listed in HPI above).  Objective  Vitals:   06/15/17 1055  BP: 116/68  Pulse: 78  Resp: 16  Temp: 98.2 F (36.8 C)  TempSrc: Oral  SpO2: 98%  Weight: (!) 323 lb  12.8 oz (146.9 kg)  Height: 5\' 7"  (1.702 m)    Body mass index is 50.71 kg/m.  Physical Exam  Constitutional: Patient appears well-developed and well-nourished. Obese  No distress.  HEENT: head atraumatic, normocephalic, pupils equal and reactive to light,neck supple, throat within normal limits Cardiovascular: Normal rate, regular rhythm and normal heart sounds.  No murmur heard. No BLE edema. Pulmonary/Chest: Effort normal and breath sounds normal. No respiratory distress. Abdominal: Soft.  There is no tenderness. Psychiatric: Patient has a normal mood and affect. behavior is normal. Judgment and thought content normal.  PHQ2/9: Depression screen [facility] 2/9 06/15/2017 06/15/2017 11/29/2016 02/18/2016 12/08/2015  Decreased Interest 0 0 0 0 0  Down, Depressed, Hopeless 0 0 0 0 0  PHQ - 2 Score 0 0 0 0 0  Altered sleeping 1 - - - -  Tired, decreased energy 1 - - - -  Change in appetite 0 - - - -  Feeling bad or failure about yourself  0 - - - -  Trouble concentrating 0 - - - -  Moving slowly or fidgety/restless 0 - - - -  Suicidal thoughts 0 - - - -  PHQ-9 Score 2 - - - -  Difficult doing work/chores Not difficult at all - - - -     Fall Risk: Fall Risk  06/15/2017 11/29/2016 02/18/2016 12/08/2015 10/01/2015  Falls in the past year? No No No No No    Functional Status Survey: Is the patient deaf or have difficulty hearing?: No Does the patient have difficulty seeing, even when wearing glasses/contacts?: No Does  the patient have difficulty concentrating, remembering, or making decisions?: No Does the patient have difficulty walking or climbing stairs?: No Does the patient have difficulty dressing or bathing?: No Does the patient have difficulty doing errands alone such as visiting a doctor's office or shopping?: No   Assessment & Plan  1. Benign essential HTN  - amLODipine-benazepril (LOTREL) 5-20 MG capsule; Take 1 capsule by mouth daily.  Dispense: 90 capsule; Refill: 1 - atenolol-chlorthalidone (TENORETIC) 50-25 MG tablet; Half to  1 tablet by mouth  daily  Dispense: 90 tablet; Refill: 1  2. Dyslipidemia  - atorvastatin (LIPITOR) 40 MG tablet; Take 1 tablet by mouth  daily at 6pm  Dispense: 90 tablet; Refill: 1  3. Insomnia, persistent  - traZODone (DESYREL) 100 MG tablet; Take 1 tablet (100 mg total) by mouth at bedtime.  Dispense: 90 tablet; Refill: 1  4. Morbid obesity due to excess calories (HCC)  - Phentermine-Topiramate 11.25-69 MG CP24; Take 1 capsule by mouth every morning.  Dispense: 30 capsule; Refill: 2  5. Mild episode of recurrent major depressive disorder (HCC)  - venlafaxine XR (EFFEXOR-XR) 75 MG 24 hr capsule; Take 1 capsule (75 mg total) by mouth daily with breakfast.  Dispense: 90 capsule; Refill: 1  6. Dysmetabolic syndrome   7. Obstructive apnea   8. Arthralgia of both knees  - traMADol (ULTRAM) 50 MG tablet; Take 1 tablet (50 mg total) by mouth every 8 (eight) hours as needed.  Dispense: 60 tablet; Refill: 0  9. Need for Tdap vaccination  Out of stock, we will give it to her next visit

## 2017-06-20 DIAGNOSIS — E559 Vitamin D deficiency, unspecified: Secondary | ICD-10-CM | POA: Diagnosis not present

## 2017-06-20 DIAGNOSIS — R7303 Prediabetes: Secondary | ICD-10-CM | POA: Diagnosis not present

## 2017-06-20 DIAGNOSIS — I10 Essential (primary) hypertension: Secondary | ICD-10-CM | POA: Diagnosis not present

## 2017-06-27 DIAGNOSIS — E782 Mixed hyperlipidemia: Secondary | ICD-10-CM | POA: Diagnosis not present

## 2017-06-27 DIAGNOSIS — R7303 Prediabetes: Secondary | ICD-10-CM | POA: Diagnosis not present

## 2017-06-27 DIAGNOSIS — I10 Essential (primary) hypertension: Secondary | ICD-10-CM | POA: Diagnosis not present

## 2017-07-04 DIAGNOSIS — I10 Essential (primary) hypertension: Secondary | ICD-10-CM | POA: Diagnosis not present

## 2017-07-04 DIAGNOSIS — R7303 Prediabetes: Secondary | ICD-10-CM | POA: Diagnosis not present

## 2017-12-01 ENCOUNTER — Encounter: Payer: Self-pay | Admitting: Family Medicine

## 2017-12-01 ENCOUNTER — Ambulatory Visit: Payer: 59 | Admitting: Family Medicine

## 2017-12-01 VITALS — BP 118/70 | HR 65 | Temp 98.3°F | Resp 14 | Ht 67.0 in | Wt 319.4 lb

## 2017-12-01 DIAGNOSIS — F331 Major depressive disorder, recurrent, moderate: Secondary | ICD-10-CM | POA: Diagnosis not present

## 2017-12-01 DIAGNOSIS — G4733 Obstructive sleep apnea (adult) (pediatric): Secondary | ICD-10-CM

## 2017-12-01 DIAGNOSIS — D229 Melanocytic nevi, unspecified: Secondary | ICD-10-CM

## 2017-12-01 DIAGNOSIS — G4700 Insomnia, unspecified: Secondary | ICD-10-CM

## 2017-12-01 DIAGNOSIS — E785 Hyperlipidemia, unspecified: Secondary | ICD-10-CM

## 2017-12-01 DIAGNOSIS — E559 Vitamin D deficiency, unspecified: Secondary | ICD-10-CM

## 2017-12-01 DIAGNOSIS — M25561 Pain in right knee: Secondary | ICD-10-CM

## 2017-12-01 DIAGNOSIS — I10 Essential (primary) hypertension: Secondary | ICD-10-CM

## 2017-12-01 DIAGNOSIS — M25562 Pain in left knee: Secondary | ICD-10-CM

## 2017-12-01 DIAGNOSIS — E8881 Metabolic syndrome: Secondary | ICD-10-CM

## 2017-12-01 DIAGNOSIS — Z23 Encounter for immunization: Secondary | ICD-10-CM | POA: Diagnosis not present

## 2017-12-01 MED ORDER — PHENTERMINE-TOPIRAMATE ER 11.25-69 MG PO CP24: 1.0000 | ORAL_CAPSULE | ORAL | 2 refills | Status: DC

## 2017-12-01 MED ORDER — HYDROCHLOROTHIAZIDE 12.5 MG PO TABS: 12.5000 mg | ORAL_TABLET | Freq: Every day | ORAL | 1 refills | Status: DC

## 2017-12-01 MED ORDER — ATORVASTATIN CALCIUM 40 MG PO TABS: ORAL_TABLET | ORAL | 1 refills | Status: DC

## 2017-12-01 MED ORDER — AMLODIPINE BESY-BENAZEPRIL HCL 5-20 MG PO CAPS: 1.0000 | ORAL_CAPSULE | Freq: Every day | ORAL | 1 refills | Status: DC

## 2017-12-01 MED ORDER — VENLAFAXINE HCL ER 75 MG PO CP24: 75.0000 mg | ORAL_CAPSULE | Freq: Every day | ORAL | 1 refills | Status: DC

## 2017-12-01 MED ORDER — TRAZODONE HCL 100 MG PO TABS: 100.0000 mg | ORAL_TABLET | Freq: Every day | ORAL | 1 refills | Status: DC

## 2017-12-01 NOTE — Progress Notes (Signed)
Name: Emma Oconnor   MRN: [id number]    DOB: 1956-01-27   Date:12/01/2017       Progress Note  Subjective  Chief Complaint  Chief Complaint  Patient presents with  . Medication Refill  . Hypertension  . Hyperlipidemia  . Insomnia  . Obesity  . Sleep Apnea  . Depression  . prediabetes    HPI  HTN: taking bpmedicationdaily and denies side effects, however bp towards low end of normal.No chest pain, no dizziness or palpitation.we will adjust dose of medication   Pre-diabetes: hgbA1C was 5.6 %, she denies polydipsia, polyuria or polyphagia. On Qsymia for weight loss and weight is improved.   Hyperlipidemia: taking Atorvastatin as prescribed. Denies side effects of medications, no myalgia. She is due for repeat labs   Insomnia: Trazodone helps her fall asleep, takes it every night,  takes Tramadol prn for pain when affects her sleep. No side effects.   Obesity: she was 352.6 lbs in March 2015, she was started on Qsymia and lost over 5 % of original weight, she had a gap because her husband lost his job Dec 2015. Resumed medication at weight of 342 lbs in July 2016 . She states she stopped Qsymia because it stopped working around 11/2015 at 323.3 lbs, she gained some weight again and we resumed medication medication 11/2016 at weight of 336lbs, she is still on medication and weight is down to 319. 4 lbs. She is tolerating medication well.   OSA: wears CPAP every night now. She has been sleeping better.   Depression Major: she has a long history of depression and much worse now. She lost her daughter at age 80 yo from a MVA, and her niece a couple of months and is raising her daughter's 3 children and also her nieces daughter. She is going to grieve counseling. She feels overwhelmed and very sad.   Patient Active Problem List   Diagnosis Date Noted  . Benign neoplasm of ascending colon   . Benign neoplasm of sigmoid colon   . Benign essential HTN 10/25/2014  . Bursitis  of shoulder 10/25/2014  . Insomnia, persistent 10/25/2014  . Dyslipidemia 10/25/2014  . Female stress incontinence 10/25/2014  . Eczema intertrigo 10/25/2014  . Menopause 10/25/2014  . Dysmetabolic syndrome 40/98/1191  . Extreme obesity 10/25/2014  . Obstructive apnea 10/25/2014  . Vitamin D deficiency 10/25/2014  . Skin tag 10/25/2014  . Knee pain 10/25/2014    Past Surgical History:  Procedure Laterality Date  . COLONOSCOPY WITH PROPOFOL N/A 02/17/2015   Procedure: COLONOSCOPY WITH PROPOFOL;  Surgeon: Lucilla Lame, MD;  Location: [facility] ENDOSCOPY;  Service: Endoscopy;  Laterality: N/A;  . TUBAL LIGATION      Family History  Problem Relation Age of Onset  . Diabetes Mother   . Hypertension Mother   . Hyperlipidemia Mother   . Multiple sclerosis Brother   . Diabetes Brother     Social History   Socioeconomic History  . Marital status: Married    Spouse name: Lennette Bihari  . Number of children: 3  . Years of education: Not on file  . Highest education level: High school graduate  Occupational History  . Not on file  Social Needs  . Financial resource strain: Not hard at all  . Food insecurity:    Worry: Never true    Inability: Never true  . Transportation needs:    Medical: No    Non-medical: No  Tobacco Use  . Smoking status: Former Smoker  Packs/day: 0.50    Years: 30.00    Pack years: 15.00    Types: Cigarettes    Start date: 04/19/1975    Last attempt to quit: 12/17/2005    Years since quitting: 11.9  . Smokeless tobacco: Never Used  Substance and Sexual Activity  . Alcohol use: No    Alcohol/week: 0.0 standard drinks  . Drug use: No  . Sexual activity: Not Currently  Lifestyle  . Physical activity:    Days per week: 3 days    Minutes per session: 30 min  . Stress: Rather much  Relationships  . Social connections:    Talks on phone: Three times a week    Gets together: Never    Attends religious service: 1 to 4 times per year    Active member of club or  organization: No    Attends meetings of clubs or organizations: Never    Relationship status: Married  . Intimate partner violence:    Fear of current or ex partner: No    Emotionally abused: No    Physically abused: No    Forced sexual activity: No  Other Topics Concern  . Not on file  Social History Narrative   Married.   She is taking online classes trying to finish her Psychology degree   She had 3 children, one of her daughter's died 2017/07/20 from [place] and currently raised her 3 children ( two girls and one boy ) and also lost her niece May 2019 and is raising her teenage daughter.      Current Outpatient Medications:  .  acetaminophen (TYLENOL) 500 MG tablet, Take 1 tablet (500 mg total) by mouth every 8 (eight) hours as needed., Disp: 90 tablet, Rfl: 0 .  amLODipine-benazepril (LOTREL) 5-20 MG capsule, Take 1 capsule by mouth daily., Disp: 90 capsule, Rfl: 1 .  aspirin EC 81 MG tablet, Take 1 tablet (81 mg total) by mouth daily., Disp: 30 tablet, Rfl: 0 .  atenolol-chlorthalidone (TENORETIC) 50-25 MG tablet, Half to  1 tablet by mouth  daily, Disp: 90 tablet, Rfl: 1 .  atorvastatin (LIPITOR) 40 MG tablet, Take 1 tablet by mouth  daily at 6pm, Disp: 90 tablet, Rfl: 1 .  Phentermine-Topiramate 11.25-69 MG CP24, Take 1 capsule by mouth every morning., Disp: 30 capsule, Rfl: 2 .  traMADol (ULTRAM) 50 MG tablet, Take 1 tablet (50 mg total) by mouth every 8 (eight) hours as needed., Disp: 60 tablet, Rfl: 0 .  traZODone (DESYREL) 100 MG tablet, Take 1 tablet (100 mg total) by mouth at bedtime., Disp: 90 tablet, Rfl: 1 .  venlafaxine XR (EFFEXOR-XR) 75 MG 24 hr capsule, Take 1 capsule (75 mg total) by mouth daily with breakfast., Disp: 90 capsule, Rfl: 1  No Known Allergies   ROS  Constitutional: Negative for fever , positive for weight change.  Respiratory: Negative for cough and shortness of breath.   Cardiovascular: Negative for chest pain or palpitations.  Gastrointestinal:  Negative for abdominal pain, no bowel changes.  Musculoskeletal: Negative for gait problem or joint swelling.  Skin: Negative for rash.  Neurological: Negative for dizziness or headache.  No other specific complaints in a complete review of systems (except as listed in HPI above).  Objective  Vitals:   12/01/17 1443  BP: 118/70  Pulse: 65  Resp: 14  Temp: 98.3 F (36.8 C)  TempSrc: Oral  SpO2: 95%  Weight: (!) 319 lb 6.4 oz (144.9 kg)  Height: 5\' 7"  (1.702 m)  Body mass index is 50.03 kg/m.  Physical Exam  Constitutional: Patient appears well-developed and well-nourished. Obese No distress.  HEENT: head atraumatic, normocephalic, pupils equal and reactive to light,  neck supple, throat within normal limits Cardiovascular: Normal rate, regular rhythm and normal heart sounds.  No murmur heard. No BLE edema. Pulmonary/Chest: Effort normal and breath sounds normal. No respiratory distress. Abdominal: Soft.  There is no tenderness. Psychiatric: Patient has a normal mood and affect. behavior is normal. Judgment and thought content normal. Skin: large oval Hasty spot on left lower leg   PHQ2/9: Depression screen [facility] 2/9 12/01/2017 06/15/2017 06/15/2017 11/29/2016 02/18/2016  Decreased Interest 2 0 0 0 0  Down, Depressed, Hopeless 0 0 0 0 0  PHQ - 2 Score 2 0 0 0 0  Altered sleeping 1 1 - - -  Tired, decreased energy 1 1 - - -  Change in appetite 0 0 - - -  Feeling bad or failure about yourself  0 0 - - -  Trouble concentrating 0 0 - - -  Moving slowly or fidgety/restless 0 0 - - -  Suicidal thoughts 0 0 - - -  PHQ-9 Score 4 2 - - -  Difficult doing work/chores Somewhat difficult Not difficult at all - - -     Fall Risk: Fall Risk  12/01/2017 06/15/2017 11/29/2016 02/18/2016 12/08/2015  Falls in the past year? No No No No No     Functional Status Survey: Is the patient deaf or have difficulty hearing?: No Does the patient have difficulty seeing, even when wearing  glasses/contacts?: No Does the patient have difficulty concentrating, remembering, or making decisions?: No Does the patient have difficulty walking or climbing stairs?: No Does the patient have difficulty dressing or bathing?: No Does the patient have difficulty doing errands alone such as visiting a doctor's office or shopping?: No    Assessment & Plan  1. Benign essential HTN  - CBC with Differential/Platelet - Comprehensive metabolic panel - amLODipine-benazepril (LOTREL) 5-20 MG capsule; Take 1 capsule by mouth daily.  Dispense: 90 capsule; Refill: 1  2. Dyslipidemia  - Lipid panel - atorvastatin (LIPITOR) 40 MG tablet; Take 1 tablet by mouth  daily at 6pm  Dispense: 90 tablet; Refill: 1  3. Morbid obesity due to excess calories (HCC)  - Phentermine-Topiramate 11.25-69 MG CP24; Take 1 capsule by mouth every morning.  Dispense: 30 capsule; Refill: 2  4. Moderate episode of recurrent major depressive disorder (HCC)  - venlafaxine XR (EFFEXOR-XR) 75 MG 24 hr capsule; Take 1 capsule (75 mg total) by mouth daily with breakfast.  Dispense: 90 capsule; Refill: 1  5. Insomnia, persistent  - traZODone (DESYREL) 100 MG tablet; Take 1 tablet (100 mg total) by mouth at bedtime.  Dispense: 90 tablet; Refill: 1  6. Dysmetabolic syndrome  - Hemoglobin A1c  7. Arthralgia of both knees   8. Obstructive apnea  Continue CPAP  9. Vitamin D deficiency  - VITAMIN D 25 Hydroxy (Vit-D Deficiency, Fractures)  10. Need for Tdap vaccination  - Tdap vaccine greater than or equal to 7yo IM  11. Atypical mole  - Ambulatory referral to Dermatology

## 2018-01-31 ENCOUNTER — Encounter: Payer: 59 | Admitting: Family Medicine

## 2018-02-01 ENCOUNTER — Other Ambulatory Visit (HOSPITAL_COMMUNITY)
Admission: RE | Admit: 2018-02-01 | Discharge: 2018-02-01 | Disposition: A | Discharge status: Home / Self Care | Payer: 59 | Source: Ambulatory Visit | Attending: Family Medicine | Admitting: Family Medicine

## 2018-02-01 ENCOUNTER — Encounter: Payer: Self-pay | Admitting: Family Medicine

## 2018-02-01 ENCOUNTER — Ambulatory Visit (INDEPENDENT_AMBULATORY_CARE_PROVIDER_SITE_OTHER): Payer: 59 | Admitting: Family Medicine

## 2018-02-01 VITALS — BP 130/70 | HR 72 | Temp 98.2°F | Resp 16 | Ht 67.0 in | Wt 315.8 lb

## 2018-02-01 DIAGNOSIS — Z114 Encounter for screening for human immunodeficiency virus [HIV]: Secondary | ICD-10-CM | POA: Diagnosis not present

## 2018-02-01 DIAGNOSIS — E8881 Metabolic syndrome: Secondary | ICD-10-CM | POA: Diagnosis not present

## 2018-02-01 DIAGNOSIS — Z1231 Encounter for screening mammogram for malignant neoplasm of breast: Secondary | ICD-10-CM

## 2018-02-01 DIAGNOSIS — Z113 Encounter for screening for infections with a predominantly sexual mode of transmission: Secondary | ICD-10-CM

## 2018-02-01 DIAGNOSIS — Z0000 Encounter for general adult medical examination without abnormal findings: Secondary | ICD-10-CM

## 2018-02-01 DIAGNOSIS — N393 Stress incontinence (female) (male): Secondary | ICD-10-CM | POA: Diagnosis not present

## 2018-02-01 DIAGNOSIS — M21961 Unspecified acquired deformity of right lower leg: Secondary | ICD-10-CM

## 2018-02-01 NOTE — Patient Instructions (Signed)
Preventive Care 40-64 Years, Female Preventive care refers to lifestyle choices and visits with your health care provider that can promote health and wellness. What does preventive care include?  A yearly physical exam. This is also called an annual well check.  Dental exams once or twice a year.  Routine eye exams. Ask your health care provider how often you should have your eyes checked.  Personal lifestyle choices, including: ? Daily care of your teeth and gums. ? Regular physical activity. ? Eating a healthy diet. ? Avoiding tobacco and drug use. ? Limiting alcohol use. ? Practicing safe sex. ? Taking low-dose aspirin daily starting at age 60. ? Taking vitamin and mineral supplements as recommended by your health care provider. What happens during an annual well check? The services and screenings done by your health care provider during your annual well check will depend on your age, overall health, lifestyle risk factors, and family history of disease. Counseling Your health care provider may ask you questions about your:  Alcohol use.  Tobacco use.  Drug use.  Emotional well-being.  Home and relationship well-being.  Sexual activity.  Eating habits.  Work and work Statistician.  Method of birth control.  Menstrual cycle.  Pregnancy history.  Screening You may have the following tests or measurements:  Height, weight, and BMI.  Blood pressure.  Lipid and cholesterol levels. These may be checked every 5 years, or more frequently if you are over 60 years old.  Skin check.  Lung cancer screening. You may have this screening every year starting at age 60 if you have a 30-pack-year history of smoking and currently smoke or have quit within the past 15 years.  Fecal occult blood test (FOBT) of the stool. You may have this test every year starting at age 60.  Flexible sigmoidoscopy or colonoscopy. You may have a sigmoidoscopy every 5 years or a colonoscopy  every 10 years starting at age 60.  Hepatitis C blood test.  Hepatitis B blood test.  Sexually transmitted disease (STD) testing.  Diabetes screening. This is done by checking your blood sugar (glucose) after you have not eaten for a while (fasting). You may have this done every 1-3 years.  Mammogram. This may be done every 1-2 years. Talk to your health care provider about when you should start having regular mammograms. This may depend on whether you have a family history of breast cancer.  BRCA-related cancer screening. This may be done if you have a family history of breast, ovarian, tubal, or peritoneal cancers.  Pelvic exam and Pap test. This may be done every 3 years starting at age 60. Starting at age 60, this may be done every 5 years if you have a Pap test in combination with an HPV test.  Bone density scan. This is done to screen for osteoporosis. You may have this scan if you are at high risk for osteoporosis.  Discuss your test results, treatment options, and if necessary, the need for more tests with your health care provider. Vaccines Your health care provider may recommend certain vaccines, such as:  Influenza vaccine. This is recommended every year.  Tetanus, diphtheria, and acellular pertussis (Tdap, Td) vaccine. You may need a Td booster every 10 years.  Varicella vaccine. You may need this if you have not been vaccinated.  Zoster vaccine. You may need this after age 5.  Measles, mumps, and rubella (MMR) vaccine. You may need at least one dose of MMR if you were born in  1955 or later. You may also need a second dose.  Pneumococcal 13-valent conjugate (PCV13) vaccine. You may need this if you have certain conditions and were not previously vaccinated.  Pneumococcal polysaccharide (PPSV23) vaccine. You may need one or two doses if you smoke cigarettes or if you have certain conditions.  Meningococcal vaccine. You may need this if you have certain  conditions.  Hepatitis A vaccine. You may need this if you have certain conditions or if you travel or work in places where you may be exposed to hepatitis A.  Hepatitis B vaccine. You may need this if you have certain conditions or if you travel or work in places where you may be exposed to hepatitis B.  Haemophilus influenzae type b (Hib) vaccine. You may need this if you have certain conditions.  Talk to your health care provider about which screenings and vaccines you need and how often you need them. This information is not intended to replace advice given to you by your health care provider. Make sure you discuss any questions you have with your health care provider. Document Released: 05/01/2015 Document Revised: 12/23/2015 Document Reviewed: 02/03/2015 Elsevier Interactive Patient Education  2018 [employer]  

## 2018-02-01 NOTE — Progress Notes (Signed)
Name: Emma Oconnor   MRN: [id number]    DOB: [birth year]   Date:02/01/2018       Progress Note  Subjective  Chief Complaint  Chief Complaint  Patient presents with  . Annual Exam    HPI  Patient presents for annual CPE.  Diet: Protein (spinach and at least 2 fruits, flaxseed) shakes for breakfast; does drink coffee.  Lunch is usually leftovers or salmon packets.  Dinners - baked proteins; avoids starches or eats [place] rice/wheat bread/pastas. Exercise: Inconsistent exercise - swim, rides bike, and elliptical.   USPSTF grade A and B recommendations    Office Visit from 12/01/2017 in [facility]  AUDIT-C Score  0     Depression: Sleeping has improved lately.  Depression screen [facility] 2/9 02/01/2018 12/01/2017 06/15/2017 06/15/2017 11/29/2016  Decreased Interest 0 2 0 0 0  Down, Depressed, Hopeless 0 0 0 0 0  PHQ - 2 Score 0 2 0 0 0  Altered sleeping 1 1 1  - -  Tired, decreased energy 0 1 1 - -  Change in appetite 0 0 0 - -  Feeling bad or failure about yourself  0 0 0 - -  Trouble concentrating 0 0 0 - -  Moving slowly or fidgety/restless 0 0 0 - -  Suicidal thoughts 0 0 0 - -  PHQ-9 Score 1 4 2  - -  Difficult doing work/chores Not difficult at all Somewhat difficult Not difficult at all - -   Hypertension: BP Readings from Last 3 Encounters:  02/01/18 130/70  12/01/17 118/70  06/15/17 116/68   Obesity: Wt Readings from Last 3 Encounters:  02/01/18 (!) 315 lb 12.8 oz (143.2 kg)  12/01/17 (!) 319 lb 6.4 oz (144.9 kg)  06/15/17 (!) 323 lb 12.8 oz (146.9 kg)   BMI Readings from Last 3 Encounters:  02/01/18 49.46 kg/m  12/01/17 50.03 kg/m  06/15/17 50.71 kg/m    Hep C Screening: Normal in 2014. STD testing and prevention (HIV/chl/gon/syphilis): We will check today Intimate partner violence: No concerns Sexual History/Pain during Intercourse: No concners Menstrual History/LMP/Abnormal Bleeding: Postmenopausal for about 6-7 years. No vaginal  bleeding Incontinence Symptoms: Does have stress incontinence.   Advanced Care Planning: A voluntary discussion about advance care planning including the explanation and discussion of advance directives.  Discussed health care proxy and Living will, and the patient was able to identify a health care proxy as Nyemah Watton (Husband).  Patient does not have a living will at present time. If patient does have living will, I have requested they bring this to the clinic to be scanned in to their chart.  Breast cancer: Due mammogram [facility] Mammogram  Date Value Ref Range Status  03/25/2014 normal  Final    BRCA gene screening: Not indicated Cervical cancer screening: Not due today  Osteoporosis Screening: Declines No results found for: HMDEXASCAN  Lipids: We will check in fasting labs Lab Results  Component Value Date   CHOL 162 11/29/2016   CHOL 161 12/08/2015   CHOL 175 12/15/2014   Lab Results  Component Value Date   HDL 58 11/29/2016   HDL 64 12/08/2015   HDL 61 12/15/2014   Lab Results  Component Value Date   LDLCALC 89 11/29/2016   LDLCALC 81 12/08/2015   LDLCALC 95 12/15/2014   Lab Results  Component Value Date   TRIG 74 11/29/2016   TRIG 79 12/08/2015   TRIG 97 12/15/2014   Lab Results  Component Value Date  CHOLHDL 2.8 11/29/2016   CHOLHDL 2.5 12/08/2015   CHOLHDL 2.9 12/15/2014   No results found for: LDLDIRECT  Glucose: We will check in fasting labs Glucose  Date Value Ref Range Status  11/29/2016 94 65 - 99 mg/dL Final  12/08/2015 88 65 - 99 mg/dL Final  12/15/2014 97 65 - 99 mg/dL Final    Skin cancer: Has been referred to dermatology - has appointment next week Colorectal cancer: 2016; repeat in 5 years; not due this year; no changes in BM's. Lung cancer:  Low Dose CT Chest recommended if Age 27-80 years, 30 pack-year currently smoking OR have quit w/in 15years. Patient does not qualify.   ECG: Not indicated  Patient Active Problem List   Diagnosis  Date Noted  . Benign neoplasm of ascending colon   . Benign neoplasm of sigmoid colon   . Benign essential HTN 10/25/2014  . Bursitis of shoulder 10/25/2014  . Insomnia, persistent 10/25/2014  . Dyslipidemia 10/25/2014  . Female stress incontinence 10/25/2014  . Eczema intertrigo 10/25/2014  . Menopause 10/25/2014  . Dysmetabolic syndrome 52/84/1324  . Extreme obesity 10/25/2014  . Obstructive apnea 10/25/2014  . Vitamin D deficiency 10/25/2014  . Skin tag 10/25/2014  . Knee pain 10/25/2014    Past Surgical History:  Procedure Laterality Date  . COLONOSCOPY WITH PROPOFOL N/A 02/17/2015   Procedure: COLONOSCOPY WITH PROPOFOL;  Surgeon: Lucilla Lame, MD;  Location: [facility] ENDOSCOPY;  Service: Endoscopy;  Laterality: N/A;  . TUBAL LIGATION      Family History  Problem Relation Age of Onset  . Diabetes Mother   . Hypertension Mother   . Hyperlipidemia Mother   . Multiple sclerosis Brother   . Diabetes Brother     Social History   Socioeconomic History  . Marital status: Married    Spouse name: Lennette Bihari  . Number of children: 3  . Years of education: Not on file  . Highest education level: High school graduate  Occupational History  . Not on file  Social Needs  . Financial resource strain: Not hard at all  . Food insecurity:    Worry: Never true    Inability: Never true  . Transportation needs:    Medical: No    Non-medical: No  Tobacco Use  . Smoking status: Former Smoker    Packs/day: 0.50    Years: 30.00    Pack years: 15.00    Types: Cigarettes    Start date: 04/19/1975    Last attempt to quit: 12/17/2005    Years since quitting: 12.1  . Smokeless tobacco: Never Used  Substance and Sexual Activity  . Alcohol use: No    Alcohol/week: 0.0 standard drinks  . Drug use: No  . Sexual activity: Not Currently  Lifestyle  . Physical activity:    Days per week: 3 days    Minutes per session: 30 min  . Stress: Rather much  Relationships  . Social connections:     Talks on phone: Three times a week    Gets together: Never    Attends religious service: 1 to 4 times per year    Active member of club or organization: No    Attends meetings of clubs or organizations: Never    Relationship status: Married  . Intimate partner violence:    Fear of current or ex partner: No    Emotionally abused: No    Physically abused: No    Forced sexual activity: No  Other Topics Concern  . Not  on file  Social History Narrative   Married.   She is taking online classes trying to finish her Psychology degree   She had 3 children, one of her daughter's died August 06, 2017 from [place] and currently raised her 3 children ( two girls and one boy ) and also lost her niece May 2019 and is raising her teenage daughter.      Current Outpatient Medications:  .  acetaminophen (TYLENOL) 500 MG tablet, Take 1 tablet (500 mg total) by mouth every 8 (eight) hours as needed., Disp: 90 tablet, Rfl: 0 .  amLODipine-benazepril (LOTREL) 5-20 MG capsule, Take 1 capsule by mouth daily., Disp: 90 capsule, Rfl: 1 .  aspirin EC 81 MG tablet, Take 1 tablet (81 mg total) by mouth daily., Disp: 30 tablet, Rfl: 0 .  atorvastatin (LIPITOR) 40 MG tablet, Take 1 tablet by mouth  daily at 6pm, Disp: 90 tablet, Rfl: 1 .  hydrochlorothiazide (HYDRODIURIL) 12.5 MG tablet, Take 1 tablet (12.5 mg total) by mouth daily., Disp: 90 tablet, Rfl: 1 .  Phentermine-Topiramate 11.25-69 MG CP24, Take 1 capsule by mouth every morning., Disp: 30 capsule, Rfl: 2 .  traMADol (ULTRAM) 50 MG tablet, Take 1 tablet (50 mg total) by mouth every 8 (eight) hours as needed., Disp: 60 tablet, Rfl: 0 .  traZODone (DESYREL) 100 MG tablet, Take 1 tablet (100 mg total) by mouth at bedtime., Disp: 90 tablet, Rfl: 1 .  venlafaxine XR (EFFEXOR-XR) 75 MG 24 hr capsule, Take 1 capsule (75 mg total) by mouth daily with breakfast., Disp: 90 capsule, Rfl: 1  No Known Allergies   ROS  Constitutional: Negative for fever or weight change.   Respiratory: Negative for cough and shortness of breath.   Cardiovascular: Negative for chest pain or palpitations.  Gastrointestinal: Negative for abdominal pain, no bowel changes.  Musculoskeletal: Negative for gait problem or joint swelling.  Skin: Negative for rash. She is seeing dermatology next week for a spot on her leg. Neurological: Negative for dizziness or headache.  No other specific complaints in a complete review of systems (except as listed in HPI above).  Objective  Vitals:   02/01/18 1406  BP: 130/70  Pulse: 72  Resp: 16  Temp: 98.2 F (36.8 C)  TempSrc: Oral  SpO2: 96%  Weight: (!) 315 lb 12.8 oz (143.2 kg)  Height: 5' 7"  (1.702 m)    Body mass index is 49.46 kg/m.  Physical Exam  Constitutional: Patient appears well-developed and well-nourished. No distress.  HENT: Head: Normocephalic and atraumatic. Ears: B TMs ok, no erythema or effusion; Nose: Nose normal. Mouth/Throat: Oropharynx is clear and moist. No oropharyngeal exudate.  Eyes: Conjunctivae and EOM are normal. Pupils are equal, round, and reactive to light. No scleral icterus.  Neck: Normal range of motion. Neck supple. No JVD present. No thyromegaly present.  Cardiovascular: Normal rate, regular rhythm and normal heart sounds.  No murmur heard. No BLE edema. Pulmonary/Chest: Effort normal and breath sounds normal. No respiratory distress. Abdominal: Soft. Bowel sounds are normal, no distension. There is no tenderness. no masses Breast: no lumps or masses, no nipple discharge or rashes FEMALE GENITALIA: Deferred Musculoskeletal: Normal range of motion, no joint effusions. RIGHT foot has bony growth to the dorsal central aspect - will order Xray Neurological: he is alert and oriented to person, place, and time. No cranial nerve deficit. Coordination, balance, strength, speech and gait are normal.  Skin: Skin is warm and dry. No rash noted. No erythema.  Psychiatric: Patient  has a normal mood and  affect. behavior is normal. Judgment and thought content normal.   No results found for this or any previous visit (from the past 2160 hour(s)).  PHQ2/9: Depression screen [facility] 2/9 02/01/2018 12/01/2017 06/15/2017 06/15/2017 11/29/2016  Decreased Interest 0 2 0 0 0  Down, Depressed, Hopeless 0 0 0 0 0  PHQ - 2 Score 0 2 0 0 0  Altered sleeping 1 1 1  - -  Tired, decreased energy 0 1 1 - -  Change in appetite 0 0 0 - -  Feeling bad or failure about yourself  0 0 0 - -  Trouble concentrating 0 0 0 - -  Moving slowly or fidgety/restless 0 0 0 - -  Suicidal thoughts 0 0 0 - -  PHQ-9 Score 1 4 2  - -  Difficult doing work/chores Not difficult at all Somewhat difficult Not difficult at all - -    Fall Risk: Fall Risk  02/01/2018 12/01/2017 06/15/2017 11/29/2016 02/18/2016  Falls in the past year? No No No No No    Assessment & Plan  1. Well woman exam (no gynecological exam) -USPSTF grade A and B recommendations reviewed with patient; age-appropriate recommendations, preventive care, screening tests, etc discussed and encouraged; healthy living encouraged; see AVS for patient education given to patient -Discussed importance of 150 minutes of physical activity weekly, eat two servings of fish weekly, eat one serving of tree nuts ( cashews, pistachios, pecans, almonds.Marland Kitchen) every other day, eat 6 servings of fruit/vegetables daily and drink plenty of water and avoid sweet beverages.  - HIV Antibody (routine testing w rflx) - RPR - Cervicovaginal ancillary only - MM 3D SCREEN BREAST BILATERAL; Future  2. Dysmetabolic syndrome See above regarding health teaching.  She does have labs ordered from PCP that she needs to have done when she is fasting.  3. Encounter for screening for HIV - HIV Antibody (routine testing w rflx)  4. Female stress incontinence -Stable at this time, she declines medication or referral at this time.  5. Breast cancer screening by mammogram - MM 3D SCREEN BREAST  BILATERAL; Future  6. Routine screening for STI (sexually transmitted infection) - HIV Antibody (routine testing w rflx) - RPR - Cervicovaginal ancillary only  7. Acquired foot deformity, right -Noted on examination.  Area is nontender, we will obtain x-rays for further evaluation. - DG Foot Complete Right; Future

## 2018-02-02 LAB — CERVICOVAGINAL ANCILLARY ONLY
CHLAMYDIA, DNA PROBE: NEGATIVE
Neisseria Gonorrhea: NEGATIVE

## 2018-02-14 DIAGNOSIS — D2372 Other benign neoplasm of skin of left lower limb, including hip: Secondary | ICD-10-CM | POA: Diagnosis not present

## 2018-02-14 DIAGNOSIS — D485 Neoplasm of uncertain behavior of skin: Secondary | ICD-10-CM | POA: Diagnosis not present

## 2018-02-20 IMAGING — MG MM DIGITAL SCREENING BILAT W/ CAD
6 series · 6 of 6 positions shown · non-contrast
Comparison: Previous exam(s).

ACR Breast Density Category a: The breast tissue is almost entirely
fatty.

CLINICAL DATA: Screening.

EXAM:
DIGITAL SCREENING BILATERAL MAMMOGRAM WITH CAD

[L CC]
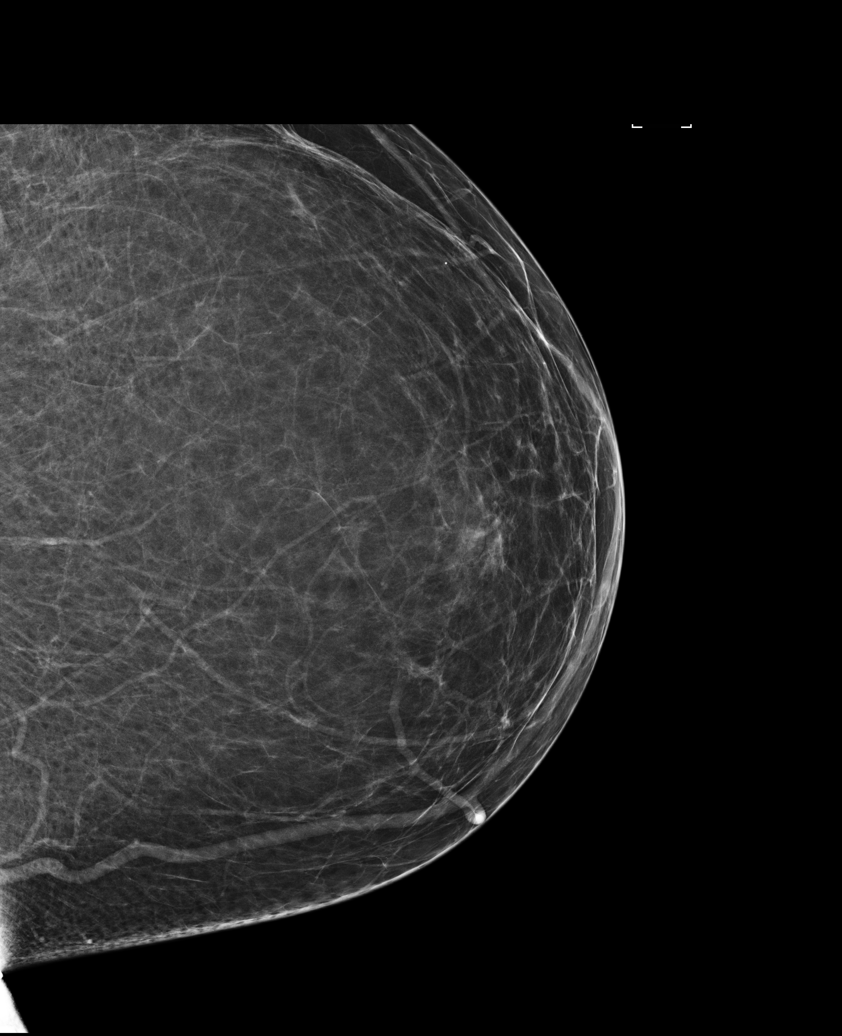

[R MLO]
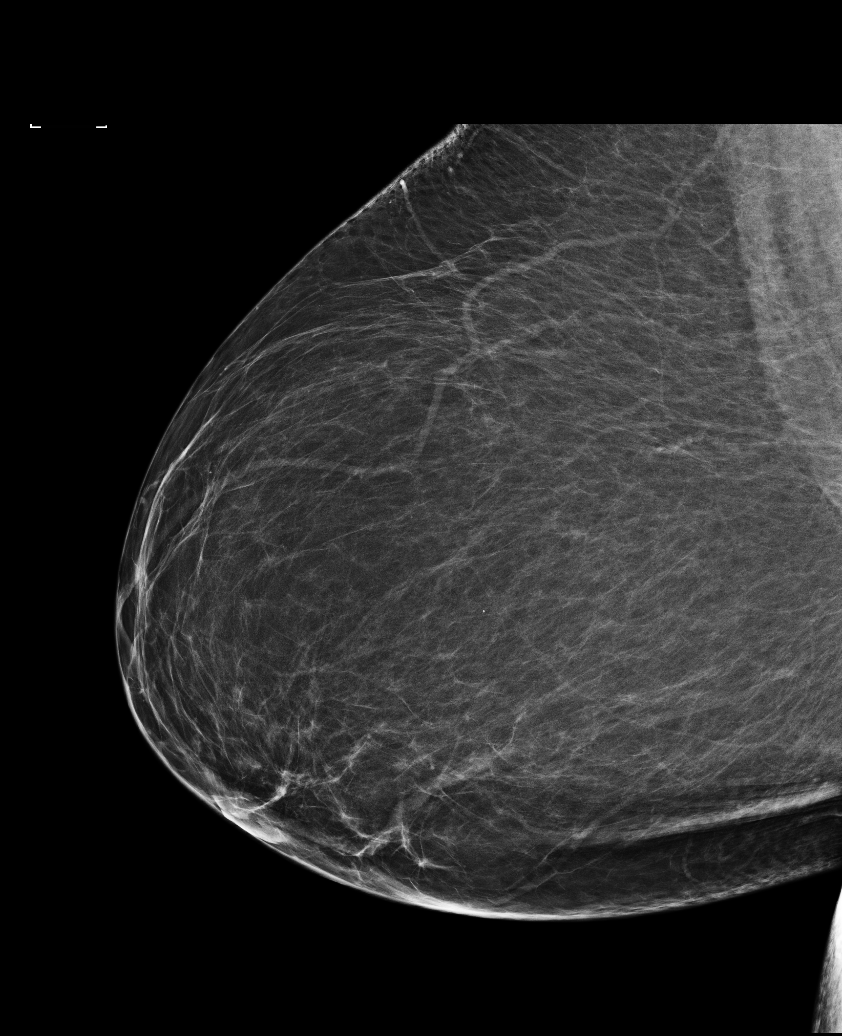

[R CC]
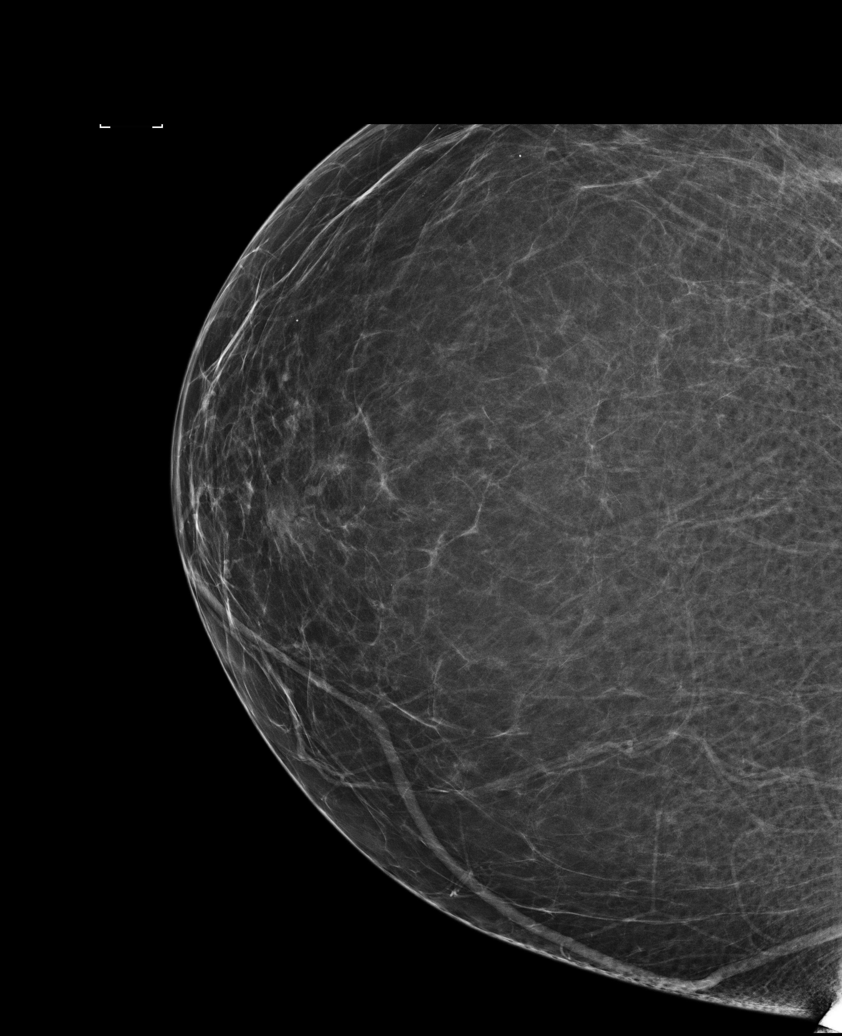

[R XCCL]
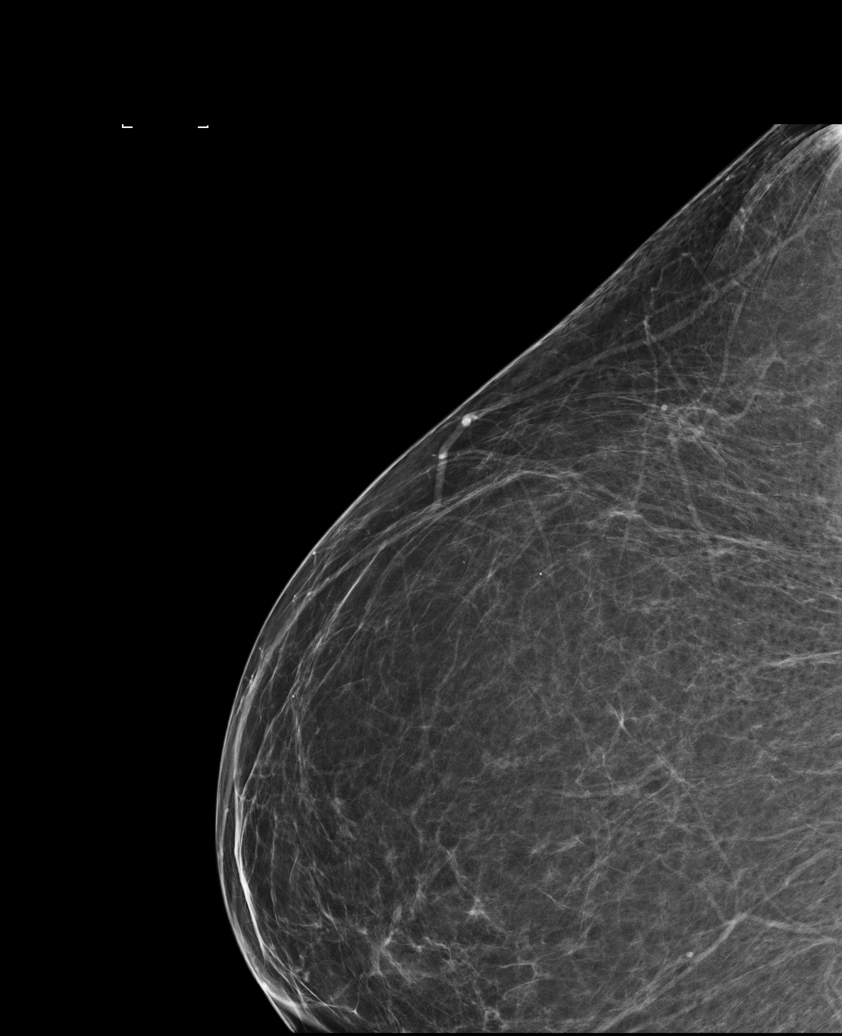

[L XCCL]
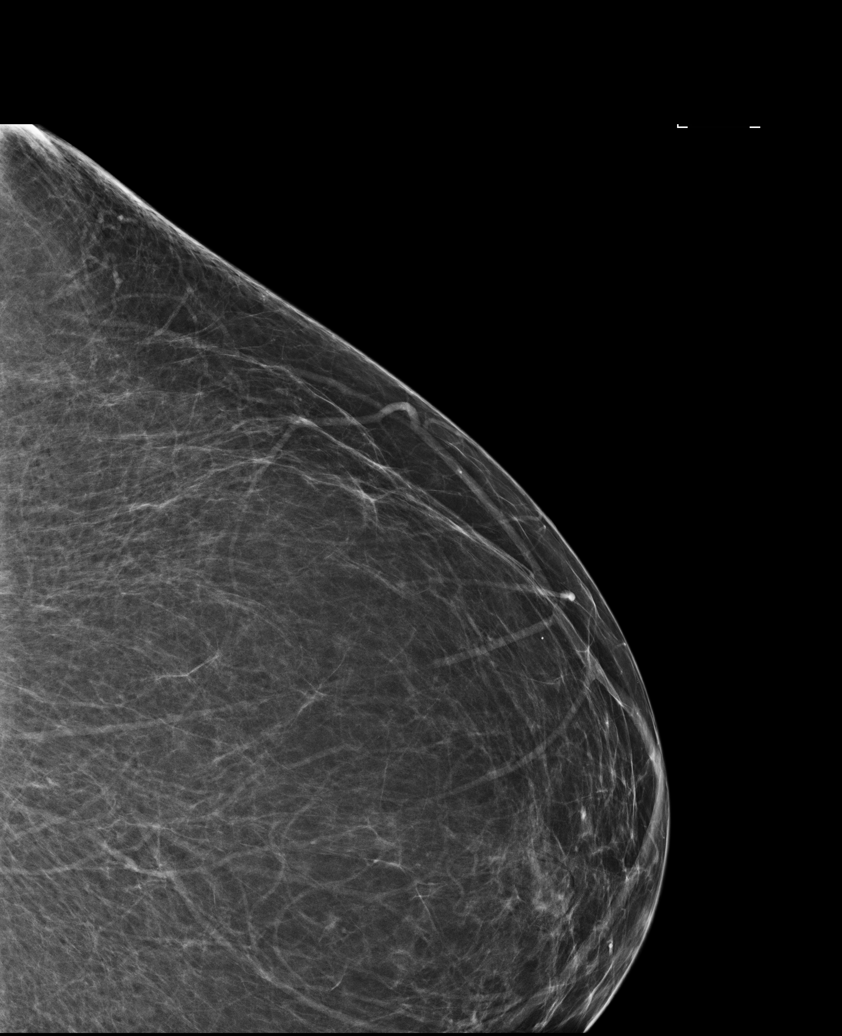

[L MLO]
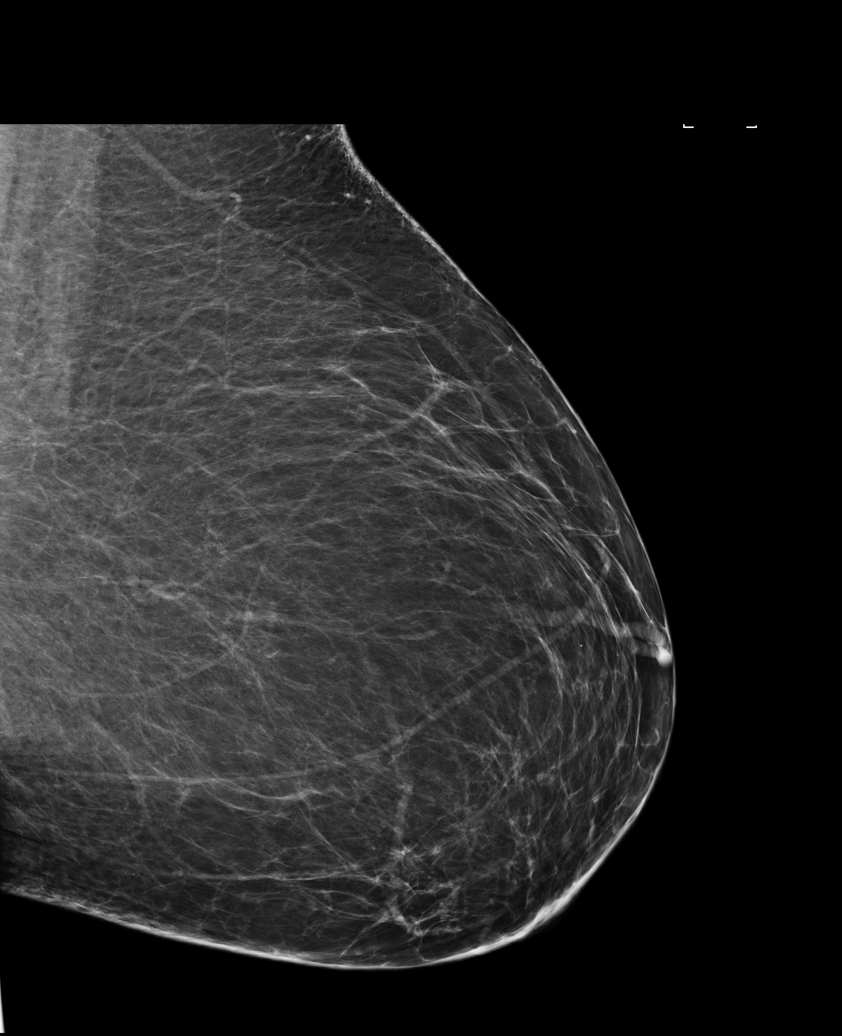

[6 of 6 positions shown; findings below may reference images not displayed]

FINDINGS: There are no findings suspicious for malignancy. Images were
processed with CAD.
IMPRESSION: No mammographic evidence of malignancy. A result letter of this
screening mammogram will be mailed directly to the patient.

RECOMMENDATION:
Screening mammogram in one year. (Code:[id number])

BI-RADS CATEGORY  1: Negative.

## 2018-03-14 DIAGNOSIS — E8881 Metabolic syndrome: Secondary | ICD-10-CM | POA: Diagnosis not present

## 2018-03-14 DIAGNOSIS — E785 Hyperlipidemia, unspecified: Secondary | ICD-10-CM | POA: Diagnosis not present

## 2018-03-14 DIAGNOSIS — Z0000 Encounter for general adult medical examination without abnormal findings: Secondary | ICD-10-CM | POA: Diagnosis not present

## 2018-03-14 DIAGNOSIS — I10 Essential (primary) hypertension: Secondary | ICD-10-CM | POA: Diagnosis not present

## 2018-03-15 LAB — LIPID PANEL
CHOLESTEROL TOTAL: 213 mg/dL — AB (ref 100–199)
Chol/HDL Ratio: 3.4 ratio (ref 0.0–4.4)
HDL: 63 mg/dL (ref >39)
LDL Calculated: 130 mg/dL — ABNORMAL HIGH (ref 0–99)
Triglycerides: 99 mg/dL (ref 0–149)
VLDL CHOLESTEROL CAL: 20 mg/dL (ref 5–40)

## 2018-03-15 LAB — COMPREHENSIVE METABOLIC PANEL
A/G RATIO: 1.5 (ref 1.2–2.2)
ALBUMIN: 4.1 g/dL (ref 3.6–4.8)
ALK PHOS: 121 IU/L — AB (ref 39–117)
ALT: 16 IU/L (ref 0–32)
AST: 17 IU/L (ref 0–40)
BILIRUBIN TOTAL: 0.3 mg/dL (ref 0.0–1.2)
BUN/Creatinine Ratio: 12 (ref 12–28)
BUN: 10 mg/dL (ref 8–27)
CHLORIDE: 103 mmol/L (ref 96–106)
CO2: 26 mmol/L (ref 20–29)
Calcium: 9.9 mg/dL (ref 8.7–10.3)
Creatinine, Ser: 0.81 mg/dL (ref 0.57–1.00)
GFR calc non Af Amer: 79 mL/min/{1.73_m2} (ref >59)
GFR, EST AFRICAN AMERICAN: 91 mL/min/{1.73_m2} (ref >59)
GLOBULIN, TOTAL: 2.8 g/dL (ref 1.5–4.5)
Glucose: 90 mg/dL (ref 65–99)
Potassium: 3.4 mmol/L — ABNORMAL LOW (ref 3.5–5.2)
SODIUM: 143 mmol/L (ref 134–144)
TOTAL PROTEIN: 6.9 g/dL (ref 6.0–8.5)

## 2018-03-15 LAB — CBC WITH DIFFERENTIAL/PLATELET
BASOS ABS: 0 10*3/uL (ref 0.0–0.2)
Basos: 1 %
EOS (ABSOLUTE): 0.1 10*3/uL (ref 0.0–0.4)
Eos: 1 %
Hematocrit: 41.7 % (ref 34.0–46.6)
Hemoglobin: 14.3 g/dL (ref 11.1–15.9)
IMMATURE GRANS (ABS): 0 10*3/uL (ref 0.0–0.1)
IMMATURE GRANULOCYTES: 0 %
LYMPHS: 27 %
Lymphocytes Absolute: 1.9 10*3/uL (ref 0.7–3.1)
MCH: 29.9 pg (ref 26.6–33.0)
MCHC: 34.3 g/dL (ref 31.5–35.7)
MCV: 87 fL (ref 79–97)
Monocytes Absolute: 0.6 10*3/uL (ref 0.1–0.9)
Monocytes: 8 %
NEUTROS PCT: 63 %
Neutrophils Absolute: 4.5 10*3/uL (ref 1.4–7.0)
PLATELETS: 194 10*3/uL (ref 150–450)
RBC: 4.78 x10E6/uL (ref 3.77–5.28)
RDW: 13.5 % (ref 12.3–15.4)
WBC: 7.1 10*3/uL (ref 3.4–10.8)

## 2018-03-15 LAB — RPR: RPR: NONREACTIVE

## 2018-03-15 LAB — VITAMIN D 25 HYDROXY (VIT D DEFICIENCY, FRACTURES): Vit D, 25-Hydroxy: 23.9 ng/mL — ABNORMAL LOW (ref 30.0–100.0)

## 2018-03-15 LAB — HEMOGLOBIN A1C
ESTIMATED AVERAGE GLUCOSE: 117 mg/dL
Hgb A1c MFr Bld: 5.7 % — ABNORMAL HIGH (ref 4.8–5.6)

## 2018-03-15 LAB — HIV ANTIBODY (ROUTINE TESTING W REFLEX): HIV SCREEN 4TH GENERATION: NONREACTIVE

## 2018-03-17 ENCOUNTER — Other Ambulatory Visit: Payer: Self-pay | Admitting: Family Medicine

## 2018-03-17 MED ORDER — VITAMIN D (ERGOCALCIFEROL) 1.25 MG (50000 UNIT) PO CAPS: 50000.0000 [IU] | ORAL_CAPSULE | ORAL | 0 refills | Status: DC

## 2018-04-30 ENCOUNTER — Ambulatory Visit
Admission: RE | Admit: 2018-04-30 | Discharge: 2018-04-30 | Disposition: A | Discharge status: Home / Self Care | Payer: 59 | Source: Ambulatory Visit | Attending: Family Medicine | Admitting: Family Medicine

## 2018-04-30 DIAGNOSIS — Z0000 Encounter for general adult medical examination without abnormal findings: Secondary | ICD-10-CM | POA: Diagnosis present

## 2018-04-30 DIAGNOSIS — Z1231 Encounter for screening mammogram for malignant neoplasm of breast: Secondary | ICD-10-CM | POA: Diagnosis not present

## 2018-05-08 ENCOUNTER — Ambulatory Visit: Payer: 59 | Admitting: Family Medicine

## 2018-06-19 ENCOUNTER — Ambulatory Visit: Payer: 59 | Admitting: Family Medicine

## 2018-06-19 ENCOUNTER — Encounter: Payer: Self-pay | Admitting: Family Medicine

## 2018-06-19 VITALS — BP 122/78 | HR 66 | Temp 98.3°F | Resp 16 | Ht 67.0 in | Wt 321.3 lb

## 2018-06-19 DIAGNOSIS — G4700 Insomnia, unspecified: Secondary | ICD-10-CM

## 2018-06-19 DIAGNOSIS — Z23 Encounter for immunization: Secondary | ICD-10-CM | POA: Diagnosis not present

## 2018-06-19 DIAGNOSIS — I10 Essential (primary) hypertension: Secondary | ICD-10-CM

## 2018-06-19 DIAGNOSIS — E785 Hyperlipidemia, unspecified: Secondary | ICD-10-CM | POA: Diagnosis not present

## 2018-06-19 DIAGNOSIS — E8881 Metabolic syndrome: Secondary | ICD-10-CM

## 2018-06-19 DIAGNOSIS — E559 Vitamin D deficiency, unspecified: Secondary | ICD-10-CM

## 2018-06-19 DIAGNOSIS — F331 Major depressive disorder, recurrent, moderate: Secondary | ICD-10-CM

## 2018-06-19 MED ORDER — ATORVASTATIN CALCIUM 40 MG PO TABS: ORAL_TABLET | ORAL | 1 refills | Status: DC

## 2018-06-19 MED ORDER — HYDROCHLOROTHIAZIDE 12.5 MG PO TABS: 12.5000 mg | ORAL_TABLET | Freq: Every day | ORAL | 1 refills | Status: DC

## 2018-06-19 MED ORDER — TRAZODONE HCL 100 MG PO TABS: 100.0000 mg | ORAL_TABLET | Freq: Every day | ORAL | 1 refills | Status: DC

## 2018-06-19 MED ORDER — AMLODIPINE BESY-BENAZEPRIL HCL 5-20 MG PO CAPS: 1.0000 | ORAL_CAPSULE | Freq: Every day | ORAL | 1 refills | Status: DC

## 2018-06-19 MED ORDER — VITAMIN D (ERGOCALCIFEROL) 1.25 MG (50000 UNIT) PO CAPS: 50000.0000 [IU] | ORAL_CAPSULE | ORAL | 0 refills | Status: DC

## 2018-06-19 NOTE — Progress Notes (Signed)
Name: Emma Oconnor   MRN: [id number]    DOB: 1956-03-04   Date:06/19/2018       Progress Note  Subjective  Chief Complaint  Chief Complaint  Patient presents with  . Medication Refill    6 month F/U  . Hypertension    Denies any symptoms  . Hyperlipidemia  . Insomnia    4 to 5 hours nightly, states her bilateral knees and hips will bother her and states she will have to sleep on her back  . Obesity    Has gained 5 pounds since last visit  . Sleep Apnea  . Depression  . Prediabetes    HPI   HTN: taking bpmedicationdaily and denies side effectsBP is at goal today   Pre-diabetes: hgbA1C was 5.7 %, she denies polydipsia, polyuria or polyphagia. Weight has gone up, but states more compliant with diet lately   Hyperlipidemia: taking Atorvastatin, last LDL was elevated, she states has been compliant lately, we will recheck in the Fall again   Insomnia: Trazodone helps her fallasleep, takes it every night, able to fall and stay asleep except when she has hip pain, but she does not want pain medication   Obesity: she was 352.6 lbs with Qsymia over time she went down 315 lbs. She decided to stop medication because she was taking too many medications and she stopped all of it. She has gained weight since last visit she has gained 5 lbs   OSA: wears CPAP every night now. She has been sleeping better. Unchanged   Depression Major: she has a long history of depression got worse when she lost her daughter at  87 yo from a MVA 07/18/2017  and her niece a couple of months and is raising her daughter's 3 children and also her nieces daughter. She states holidays were difficulty but feeling better now.  She stopped Effexor on her own a few months and she states she feels more in control of her emotions  Patient Active Problem List   Diagnosis Date Noted  . Benign neoplasm of ascending colon   . Benign neoplasm of sigmoid colon   . Benign essential HTN 10/25/2014  . Bursitis of  shoulder 10/25/2014  . Insomnia, persistent 10/25/2014  . Dyslipidemia 10/25/2014  . Female stress incontinence 10/25/2014  . Eczema intertrigo 10/25/2014  . Menopause 10/25/2014  . Dysmetabolic syndrome 93/81/8299  . Extreme obesity 10/25/2014  . Obstructive apnea 10/25/2014  . Vitamin D deficiency 10/25/2014  . Skin tag 10/25/2014  . Knee pain 10/25/2014    Past Surgical History:  Procedure Laterality Date  . COLONOSCOPY WITH PROPOFOL N/A 02/17/2015   Procedure: COLONOSCOPY WITH PROPOFOL;  Surgeon: Lucilla Lame, MD;  Location: [facility] ENDOSCOPY;  Service: Endoscopy;  Laterality: N/A;  . TUBAL LIGATION      Family History  Problem Relation Age of Onset  . Diabetes Mother   . Hypertension Mother   . Hyperlipidemia Mother   . Multiple sclerosis Brother        Jul 19, 2010 passed away  . Diabetes Brother     Social History   Socioeconomic History  . Marital status: Married    Spouse name: Lennette Bihari  . Number of children: 3  . Years of education: Not on file  . Highest education level: High school graduate  Occupational History  . Not on file  Social Needs  . Financial resource strain: Not hard at all  . Food insecurity:    Worry: Never true  Inability: Never true  . Transportation needs:    Medical: No    Non-medical: No  Tobacco Use  . Smoking status: Former Smoker    Packs/day: 0.50    Years: 30.00    Pack years: 15.00    Types: Cigarettes    Start date: 04/19/1975    Last attempt to quit: 12/17/2005    Years since quitting: 12.5  . Smokeless tobacco: Never Used  Substance and Sexual Activity  . Alcohol use: No    Alcohol/week: 0.0 standard drinks  . Drug use: No  . Sexual activity: Not Currently    Partners: Male  Lifestyle  . Physical activity:    Days per week: 3 days    Minutes per session: 30 min  . Stress: Rather much  Relationships  . Social connections:    Talks on phone: Three times a week    Gets together: Never    Attends religious service: 1 to 4  times per year    Active member of club or organization: No    Attends meetings of clubs or organizations: Never    Relationship status: Married  . Intimate partner violence:    Fear of current or ex partner: No    Emotionally abused: No    Physically abused: No    Forced sexual activity: No  Other Topics Concern  . Not on file  Social History Narrative   Married.   She is taking online classes trying to finish her Psychology degree   She had 3 children, one of her daughter's died Jul 18, 2017 from [place] and currently raised her 3 children ( two girls and one boy ) and also lost her niece May 2019 and is raising her teenage daughter.      Current Outpatient Medications:  .  acetaminophen (TYLENOL) 500 MG tablet, Take 1 tablet (500 mg total) by mouth every 8 (eight) hours as needed., Disp: 90 tablet, Rfl: 0 .  amLODipine-benazepril (LOTREL) 5-20 MG capsule, Take 1 capsule by mouth daily., Disp: 90 capsule, Rfl: 1 .  atorvastatin (LIPITOR) 40 MG tablet, Take 1 tablet by mouth  daily at 6pm, Disp: 90 tablet, Rfl: 1 .  hydrochlorothiazide (HYDRODIURIL) 12.5 MG tablet, Take 1 tablet (12.5 mg total) by mouth daily., Disp: 90 tablet, Rfl: 1 .  traZODone (DESYREL) 100 MG tablet, Take 1 tablet (100 mg total) by mouth at bedtime., Disp: 90 tablet, Rfl: 1 .  aspirin EC 81 MG tablet, Take 1 tablet (81 mg total) by mouth daily. (Patient not taking: Reported on 06/19/2018), Disp: 30 tablet, Rfl: 0 .  Phentermine-Topiramate 11.25-69 MG CP24, Take 1 capsule by mouth every morning. (Patient not taking: Reported on 06/19/2018), Disp: 30 capsule, Rfl: 2 .  traMADol (ULTRAM) 50 MG tablet, Take 1 tablet (50 mg total) by mouth every 8 (eight) hours as needed. (Patient not taking: Reported on 06/19/2018), Disp: 60 tablet, Rfl: 0 .  venlafaxine XR (EFFEXOR-XR) 75 MG 24 hr capsule, Take 1 capsule (75 mg total) by mouth daily with breakfast. (Patient not taking: Reported on 06/19/2018), Disp: 90 capsule, Rfl: 1 .  Vitamin D,  Ergocalciferol, (DRISDOL) 1.25 MG (50000 UT) CAPS capsule, Take 1 capsule (50,000 Units total) by mouth every 7 (seven) days. (Patient not taking: Reported on 06/19/2018), Disp: 12 capsule, Rfl: 0  No Known Allergies  I personally reviewed active problem list, medication list, allergies, family history, social history with the patient/caregiver today.   ROS  Constitutional: Negative for fever or weight change.  Respiratory: Negative for cough and shortness of breath.   Cardiovascular: Negative for chest pain or palpitations.  Gastrointestinal: Negative for abdominal pain, no bowel changes.  Musculoskeletal: Negative for gait problem or joint swelling.  Skin: Negative for rash.  Neurological: Negative for dizziness or headache.  No other specific complaints in a complete review of systems (except as listed in HPI above).  Objective  Vitals:   06/19/18 1531  BP: 122/78  Pulse: 66  Resp: 16  Temp: 98.3 F (36.8 C)  TempSrc: Oral  SpO2: 98%  Weight: (!) 321 lb 4.8 oz (145.7 kg)  Height: 5\' 7"  (1.702 m)    Body mass index is 50.32 kg/m.  Physical Exam  Constitutional: Patient appears well-developed and well-nourished. Obese  No distress.  HEENT: head atraumatic, normocephalic, pupils equal and reactive to light,neck supple, throat within normal limits Cardiovascular: Normal rate, regular rhythm and normal heart sounds.  No murmur heard. No BLE edema. Pulmonary/Chest: Effort normal and breath sounds normal. No respiratory distress. Abdominal: Soft.  There is no tenderness. Psychiatric: Patient has a normal mood and affect. behavior is normal. Judgment and thought content normal.  PHQ2/9: Depression screen [facility] 2/9 06/19/2018 02/01/2018 12/01/2017 06/15/2017 06/15/2017  Decreased Interest 0 0 2 0 0  Down, Depressed, Hopeless 0 0 0 0 0  PHQ - 2 Score 0 0 2 0 0  Altered sleeping 1 1 1 1  -  Tired, decreased energy 1 0 1 1 -  Change in appetite 0 0 0 0 -  Feeling bad or failure  about yourself  0 0 0 0 -  Trouble concentrating 0 0 0 0 -  Moving slowly or fidgety/restless 0 0 0 0 -  Suicidal thoughts 0 0 0 0 -  PHQ-9 Score 2 1 4 2  -  Difficult doing work/chores Not difficult at all Not difficult at all Somewhat difficult Not difficult at all -     Fall Risk: Fall Risk  06/19/2018 02/01/2018 12/01/2017 06/15/2017 11/29/2016  Falls in the past year? 0 No No No No     Functional Status Survey: Is the patient deaf or have difficulty hearing?: No Does the patient have difficulty seeing, even when wearing glasses/contacts?: No Does the patient have difficulty concentrating, remembering, or making decisions?: No Does the patient have difficulty walking or climbing stairs?: No Does the patient have difficulty dressing or bathing?: No Does the patient have difficulty doing errands alone such as visiting a doctor's office or shopping?: No    Assessment & Plan   1. Benign essential HTN  - amLODipine-benazepril (LOTREL) 5-20 MG capsule; Take 1 capsule by mouth daily.  Dispense: 90 capsule; Refill: 1 - hydrochlorothiazide (HYDRODIURIL) 12.5 MG tablet; Take 1 tablet (12.5 mg total) by mouth daily.  Dispense: 90 tablet; Refill: 1  2. Needs flu shot  - Flu Vaccine QUAD 6+ mos PF IM (Fluarix Quad PF)  3. Dyslipidemia  - atorvastatin (LIPITOR) 40 MG tablet; Take 1 tablet by mouth  daily at 6pm  Dispense: 90 tablet; Refill: 1  4. Insomnia, persistent  - traZODone (DESYREL) 100 MG tablet; Take 1 tablet (100 mg total) by mouth at bedtime.  Dispense: 90 tablet; Refill: 1  5. Moderate episode of recurrent major depressive disorder (HCC)  Refuses medication   6. Dysmetabolic syndrome   7. Vitamin D deficiency  - Vitamin D, Ergocalciferol, (DRISDOL) 1.25 MG (50000 UT) CAPS capsule; Take 1 capsule (50,000 Units total) by mouth every 7 (seven) days.  Dispense: 12 capsule; Refill:  0  8. Morbid obesity due to excess calories [facility])  Discussed with the patient the risk  posed by an increased BMI. Discussed importance of portion control, calorie counting and at least 150 minutes of physical activity weekly. Avoid sweet beverages and drink more water. Eat at least 6 servings of fruit and vegetables daily

## 2018-06-20 ENCOUNTER — Telehealth: Payer: Self-pay

## 2018-06-20 NOTE — Telephone Encounter (Signed)
No updates

## 2018-06-20 NOTE — Telephone Encounter (Signed)
Copied from [place] [id number]. Topic: General - Inquiry >> Jun 20, 2018 12:37 PM Ahmed Prima L wrote: Reason for CRM: patient was in the office yesterday and would like to know if anyone found a clear dunkin doughnut cup, pink lid about 20 ounces.

## 2018-06-20 NOTE — Telephone Encounter (Signed)
Never seen this cup and Melissa told her yesterday the same thing. Any updates Melissa?

## 2018-08-08 ENCOUNTER — Ambulatory Visit (INDEPENDENT_AMBULATORY_CARE_PROVIDER_SITE_OTHER): Payer: 59 | Admitting: Family Medicine

## 2018-08-08 ENCOUNTER — Encounter: Payer: Self-pay | Admitting: Family Medicine

## 2018-08-08 DIAGNOSIS — N3090 Cystitis, unspecified without hematuria: Secondary | ICD-10-CM

## 2018-08-08 MED ORDER — NITROFURANTOIN MONOHYD MACRO 100 MG PO CAPS: 100.0000 mg | ORAL_CAPSULE | Freq: Two times a day (BID) | ORAL | 0 refills | Status: DC

## 2018-08-08 NOTE — Progress Notes (Signed)
Name: Emma Oconnor   MRN: [id number]    DOB: [birth year]   Date:08/08/2018       Progress Note  Subjective  Chief Complaint  Chief Complaint  Patient presents with  . Urinary Tract Infection    sx: burning upon urination, frequency. started last week or so. no meds taken    I connected with  [name]  on 08/08/18 at  2:20 PM EDT by a video enabled telemedicine application and verified that I am speaking with the correct person using two identifiers.  I discussed the limitations of evaluation and management by telemedicine and the availability of in person appointments. The patient expressed understanding and agreed to proceed. Staff also discussed with the patient that there may be a patient responsible charge related to this service. Patient Location: at home  Provider Location: [facility]   HPI  Dysuria: she states symptoms started one week ago. She states started after she was riding her bike and noticed symptoms afterwards. She has supra pubic area, cramping like, she also has urgency, also nocturia. No back pain or fever, hesitancy or  hematuria.    Patient Active Problem List   Diagnosis Date Noted  . Benign neoplasm of ascending colon   . Benign neoplasm of sigmoid colon   . Benign essential HTN 10/25/2014  . Bursitis of shoulder 10/25/2014  . Insomnia, persistent 10/25/2014  . Dyslipidemia 10/25/2014  . Female stress incontinence 10/25/2014  . Eczema intertrigo 10/25/2014  . Menopause 10/25/2014  . Dysmetabolic syndrome 02/58/5277  . Extreme obesity 10/25/2014  . Obstructive apnea 10/25/2014  . Vitamin D deficiency 10/25/2014  . Skin tag 10/25/2014  . Knee pain 10/25/2014    Past Surgical History:  Procedure Laterality Date  . COLONOSCOPY WITH PROPOFOL N/A 02/17/2015   Procedure: COLONOSCOPY WITH PROPOFOL;  Surgeon: Lucilla Lame, MD;  Location: [facility] ENDOSCOPY;  Service: Endoscopy;  Laterality: N/A;  . TUBAL LIGATION      Family History   Problem Relation Age of Onset  . Diabetes Mother   . Hypertension Mother   . Hyperlipidemia Mother   . Multiple sclerosis Brother        08/06/2010 passed away  . Diabetes Brother     Social History   Socioeconomic History  . Marital status: Married    Spouse name: Lennette Bihari  . Number of children: 3  . Years of education: Not on file  . Highest education level: High school graduate  Occupational History  . Not on file  Social Needs  . Financial resource strain: Not hard at all  . Food insecurity:    Worry: Never true    Inability: Never true  . Transportation needs:    Medical: No    Non-medical: No  Tobacco Use  . Smoking status: Former Smoker    Packs/day: 0.50    Years: 30.00    Pack years: 15.00    Types: Cigarettes    Start date: 04/19/1975    Last attempt to quit: 12/17/2005    Years since quitting: 12.6  . Smokeless tobacco: Never Used  Substance and Sexual Activity  . Alcohol use: No    Alcohol/week: 0.0 standard drinks  . Drug use: No  . Sexual activity: Not Currently    Partners: Male  Lifestyle  . Physical activity:    Days per week: 3 days    Minutes per session: 30 min  . Stress: Rather much  Relationships  . Social connections:  Talks on phone: Three times a week    Gets together: Never    Attends religious service: 1 to 4 times per year    Active member of club or organization: No    Attends meetings of clubs or organizations: Never    Relationship status: Married  . Intimate partner violence:    Fear of current or ex partner: No    Emotionally abused: No    Physically abused: No    Forced sexual activity: No  Other Topics Concern  . Not on file  Social History Narrative   Married.   She is taking online classes trying to finish her Psychology degree   She had 3 children, one of her daughter's died 08/03/17 from [place] and currently raised her 3 children ( two girls and one boy ) and also lost her niece May 2019 and is raising her teenage daughter.       Current Outpatient Medications:  .  acetaminophen (TYLENOL) 500 MG tablet, Take 1 tablet (500 mg total) by mouth every 8 (eight) hours as needed., Disp: 90 tablet, Rfl: 0 .  amLODipine-benazepril (LOTREL) 5-20 MG capsule, Take 1 capsule by mouth daily., Disp: 90 capsule, Rfl: 1 .  atorvastatin (LIPITOR) 40 MG tablet, Take 1 tablet by mouth  daily at 6pm, Disp: 90 tablet, Rfl: 1 .  hydrochlorothiazide (HYDRODIURIL) 12.5 MG tablet, Take 1 tablet (12.5 mg total) by mouth daily., Disp: 90 tablet, Rfl: 1 .  traZODone (DESYREL) 100 MG tablet, Take 1 tablet (100 mg total) by mouth at bedtime., Disp: 90 tablet, Rfl: 1 .  Vitamin D, Ergocalciferol, (DRISDOL) 1.25 MG (50000 UT) CAPS capsule, Take 1 capsule (50,000 Units total) by mouth every 7 (seven) days., Disp: 12 capsule, Rfl: 0 .  aspirin EC 81 MG tablet, Take 1 tablet (81 mg total) by mouth daily. (Patient not taking: Reported on 06/19/2018), Disp: 30 tablet, Rfl: 0  No Known Allergies  I personally reviewed active problem list, medication list, allergies, family history, social history with the patient/caregiver today.   ROS  Ten systems reviewed and is negative except as mentioned in HPI   Objective  Virtual encounter, vitals not obtained.  Physical Exam  Awake, alert and oriented  PHQ2/9: Depression screen [facility] 2/9 08/08/2018 06/19/2018 02/01/2018 12/01/2017 06/15/2017  Decreased Interest 0 0 0 2 0  Down, Depressed, Hopeless 0 0 0 0 0  PHQ - 2 Score 0 0 0 2 0  Altered sleeping 0 1 1 1 1   Tired, decreased energy 0 1 0 1 1  Change in appetite 0 0 0 0 0  Feeling bad or failure about yourself  0 0 0 0 0  Trouble concentrating 0 0 0 0 0  Moving slowly or fidgety/restless 0 0 0 0 0  Suicidal thoughts 0 0 0 0 0  PHQ-9 Score 0 2 1 4 2   Difficult doing work/chores Not difficult at all Not difficult at all Not difficult at all Somewhat difficult Not difficult at all   PHQ-2/9 Result is negative.    Fall Risk: Fall Risk  08/08/2018  06/19/2018 02/01/2018 12/01/2017 06/15/2017  Falls in the past year? 0 0 No No No  Number falls in past yr: 0 - - - -  Injury with Fall? 0 - - - -    Assessment & Plan  1. Cystitis  - nitrofurantoin, macrocrystal-monohydrate, (MACROBID) 100 MG capsule; Take 1 capsule (100 mg total) by mouth 2 (two) times daily.  Dispense: 10 capsule;  Refill: 0  I discussed the assessment and treatment plan with the patient. The patient was provided an opportunity to ask questions and all were answered. The patient agreed with the plan and demonstrated an understanding of the instructions.  The patient was advised to call back or seek an in-person evaluation if the symptoms worsen or if the condition fails to improve as anticipated.  I provided 15 minutes of non-face-to-face time during this encounter.

## 2018-10-01 ENCOUNTER — Other Ambulatory Visit: Payer: Self-pay | Admitting: Family Medicine

## 2018-10-01 DIAGNOSIS — I10 Essential (primary) hypertension: Secondary | ICD-10-CM

## 2018-10-03 ENCOUNTER — Telehealth: Payer: Self-pay | Admitting: Family Medicine

## 2018-10-03 NOTE — Telephone Encounter (Signed)
Returned call and explained patient medication Atenolol was denied because her therapy was changed to HCTZ and Amlodipine-Benazepril.

## 2018-10-03 NOTE — Telephone Encounter (Signed)
Pt requesting return call. She does not understand why her atenolol prescription was denied said she has been on this medication for years.

## 2018-11-19 ENCOUNTER — Other Ambulatory Visit: Payer: Self-pay

## 2018-11-19 ENCOUNTER — Ambulatory Visit (INDEPENDENT_AMBULATORY_CARE_PROVIDER_SITE_OTHER): Payer: 59 | Admitting: Family Medicine

## 2018-11-19 ENCOUNTER — Encounter: Payer: Self-pay | Admitting: Family Medicine

## 2018-11-19 DIAGNOSIS — E559 Vitamin D deficiency, unspecified: Secondary | ICD-10-CM | POA: Diagnosis not present

## 2018-11-19 DIAGNOSIS — I10 Essential (primary) hypertension: Secondary | ICD-10-CM

## 2018-11-19 DIAGNOSIS — G4700 Insomnia, unspecified: Secondary | ICD-10-CM | POA: Diagnosis not present

## 2018-11-19 DIAGNOSIS — E785 Hyperlipidemia, unspecified: Secondary | ICD-10-CM | POA: Diagnosis not present

## 2018-11-19 DIAGNOSIS — G4733 Obstructive sleep apnea (adult) (pediatric): Secondary | ICD-10-CM

## 2018-11-19 MED ORDER — VITAMIN D (CHOLECALCIFEROL) 25 MCG (1000 UT) PO TABS: 1.0000 | ORAL_TABLET | Freq: Every day | ORAL | 0 refills | Status: AC

## 2018-11-19 MED ORDER — ATORVASTATIN CALCIUM 40 MG PO TABS: ORAL_TABLET | ORAL | 1 refills | Status: DC

## 2018-11-19 MED ORDER — AMLODIPINE BESY-BENAZEPRIL HCL 5-20 MG PO CAPS: 1.0000 | ORAL_CAPSULE | Freq: Every day | ORAL | 1 refills | Status: DC

## 2018-11-19 MED ORDER — TRAZODONE HCL 100 MG PO TABS: 100.0000 mg | ORAL_TABLET | Freq: Every day | ORAL | 1 refills | Status: DC

## 2018-11-19 MED ORDER — CONTRAVE 8-90 MG PO TB12: 2.0000 | ORAL_TABLET | Freq: Two times a day (BID) | ORAL | 1 refills | Status: DC | PRN

## 2018-11-19 MED ORDER — HYDROCHLOROTHIAZIDE 12.5 MG PO TABS: 12.5000 mg | ORAL_TABLET | Freq: Every day | ORAL | 1 refills | Status: DC

## 2018-11-19 NOTE — Progress Notes (Signed)
Name: Emma Oconnor   MRN: [id number]    DOB: [birth year]   Date:11/19/2018       Progress Note  Subjective  Chief Complaint  Chief Complaint  Patient presents with  . Medication Refill  . Hypertension    Denies any symptoms  . Hyperlipidemia  . Insomnia    Unchanged-sleeping around 5 to 6 hours nightly  . Obesity  . Depression  . Prediabetes  . Sleep Apnea    Wears it nightly    I connected with  [name]  on 11/19/18 at  3:40 PM EDT by a video enabled telemedicine application and verified that I am speaking with the correct person using two identifiers.  I discussed the limitations of evaluation and management by telemedicine and the availability of in person appointments. The patient expressed understanding and agreed to proceed. Staff also discussed with the patient that there may be a patient responsible charge related to this service. Patient Location: at home  Provider Location: [facility]   HPI  HTN: taking bpmedicationdaily and denies side effects, no dizziness, headaches or chest pain   Pre-diabetes: hgbA1C was 5.7%, she denies polydipsia, polyuria or polyphagia. Weight has gone up again, 12 pounds higher,  She states not paying attention to her diet again  Hyperlipidemia: taking Atorvastatin, last LDL was elevated, she states has been compliant lately, we will recheck in the Fall again during her CPE   Insomnia: Trazodone helps her fallasleep, takes it every night, able to fall and stay asleep except when she has hip pain, but she does not want pain medication   Obesity: she was 352.6 lbs with Qsymia over time she went down 315 lbs. She decided to stop medication because she was taking too many medications. She has gained weight since last visit she has gained another 12 lbs since last visit . She states she is a stress eater. She is worried about her sister , still grieving the loss of her daughter. She would like to try something  different   OSA: wears CPAP every night now.She has been sleeping better.Unchanged   Depression Major:she has a long history of depression got worse when she lost her daughter at  46 yo from a MVA March 2019  and her niece a couple of months and is raising her daughter's 3 children and also her nieces daughter. She states holidays were difficulty but feeling better now. She stopped Effexor on her own months ago and does not want to go back on medication    Patient Active Problem List   Diagnosis Date Noted  . Benign neoplasm of ascending colon   . Benign neoplasm of sigmoid colon   . Benign essential HTN 10/25/2014  . Bursitis of shoulder 10/25/2014  . Insomnia, persistent 10/25/2014  . Dyslipidemia 10/25/2014  . Female stress incontinence 10/25/2014  . Eczema intertrigo 10/25/2014  . Menopause 10/25/2014  . Dysmetabolic syndrome 89/37/3428  . Extreme obesity 10/25/2014  . Obstructive apnea 10/25/2014  . Vitamin D deficiency 10/25/2014  . Skin tag 10/25/2014  . Knee pain 10/25/2014    Past Surgical History:  Procedure Laterality Date  . COLONOSCOPY WITH PROPOFOL N/A 02/17/2015   Procedure: COLONOSCOPY WITH PROPOFOL;  Surgeon: Lucilla Lame, MD;  Location: [facility] ENDOSCOPY;  Service: Endoscopy;  Laterality: N/A;  . TUBAL LIGATION      Family History  Problem Relation Age of Onset  . Diabetes Mother   . Hypertension Mother   . Hyperlipidemia Mother   .  Multiple sclerosis Brother        07/13/2010 passed away  . Diabetes Brother     Social History   Socioeconomic History  . Marital status: Married    Spouse name: Lennette Bihari  . Number of children: 3  . Years of education: Not on file  . Highest education level: High school graduate  Occupational History  . Not on file  Social Needs  . Financial resource strain: Not hard at all  . Food insecurity    Worry: Never true    Inability: Never true  . Transportation needs    Medical: No    Non-medical: No  Tobacco Use  .  Smoking status: Former Smoker    Packs/day: 0.50    Years: 30.00    Pack years: 15.00    Types: Cigarettes    Start date: 04/19/1975    Quit date: 12/17/2005    Years since quitting: 12.9  . Smokeless tobacco: Never Used  Substance and Sexual Activity  . Alcohol use: No    Alcohol/week: 0.0 standard drinks  . Drug use: No  . Sexual activity: Not Currently    Partners: Male  Lifestyle  . Physical activity    Days per week: 0 days    Minutes per session: 0 min  . Stress: Only a little  Relationships  . Social Herbalist on phone: Three times a week    Gets together: Never    Attends religious service: 1 to 4 times per year    Active member of club or organization: No    Attends meetings of clubs or organizations: Never    Relationship status: Married  . Intimate partner violence    Fear of current or ex partner: No    Emotionally abused: No    Physically abused: No    Forced sexual activity: No  Other Topics Concern  . Not on file  Social History Narrative   Married.   She is taking online classes trying to finish her Psychology degree   She had 3 children, one of her daughter's died Jul 12, 2017 from [place] and currently raised her 3 children ( two girls and one boy ) and also lost her niece May 2019 and is raising her teenage daughter.      Current Outpatient Medications:  .  acetaminophen (TYLENOL) 500 MG tablet, Take 1 tablet (500 mg total) by mouth every 8 (eight) hours as needed., Disp: 90 tablet, Rfl: 0 .  amLODipine-benazepril (LOTREL) 5-20 MG capsule, Take 1 capsule by mouth daily., Disp: 90 capsule, Rfl: 1 .  atorvastatin (LIPITOR) 40 MG tablet, Take 1 tablet by mouth  daily at 6pm, Disp: 90 tablet, Rfl: 1 .  hydrochlorothiazide (HYDRODIURIL) 12.5 MG tablet, Take 1 tablet (12.5 mg total) by mouth daily., Disp: 90 tablet, Rfl: 1 .  traZODone (DESYREL) 100 MG tablet, Take 1 tablet (100 mg total) by mouth at bedtime., Disp: 90 tablet, Rfl: 1 .  aspirin EC 81 MG  tablet, Take 1 tablet (81 mg total) by mouth daily. (Patient not taking: Reported on 06/19/2018), Disp: 30 tablet, Rfl: 0 .  nitrofurantoin, macrocrystal-monohydrate, (MACROBID) 100 MG capsule, Take 1 capsule (100 mg total) by mouth 2 (two) times daily. (Patient not taking: Reported on 11/19/2018), Disp: 10 capsule, Rfl: 0 .  Vitamin D, Ergocalciferol, (DRISDOL) 1.25 MG (50000 UT) CAPS capsule, Take 1 capsule (50,000 Units total) by mouth every 7 (seven) days. (Patient not taking: Reported on 11/19/2018), Disp: 12 capsule,  Rfl: 0  No Known Allergies  I personally reviewed active problem list, medication list, allergies, family history, social history with the patient/caregiver today.   ROS   Ten systems reviewed and is negative except as mentioned in HPI   Objective  Virtual encounter, vitals not obtained.  Vitals:   11/19/18 1319  Weight: (!) 330 lb (149.7 kg)  Height: 5\' 7"  (1.702 m)   Body mass index is 51.69 kg/m.  Physical Exam  Awake, alert and oriented   PHQ2/9: Depression screen [facility] 2/9 11/19/2018 08/08/2018 06/19/2018 02/01/2018 12/01/2017  Decreased Interest 0 0 0 0 2  Down, Depressed, Hopeless 0 0 0 0 0  PHQ - 2 Score 0 0 0 0 2  Altered sleeping 1 0 1 1 1   Tired, decreased energy 0 0 1 0 1  Change in appetite 1 0 0 0 0  Feeling bad or failure about yourself  0 0 0 0 0  Trouble concentrating 0 0 0 0 0  Moving slowly or fidgety/restless 0 0 0 0 0  Suicidal thoughts 0 0 0 0 0  PHQ-9 Score 2 0 2 1 4   Difficult doing work/chores Not difficult at all Not difficult at all Not difficult at all Not difficult at all Somewhat difficult   PHQ-2/9 Result is negative.    Fall Risk: Fall Risk  08/08/2018 06/19/2018 02/01/2018 12/01/2017 06/15/2017  Falls in the past year? 0 0 No No No  Number falls in past yr: 0 - - - -  Injury with Fall? 0 - - - -     Assessment & Plan   1. Vitamin D deficiency  - Vitamin D, Cholecalciferol, 25 MCG (1000 UT) TABS; Take 1 tablet by mouth daily.   Dispense: 30 tablet; Refill: 0  2. Insomnia, persistent  - traZODone (DESYREL) 100 MG tablet; Take 1 tablet (100 mg total) by mouth at bedtime.  Dispense: 90 tablet; Refill: 1  3. Dyslipidemia  - atorvastatin (LIPITOR) 40 MG tablet; Take 1 tablet by mouth  daily at 6pm  Dispense: 90 tablet; Refill: 1  4. Benign essential HTN  - hydrochlorothiazide (HYDRODIURIL) 12.5 MG tablet; Take 1 tablet (12.5 mg total) by mouth daily.  Dispense: 90 tablet; Refill: 1 - amLODipine-benazepril (LOTREL) 5-20 MG capsule; Take 1 capsule by mouth daily.  Dispense: 90 capsule; Refill: 1  5. Obstructive apnea   6. Morbid obesity due to excess calories (HCC)  - Naltrexone-buPROPion HCl ER (CONTRAVE) 8-90 MG TB12; Take 2 tablets by mouth 2 (two) times daily as needed.  Dispense: 120 tablet; Refill: 1   I discussed the assessment and treatment plan with the patient. The patient was provided an opportunity to ask questions and all were answered. The patient agreed with the plan and demonstrated an understanding of the instructions.  The patient was advised to call back or seek an in-person evaluation if the symptoms worsen or if the condition fails to improve as anticipated.  I provided 25  minutes of non-face-to-face time during this encounter.

## 2019-01-28 ENCOUNTER — Ambulatory Visit: Payer: 59 | Admitting: Family Medicine

## 2019-02-04 ENCOUNTER — Encounter: Payer: Self-pay | Admitting: Family Medicine

## 2019-02-04 ENCOUNTER — Other Ambulatory Visit: Payer: Self-pay

## 2019-02-04 ENCOUNTER — Ambulatory Visit (INDEPENDENT_AMBULATORY_CARE_PROVIDER_SITE_OTHER): Payer: 59 | Admitting: Family Medicine

## 2019-02-04 VITALS — BP 124/78 | HR 80 | Temp 96.6°F | Resp 16 | Ht 67.0 in | Wt 331.0 lb

## 2019-02-04 DIAGNOSIS — Z1231 Encounter for screening mammogram for malignant neoplasm of breast: Secondary | ICD-10-CM

## 2019-02-04 DIAGNOSIS — Z0000 Encounter for general adult medical examination without abnormal findings: Secondary | ICD-10-CM | POA: Diagnosis not present

## 2019-02-04 DIAGNOSIS — Z23 Encounter for immunization: Secondary | ICD-10-CM | POA: Diagnosis not present

## 2019-02-04 DIAGNOSIS — E2839 Other primary ovarian failure: Secondary | ICD-10-CM

## 2019-02-04 DIAGNOSIS — I10 Essential (primary) hypertension: Secondary | ICD-10-CM

## 2019-02-04 DIAGNOSIS — I447 Left bundle-branch block, unspecified: Secondary | ICD-10-CM

## 2019-02-04 MED ORDER — CONTRAVE 8-90 MG PO TB12: 2.0000 | ORAL_TABLET | Freq: Two times a day (BID) | ORAL | 1 refills | Status: DC | PRN

## 2019-02-04 NOTE — Progress Notes (Addendum)
Name: Emma Oconnor   MRN: [id number]    DOB: 08-11-1955   Date:02/04/2019       Progress Note  Subjective  Chief Complaint  Chief Complaint  Patient presents with  . Annual Exam    HPI  Patient presents for annual CPE.  Obesity: we started her on Contrave mid August 2020, she states the day she started the medication her weight was 339 lbs, she states it has been curbing her appetite , she is currently taking two BID. She would like to continue medication. She states she realized she was eating too much and too often prior to starting medication , she also stopped craving sweets   Diet: more balanced diet Exercise: not currently, she used to go to the [facility] pool, however now having knee pain and pool was closed , she will contact them back to see when she can resume physical activity   USPSTF grade A and B recommendations    Office Visit from 02/04/2019 in [facility]  AUDIT-C Score  0     Depression: Phq 9 is  negative Depression screen [facility] 2/9 02/04/2019 11/19/2018 08/08/2018 06/19/2018 02/01/2018  Decreased Interest 0 0 0 0 0  Down, Depressed, Hopeless 0 0 0 0 0  PHQ - 2 Score 0 0 0 0 0  Altered sleeping 1 1 0 1 1  Tired, decreased energy 1 0 0 1 0  Change in appetite 0 1 0 0 0  Feeling bad or failure about yourself  0 0 0 0 0  Trouble concentrating 0 0 0 0 0  Moving slowly or fidgety/restless 0 0 0 0 0  Suicidal thoughts 0 0 0 0 0  PHQ-9 Score 2 2 0 2 1  Difficult doing work/chores Not difficult at all Not difficult at all Not difficult at all Not difficult at all Not difficult at all  Some recent data might be hidden   Hypertension: BP Readings from Last 3 Encounters:  02/04/19 124/78  06/19/18 122/78  02/01/18 130/70   Obesity: Wt Readings from Last 3 Encounters:  02/04/19 (!) 331 lb (150.1 kg)  11/19/18 (!) 330 lb (149.7 kg)  06/19/18 (!) 321 lb 4.8 oz (145.7 kg)   BMI Readings from Last 3 Encounters:  02/04/19 51.84 kg/m  11/19/18 51.69  kg/m  06/19/18 50.32 kg/m     Hep C Screening: 11/2012  STD testing and prevention (HIV/chl/gon/syphilis): N/A Intimate partner violence: negative screen  Sexual History/Pain during Intercourse: not in years  Menstrual History/LMP/Abnormal Bleeding: discussed importance of follow up if any post-menopausal bleeding  Incontinence Symptoms: mild stress incontinence , discussed kegel exercises   Breast cancer:  - Last Mammogram: 04/2018  - BRCA gene screening: N/A  Osteoporosis Screening: discussed high calcium diet and vitamin D, also discussed bone density test   Cervical cancer screening: repeat in 11/2019   Skin cancer: discussed atypical lesions  Colorectal cancer: next one due 02/2020  Lung cancer:  Low Dose CT Chest recommended if Age 24-80 years, 30 pack-year currently smoking OR have quit w/in 15years. Patient does not qualify.   ECG: today   Advanced Care Planning: A voluntary discussion about advance care planning including the explanation and discussion of advance directives.  Discussed health care proxy and Living will, and the patient was able to identify a health care proxy as husband .  Patient does not have a living will at present time. If patient does have living will, I have requested they bring this  to the clinic to be scanned in to their chart.  Lipids: Lab Results  Component Value Date   CHOL 213 (H) 03/14/2018   CHOL 162 11/29/2016   CHOL 161 12/08/2015   Lab Results  Component Value Date   HDL 63 03/14/2018   HDL 58 11/29/2016   HDL 64 12/08/2015   Lab Results  Component Value Date   LDLCALC 130 (H) 03/14/2018   LDLCALC 89 11/29/2016   LDLCALC 81 12/08/2015   Lab Results  Component Value Date   TRIG 99 03/14/2018   TRIG 74 11/29/2016   TRIG 79 12/08/2015   Lab Results  Component Value Date   CHOLHDL 3.4 03/14/2018   CHOLHDL 2.8 11/29/2016   CHOLHDL 2.5 12/08/2015   No results found for: LDLDIRECT  Glucose: Glucose  Date Value Ref  Range Status  03/14/2018 90 65 - 99 mg/dL Final  11/29/2016 94 65 - 99 mg/dL Final  12/08/2015 88 65 - 99 mg/dL Final    Patient Active Problem List   Diagnosis Date Noted  . Benign neoplasm of ascending colon   . Benign neoplasm of sigmoid colon   . Benign essential HTN 10/25/2014  . Bursitis of shoulder 10/25/2014  . Insomnia, persistent 10/25/2014  . Dyslipidemia 10/25/2014  . Female stress incontinence 10/25/2014  . Eczema intertrigo 10/25/2014  . Menopause 10/25/2014  . Dysmetabolic syndrome 67/34/1937  . Extreme obesity 10/25/2014  . Obstructive apnea 10/25/2014  . Vitamin D deficiency 10/25/2014  . Skin tag 10/25/2014  . Knee pain 10/25/2014    Past Surgical History:  Procedure Laterality Date  . COLONOSCOPY WITH PROPOFOL N/A 02/17/2015   Procedure: COLONOSCOPY WITH PROPOFOL;  Surgeon: Lucilla Lame, MD;  Location: [facility] ENDOSCOPY;  Service: Endoscopy;  Laterality: N/A;  . TUBAL LIGATION      Family History  Problem Relation Age of Onset  . Diabetes Mother   . Hypertension Mother   . Hyperlipidemia Mother   . Multiple sclerosis Brother        11-Jul-2010 passed away  . Diabetes Brother     Social History   Socioeconomic History  . Marital status: Married    Spouse name: Lennette Bihari  . Number of children: 3  . Years of education: Not on file  . Highest education level: High school graduate  Occupational History  . Not on file  Social Needs  . Financial resource strain: Not hard at all  . Food insecurity    Worry: Never true    Inability: Never true  . Transportation needs    Medical: No    Non-medical: No  Tobacco Use  . Smoking status: Former Smoker    Packs/day: 0.50    Years: 30.00    Pack years: 15.00    Types: Cigarettes    Start date: 04/19/1975    Quit date: 12/17/2005    Years since quitting: 13.1  . Smokeless tobacco: Never Used  Substance and Sexual Activity  . Alcohol use: No    Alcohol/week: 0.0 standard drinks  . Drug use: No  . Sexual activity:  Not Currently    Partners: Male  Lifestyle  . Physical activity    Days per week: 0 days    Minutes per session: 0 min  . Stress: Only a little  Relationships  . Social Herbalist on phone: Three times a week    Gets together: Never    Attends religious service: 1 to 4 times per year    Active  member of club or organization: No    Attends meetings of clubs or organizations: Never    Relationship status: Married  . Intimate partner violence    Fear of current or ex partner: No    Emotionally abused: No    Physically abused: No    Forced sexual activity: No  Other Topics Concern  . Not on file  Social History Narrative   Married.   She is taking online classes trying to finish her Psychology degree   She had 3 children, one of her daughter's died 11-Jul-2017 from [place] and currently raised her 3 children ( two girls and one boy ) and also lost her niece May 2019 and is raising her teenage daughter.      Current Outpatient Medications:  .  acetaminophen (TYLENOL) 500 MG tablet, Take 1 tablet (500 mg total) by mouth every 8 (eight) hours as needed., Disp: 90 tablet, Rfl: 0 .  amLODipine-benazepril (LOTREL) 5-20 MG capsule, Take 1 capsule by mouth daily., Disp: 90 capsule, Rfl: 1 .  atorvastatin (LIPITOR) 40 MG tablet, Take 1 tablet by mouth  daily at 6pm, Disp: 90 tablet, Rfl: 1 .  hydrochlorothiazide (HYDRODIURIL) 12.5 MG tablet, Take 1 tablet (12.5 mg total) by mouth daily., Disp: 90 tablet, Rfl: 1 .  Naltrexone-buPROPion HCl ER (CONTRAVE) 8-90 MG TB12, Take 2 tablets by mouth 2 (two) times daily as needed., Disp: 120 tablet, Rfl: 1 .  traZODone (DESYREL) 100 MG tablet, Take 1 tablet (100 mg total) by mouth at bedtime., Disp: 90 tablet, Rfl: 1 .  Vitamin D, Cholecalciferol, 25 MCG (1000 UT) TABS, Take 1 tablet by mouth daily., Disp: 30 tablet, Rfl: 0  No Known Allergies   ROS  Constitutional: Negative for fever positive for  weight change.  Respiratory: Negative for  cough and shortness of breath.   Cardiovascular: Negative for chest pain or palpitations.  Gastrointestinal: Negative for abdominal pain, no bowel changes.  Musculoskeletal: Negative for gait problem or joint swelling.  Skin: Negative for rash.  Neurological: Negative for dizziness or headache.  No other specific complaints in a complete review of systems (except as listed in HPI above).  Objective  Vitals:   02/04/19 0920  BP: 124/78  Pulse: 80  Resp: 16  Temp: (!) 96.6 F (35.9 C)  TempSrc: Temporal  SpO2: 98%  Weight: (!) 331 lb (150.1 kg)  Height: 5' 7"  (1.702 m)    Body mass index is 51.84 kg/m.  Physical Exam  Constitutional: Patient appears well-developed and well-nourished. No distress.  HENT: Head: Normocephalic and atraumatic. Ears: B TMs ok, no erythema or effusion; Nose: Nose normal. Mouth/Throat: Oropharynx is clear and moist. No oropharyngeal exudate.  Eyes: Conjunctivae and EOM are normal. Pupils are equal, round, and reactive to light. No scleral icterus.  Neck: Normal range of motion. Neck supple. No JVD present. No thyromegaly present.  Cardiovascular: Normal rate, regular rhythm and normal heart sounds.  No murmur heard. No BLE edema. Pulmonary/Chest: Effort normal and breath sounds normal. No respiratory distress. Abdominal: Soft. Bowel sounds are normal, no distension. There is no tenderness. no masses Breast: no lumps or masses, no nipple discharge or rashes FEMALE GENITALIA:not done RECTAL: not done Musculoskeletal: Normal range of motion, no joint effusions. No gross deformities Neurological: he is alert and oriented to person, place, and time. No cranial nerve deficit. Coordination, balance, strength, speech and gait are normal.  Skin: Skin is warm and dry. No rash noted. No erythema.  Psychiatric: Patient  has a normal mood and affect. behavior is normal. Judgment and thought content normal.  Fall Risk: Fall Risk  02/04/2019 08/08/2018 06/19/2018  02/01/2018 12/01/2017  Falls in the past year? 0 0 0 No No  Number falls in past yr: 0 0 - - -  Injury with Fall? 0 0 - - -     Functional Status Survey: Is the patient deaf or have difficulty hearing?: No Does the patient have difficulty seeing, even when wearing glasses/contacts?: No Does the patient have difficulty concentrating, remembering, or making decisions?: No Does the patient have difficulty walking or climbing stairs?: No Does the patient have difficulty dressing or bathing?: No Does the patient have difficulty doing errands alone such as visiting a doctor's office or shopping?: No   Assessment & Plan  1. Well adult exam  - EKG 12-Lead  - Lipid panel - Comprehensive metabolic panel - CBC with Differential/Platelet - Hemoglobin A1c - Vitamin B12 - VITAMIN D 25 Hydroxy (Vit-D Deficiency, Fractures) - TSH  2. Needs flu shot  Refused  3. Breast cancer screening by mammogram  - MM 3D SCREEN BREAST BILATERAL; Future  4. Morbid obesity due to excess calories (HCC)  - Naltrexone-buPROPion HCl ER (CONTRAVE) 8-90 MG TB12; Take 2 tablets by mouth 2 (two) times daily as needed.  Dispense: 120 tablet; Refill: 1  5. Benign essential HTN  - EKG 12-Lead  6. Ovarian failure  - DG Bone Density; Future  7. Need for shingles vaccine  refused  8. Need for pneumococcal vaccine  refused   9. LBBB (left bundle branch block)  Discussed referral to cardiologist but she wants to hold off until Dec for referral   -USPSTF grade A and B recommendations reviewed with patient; age-appropriate recommendations, preventive care, screening tests, etc discussed and encouraged; healthy living encouraged; see AVS for patient education given to patient -Discussed importance of 150 minutes of physical activity weekly, eat two servings of fish weekly, eat one serving of tree nuts ( cashews, pistachios, pecans, almonds.Marland Kitchen) every other day, eat 6 servings of fruit/vegetables daily and  drink plenty of water and avoid sweet beverages.

## 2019-02-04 NOTE — Patient Instructions (Addendum)
Please call insurance to find out if they cover bone density test ( for ovarian failure/osteoporosis screen) Also check coverage for shingrix vaccine   Preventive Care 61-61 Years Old, Female Preventive care refers to visits with your health care provider and lifestyle choices that can promote health and wellness. This includes:  A yearly physical exam. This may also be called an annual well check.  Regular dental visits and eye exams.  Immunizations.  Screening for certain conditions.  Healthy lifestyle choices, such as eating a healthy diet, getting regular exercise, not using drugs or products that contain nicotine and tobacco, and limiting alcohol use. What can I expect for my preventive care visit? Physical exam Your health care provider will check your:  Height and weight. This may be used to calculate body mass index (BMI), which tells if you are at a healthy weight.  Heart rate and blood pressure.  Skin for abnormal spots. Counseling Your health care provider may ask you questions about your:  Alcohol, tobacco, and drug use.  Emotional well-being.  Home and relationship well-being.  Sexual activity.  Eating habits.  Work and work Statistician.  Method of birth control.  Menstrual cycle.  Pregnancy history. What immunizations do I need?  Influenza (flu) vaccine  This is recommended every year. Tetanus, diphtheria, and pertussis (Tdap) vaccine  You may need a Td booster every 10 years. Varicella (chickenpox) vaccine  You may need this if you have not been vaccinated. Zoster (shingles) vaccine  You may need this after age 65. Measles, mumps, and rubella (MMR) vaccine  You may need at least one dose of MMR if you were born in 1957 or later. You may also need a second dose. Pneumococcal conjugate (PCV13) vaccine  You may need this if you have certain conditions and were not previously vaccinated. Pneumococcal polysaccharide (PPSV23) vaccine  You  may need one or two doses if you smoke cigarettes or if you have certain conditions. Meningococcal conjugate (MenACWY) vaccine  You may need this if you have certain conditions. Hepatitis A vaccine  You may need this if you have certain conditions or if you travel or work in places where you may be exposed to hepatitis A. Hepatitis B vaccine  You may need this if you have certain conditions or if you travel or work in places where you may be exposed to hepatitis B. Haemophilus influenzae type b (Hib) vaccine  You may need this if you have certain conditions. Human papillomavirus (HPV) vaccine  If recommended by your health care provider, you may need three doses over 6 months. You may receive vaccines as individual doses or as more than one vaccine together in one shot (combination vaccines). Talk with your health care provider about the risks and benefits of combination vaccines. What tests do I need? Blood tests  Lipid and cholesterol levels. These may be checked every 5 years, or more frequently if you are over 56 years old.  Hepatitis C test.  Hepatitis B test. Screening  Lung cancer screening. You may have this screening every year starting at age 55 if you have a 30-pack-year history of smoking and currently smoke or have quit within the past 15 years.  Colorectal cancer screening. All adults should have this screening starting at age 10 and continuing until age 88. Your health care provider may recommend screening at age 4 if you are at increased risk. You will have tests every 1-10 years, depending on your results and the type of screening test.  Diabetes screening. This is done by checking your blood sugar (glucose) after you have not eaten for a while (fasting). You may have this done every 1-3 years.  Mammogram. This may be done every 1-2 years. Talk with your health care provider about when you should start having regular mammograms. This may depend on whether you have a  family history of breast cancer.  BRCA-related cancer screening. This may be done if you have a family history of breast, ovarian, tubal, or peritoneal cancers.  Pelvic exam and Pap test. This may be done every 3 years starting at age 100. Starting at age 73, this may be done every 5 years if you have a Pap test in combination with an HPV test. Other tests  Sexually transmitted disease (STD) testing.  Bone density scan. This is done to screen for osteoporosis. You may have this scan if you are at high risk for osteoporosis. Follow these instructions at home: Eating and drinking  Eat a diet that includes fresh fruits and vegetables, whole grains, lean protein, and low-fat dairy.  Take vitamin and mineral supplements as recommended by your health care provider.  Do not drink alcohol if: ? Your health care provider tells you not to drink. ? You are pregnant, may be pregnant, or are planning to become pregnant.  If you drink alcohol: ? Limit how much you have to 0-1 drink a day. ? Be aware of how much alcohol is in your drink. In the [place] one drink equals one 12 oz bottle of beer (355 mL), one 5 oz glass of wine (148 mL), or one 1 oz glass of hard liquor (44 mL). Lifestyle  Take daily care of your teeth and gums.  Stay active. Exercise for at least 30 minutes on 5 or more days each week.  Do not use any products that contain nicotine or tobacco, such as cigarettes, e-cigarettes, and chewing tobacco. If you need help quitting, ask your health care provider.  If you are sexually active, practice safe sex. Use a condom or other form of birth control (contraception) in order to prevent pregnancy and STIs (sexually transmitted infections).  If told by your health care provider, take low-dose aspirin daily starting at age 32. What's next?  Visit your health care provider once a year for a well check visit.  Ask your health care provider how often you should have your eyes and teeth  checked.  Stay up to date on all vaccines. This information is not intended to replace advice given to you by your health care provider. Make sure you discuss any questions you have with your health care provider. Document Released: 05/01/2015 Document Revised: 12/14/2017 Document Reviewed: 12/14/2017 Elsevier Patient Education  2020 [employer].

## 2019-02-08 ENCOUNTER — Other Ambulatory Visit: Payer: Self-pay | Admitting: Family Medicine

## 2019-03-30 ENCOUNTER — Other Ambulatory Visit: Payer: Self-pay | Admitting: Family Medicine

## 2019-03-30 DIAGNOSIS — G4700 Insomnia, unspecified: Secondary | ICD-10-CM

## 2019-03-30 DIAGNOSIS — I10 Essential (primary) hypertension: Secondary | ICD-10-CM

## 2019-03-30 DIAGNOSIS — E785 Hyperlipidemia, unspecified: Secondary | ICD-10-CM

## 2019-06-20 ENCOUNTER — Ambulatory Visit
Admission: RE | Admit: 2019-06-20 | Discharge: 2019-06-20 | Disposition: A | Discharge status: Home / Self Care | Payer: 59 | Source: Ambulatory Visit | Attending: Family Medicine | Admitting: Family Medicine

## 2019-06-20 DIAGNOSIS — Z1231 Encounter for screening mammogram for malignant neoplasm of breast: Secondary | ICD-10-CM

## 2019-06-20 DIAGNOSIS — E2839 Other primary ovarian failure: Secondary | ICD-10-CM | POA: Diagnosis present

## 2019-06-21 ENCOUNTER — Telehealth: Payer: Self-pay

## 2019-06-21 NOTE — Telephone Encounter (Signed)
Copied from [place] [phone]. Topic: General - Other >> Jun 21, 2019  3:30 PM Wynetta Emery, [place] C wrote: Reason for CRM: pt called in stating that she is returning the office call, not showing a note in chart.    Please assist.

## 2019-07-02 LAB — COMPREHENSIVE METABOLIC PANEL
ALT: 27 IU/L (ref 0–32)
AST: 21 IU/L (ref 0–40)
Albumin/Globulin Ratio: 1.7 (ref 1.2–2.2)
Albumin: 4 g/dL (ref 3.8–4.8)
Alkaline Phosphatase: 112 IU/L (ref 39–117)
BUN/Creatinine Ratio: 7 — ABNORMAL LOW (ref 12–28)
BUN: 6 mg/dL — ABNORMAL LOW (ref 8–27)
Bilirubin Total: 0.5 mg/dL (ref 0.0–1.2)
CO2: 23 mmol/L (ref 20–29)
Calcium: 9.2 mg/dL (ref 8.7–10.3)
Chloride: 103 mmol/L (ref 96–106)
Creatinine, Ser: 0.82 mg/dL (ref 0.57–1.00)
GFR calc Af Amer: 89 mL/min/{1.73_m2} (ref >59)
GFR calc non Af Amer: 77 mL/min/{1.73_m2} (ref >59)
Globulin, Total: 2.4 g/dL (ref 1.5–4.5)
Glucose: 89 mg/dL (ref 65–99)
Potassium: 3.3 mmol/L — ABNORMAL LOW (ref 3.5–5.2)
Sodium: 141 mmol/L (ref 134–144)
Total Protein: 6.4 g/dL (ref 6.0–8.5)

## 2019-07-02 LAB — CBC WITH DIFFERENTIAL/PLATELET
Basophils Absolute: 0 10*3/uL (ref 0.0–0.2)
Basos: 1 %
EOS (ABSOLUTE): 0 10*3/uL (ref 0.0–0.4)
Eos: 0 %
Hematocrit: 44 % (ref 34.0–46.6)
Hemoglobin: 14.5 g/dL (ref 11.1–15.9)
Immature Grans (Abs): 0 10*3/uL (ref 0.0–0.1)
Immature Granulocytes: 0 %
Lymphocytes Absolute: 0.9 10*3/uL (ref 0.7–3.1)
Lymphs: 19 %
MCH: 29.7 pg (ref 26.6–33.0)
MCHC: 33 g/dL (ref 31.5–35.7)
MCV: 90 fL (ref 79–97)
Monocytes Absolute: 0.7 10*3/uL (ref 0.1–0.9)
Monocytes: 14 %
Neutrophils Absolute: 3.2 10*3/uL (ref 1.4–7.0)
Neutrophils: 66 %
Platelets: 78 10*3/uL — CL (ref 150–450)
RBC: 4.88 x10E6/uL (ref 3.77–5.28)
RDW: 13.6 % (ref 11.7–15.4)
WBC: 4.9 10*3/uL (ref 3.4–10.8)

## 2019-07-02 LAB — HEMOGLOBIN A1C
Est. average glucose Bld gHb Est-mCnc: 114 mg/dL
Hgb A1c MFr Bld: 5.6 % (ref 4.8–5.6)

## 2019-07-02 LAB — VITAMIN B12: Vitamin B-12: 912 pg/mL (ref 232–1245)

## 2019-07-02 LAB — LIPID PANEL
Chol/HDL Ratio: 4.2 ratio (ref 0.0–4.4)
Cholesterol, Total: 251 mg/dL — ABNORMAL HIGH (ref 100–199)
HDL: 60 mg/dL (ref >39)
LDL Chol Calc (NIH): 169 mg/dL — ABNORMAL HIGH (ref 0–99)
Triglycerides: 123 mg/dL (ref 0–149)
VLDL Cholesterol Cal: 22 mg/dL (ref 5–40)

## 2019-07-02 LAB — TSH: TSH: 1.08 u[IU]/mL (ref 0.450–4.500)

## 2019-07-02 LAB — VITAMIN D 25 HYDROXY (VIT D DEFICIENCY, FRACTURES): Vit D, 25-Hydroxy: 25.1 ng/mL — ABNORMAL LOW (ref 30.0–100.0)

## 2019-07-03 ENCOUNTER — Other Ambulatory Visit: Payer: Self-pay | Admitting: Family Medicine

## 2019-07-03 DIAGNOSIS — D696 Thrombocytopenia, unspecified: Secondary | ICD-10-CM

## 2019-07-03 DIAGNOSIS — E876 Hypokalemia: Secondary | ICD-10-CM

## 2019-07-03 MED ORDER — POTASSIUM CHLORIDE CRYS ER 10 MEQ PO TBCR: 10.0000 meq | EXTENDED_RELEASE_TABLET | Freq: Two times a day (BID) | ORAL | 0 refills | Status: DC

## 2019-07-04 ENCOUNTER — Other Ambulatory Visit: Payer: Self-pay | Admitting: Family Medicine

## 2019-07-04 DIAGNOSIS — E876 Hypokalemia: Secondary | ICD-10-CM

## 2019-07-04 DIAGNOSIS — D696 Thrombocytopenia, unspecified: Secondary | ICD-10-CM

## 2019-07-04 NOTE — Progress Notes (Signed)
Done

## 2019-08-01 LAB — CBC WITH DIFFERENTIAL/PLATELET
Basophils Absolute: 0 10*3/uL (ref 0.0–0.2)
Basos: 1 %
EOS (ABSOLUTE): 0.1 10*3/uL (ref 0.0–0.4)
Eos: 2 %
Hematocrit: 43.3 % (ref 34.0–46.6)
Hemoglobin: 14.1 g/dL (ref 11.1–15.9)
Immature Grans (Abs): 0 10*3/uL (ref 0.0–0.1)
Immature Granulocytes: 0 %
Lymphocytes Absolute: 1.7 10*3/uL (ref 0.7–3.1)
Lymphs: 28 %
MCH: 29.4 pg (ref 26.6–33.0)
MCHC: 32.6 g/dL (ref 31.5–35.7)
MCV: 90 fL (ref 79–97)
Monocytes Absolute: 0.6 10*3/uL (ref 0.1–0.9)
Monocytes: 10 %
Neutrophils Absolute: 3.6 10*3/uL (ref 1.4–7.0)
Neutrophils: 59 %
Platelets: 183 10*3/uL (ref 150–450)
RBC: 4.8 x10E6/uL (ref 3.77–5.28)
RDW: 13.2 % (ref 11.7–15.4)
WBC: 6.1 10*3/uL (ref 3.4–10.8)

## 2019-08-01 LAB — POTASSIUM: Potassium: 3.9 mmol/L (ref 3.5–5.2)

## 2019-08-01 LAB — MAGNESIUM: Magnesium: 2.1 mg/dL (ref 1.6–2.3)

## 2019-08-03 ENCOUNTER — Other Ambulatory Visit: Payer: Self-pay | Admitting: Family Medicine

## 2019-09-09 ENCOUNTER — Encounter: Payer: Self-pay | Admitting: Family Medicine

## 2019-09-09 ENCOUNTER — Ambulatory Visit: Payer: 59 | Admitting: Family Medicine

## 2019-09-09 ENCOUNTER — Other Ambulatory Visit: Payer: Self-pay

## 2019-09-09 DIAGNOSIS — G4733 Obstructive sleep apnea (adult) (pediatric): Secondary | ICD-10-CM

## 2019-09-09 DIAGNOSIS — I10 Essential (primary) hypertension: Secondary | ICD-10-CM

## 2019-09-09 DIAGNOSIS — E8881 Metabolic syndrome: Secondary | ICD-10-CM

## 2019-09-09 DIAGNOSIS — E559 Vitamin D deficiency, unspecified: Secondary | ICD-10-CM

## 2019-09-09 DIAGNOSIS — G4700 Insomnia, unspecified: Secondary | ICD-10-CM

## 2019-09-09 DIAGNOSIS — E785 Hyperlipidemia, unspecified: Secondary | ICD-10-CM

## 2019-09-09 DIAGNOSIS — F320 Major depressive disorder, single episode, mild: Secondary | ICD-10-CM

## 2019-09-09 DIAGNOSIS — D696 Thrombocytopenia, unspecified: Secondary | ICD-10-CM

## 2019-09-09 MED ORDER — POTASSIUM CHLORIDE CRYS ER 10 MEQ PO TBCR: 10.0000 meq | EXTENDED_RELEASE_TABLET | Freq: Every day | ORAL | 1 refills | Status: DC

## 2019-09-09 MED ORDER — HYDROCHLOROTHIAZIDE 12.5 MG PO TABS: 12.5000 mg | ORAL_TABLET | Freq: Every day | ORAL | 1 refills | Status: DC

## 2019-09-09 MED ORDER — AMLODIPINE BESY-BENAZEPRIL HCL 5-20 MG PO CAPS: 1.0000 | ORAL_CAPSULE | Freq: Every day | ORAL | 1 refills | Status: DC

## 2019-09-09 MED ORDER — INSULIN PEN NEEDLE 30G X 8 MM MISC: 1.0000 | 1 refills | Status: DC | PRN

## 2019-09-09 MED ORDER — SAXENDA 18 MG/3ML ~~LOC~~ SOPN: 0.6000 mg | PEN_INJECTOR | Freq: Every day | SUBCUTANEOUS | 0 refills | Status: DC

## 2019-09-09 MED ORDER — TRAZODONE HCL 100 MG PO TABS: 100.0000 mg | ORAL_TABLET | Freq: Every day | ORAL | 1 refills | Status: DC

## 2019-09-09 MED ORDER — ATORVASTATIN CALCIUM 40 MG PO TABS: 40.0000 mg | ORAL_TABLET | Freq: Every day | ORAL | 1 refills | Status: DC

## 2019-09-09 NOTE — Patient Instructions (Signed)
Saxenda start at 0.6 mg for one week Second week 1.2 Third week 1.8 Forth week 2.4 After that stay on 3 mg daily

## 2019-09-09 NOTE — Progress Notes (Signed)
Name: Emma Oconnor   MRN: [id number]    DOB: [birth year]   Date:09/09/2019       Progress Note  Subjective  Chief Complaint  Chief Complaint  Patient presents with  . Hypertension    3 month recheck  . Hyperlipidemia  . Obesity    HPI  HTN: taking bpmedication Lotrel 5/20 at night , hydrochlorothiazide 12.5 mg in am . She denies side effects, no dizziness, headaches, palpitation  or chest pain   Pre-diabetes: hgbA1C was 5.7% but last time it was down to 5.6%. She  denies polydipsia, polyuria or polyphagia.Weight has gone up again, she is trying walk at least 5 thousands steps per day.   Hyperlipidemia: taking Atorvastatin, last LDL was elevated, she states she was not taking medication at the time the labs was done, she states it was causing hip pain. She states she is taking it every other day to every two days. We will recheck labs in August   Insomnia: Trazodone helps her fallasleep, takes it every night,sometimes her mind is busy at night. Taking care of aging parents. Also raising her daughter's kids  Obesity: we started her on Contrave mid August 2020, she states the day she started the medication her weight was 339 lbs, she states it was  curbing her appetite , she stopped medication Dec 2020 because insurance stopped covering it. She is eating sweets again and not planning her meals. Discussed trying Saxenda but she is afraid of shots. She denies personal history of pancreatitis or family history of pancreatitis . She agreed on trying the medication. Discussed possible side effects, decrease portion size   OSA: wears CPAP every night now.She has been sleeping better.Unchanged  Depression Major:she has a long history of depressiongot worse when she lost her daughter at70 yo from a MVA Jul 30, 2017 raised 3 of her grandchildren, currently only taking care of her 38 yo niece and 27 yo granddaughter. She is now helping her aging grandparents.   Patient Active  Problem List   Diagnosis Date Noted  . Benign neoplasm of ascending colon   . Benign neoplasm of sigmoid colon   . Benign essential HTN 10/25/2014  . Bursitis of shoulder 10/25/2014  . Insomnia, persistent 10/25/2014  . Dyslipidemia 10/25/2014  . Female stress incontinence 10/25/2014  . Eczema intertrigo 10/25/2014  . Menopause 10/25/2014  . Dysmetabolic syndrome 0000000  . Extreme obesity 10/25/2014  . Obstructive apnea 10/25/2014  . Vitamin D deficiency 10/25/2014  . Skin tag 10/25/2014  . Knee pain 10/25/2014    Past Surgical History:  Procedure Laterality Date  . COLONOSCOPY WITH PROPOFOL N/A 02/17/2015   Procedure: COLONOSCOPY WITH PROPOFOL;  Surgeon: Lucilla Lame, MD;  Location: [facility] ENDOSCOPY;  Service: Endoscopy;  Laterality: N/A;  . TUBAL LIGATION      Family History  Problem Relation Age of Onset  . Diabetes Mother   . Hypertension Mother   . Hyperlipidemia Mother   . Multiple sclerosis Brother        31-Jul-2010 passed away  . Diabetes Brother   . Breast cancer Neg Hx     Social History   Tobacco Use  . Smoking status: Former Smoker    Packs/day: 0.50    Years: 30.00    Pack years: 15.00    Types: Cigarettes    Start date: 04/19/1975    Quit date: 12/17/2005    Years since quitting: 13.7  . Smokeless tobacco: Never Used  Substance Use Topics  . Alcohol use:  No    Alcohol/week: 0.0 standard drinks     Current Outpatient Medications:  .  acetaminophen (TYLENOL) 500 MG tablet, Take 1 tablet (500 mg total) by mouth every 8 (eight) hours as needed., Disp: 90 tablet, Rfl: 0 .  amLODipine-benazepril (LOTREL) 5-20 MG capsule, Take 1 capsule by mouth daily., Disp: 90 capsule, Rfl: 1 .  atorvastatin (LIPITOR) 40 MG tablet, Take 1 tablet (40 mg total) by mouth daily., Disp: 90 tablet, Rfl: 1 .  hydrochlorothiazide (HYDRODIURIL) 12.5 MG tablet, Take 1 tablet (12.5 mg total) by mouth daily., Disp: 90 tablet, Rfl: 1 .  potassium chloride (KLOR-CON) 10 MEQ tablet, Take 1  tablet (10 mEq total) by mouth daily., Disp: 90 tablet, Rfl: 1 .  traZODone (DESYREL) 100 MG tablet, Take 1 tablet (100 mg total) by mouth at bedtime., Disp: 90 tablet, Rfl: 1 .  Vitamin D, Cholecalciferol, 25 MCG (1000 UT) TABS, Take 1 tablet by mouth daily., Disp: 30 tablet, Rfl: 0 .  Insulin Pen Needle (NOVOFINE) 30G X 8 MM MISC, Inject 10 each into the skin as needed., Disp: 100 each, Rfl: 1 .  Liraglutide -Weight Management (SAXENDA) 18 MG/3ML SOPN, Inject 0.1-0.5 mLs (0.6-3 mg total) into the skin daily., Disp: 45 mL, Rfl: 0  No Known Allergies  I personally reviewed active problem list, medication list, allergies, family history, social history, health maintenance with the patient/caregiver today.   ROS  Constitutional: Negative for fever, positive for  weight change.  Respiratory: Negative for cough and shortness of breath.   Cardiovascular: Negative for chest pain or palpitations.  Gastrointestinal: Negative for abdominal pain, no bowel changes.  Musculoskeletal: Negative for gait problem or joint swelling.  Skin: Negative for rash.  Neurological: Negative for dizziness or headache.  No other specific complaints in a complete review of systems (except as listed in HPI above).  Objective  Vitals:   09/09/19 0739  BP: 130/80  Pulse: 100  Resp: 18  Temp: 97.8 F (36.6 C)  TempSrc: Temporal  SpO2: 93%  Weight: (!) 340 lb (154.2 kg)  Height: 5\' 7"  (1.702 m)    Body mass index is 53.25 kg/m.  Physical Exam  Constitutional: Patient appears well-developed and well-nourished. Obese  No distress.  HEENT: head atraumatic, normocephalic, pupils equal and reactive to light, neck supple Cardiovascular: Normal rate, regular rhythm and normal heart sounds.  No murmur heard. No BLE edema. Pulmonary/Chest: Effort normal and breath sounds normal. No respiratory distress. Abdominal: Soft.  There is no tenderness. Psychiatric: Patient has a normal mood and affect. behavior is  normal. Judgment and thought content normal.  Recent Results (from the past 2160 hour(s))  Lipid panel     Status: Abnormal   Collection Time: 07/01/19  7:23 AM  Result Value Ref Range   Cholesterol, Total 251 (H) 100 - 199 mg/dL   Triglycerides 123 0 - 149 mg/dL   HDL 60 >39 mg/dL   VLDL Cholesterol Cal 22 5 - 40 mg/dL   LDL Chol Calc (NIH) 169 (H) 0 - 99 mg/dL   Chol/HDL Ratio 4.2 0.0 - 4.4 ratio    Comment:                                   T. Chol/HDL Ratio  Men  Women                               1/2 Avg.Risk  3.4    3.3                                   Avg.Risk  5.0    4.4                                2X Avg.Risk  9.6    7.1                                3X Avg.Risk 23.4   11.0   Comprehensive metabolic panel     Status: Abnormal   Collection Time: 07/01/19  7:23 AM  Result Value Ref Range   Glucose 89 65 - 99 mg/dL   BUN 6 (L) 8 - 27 mg/dL   Creatinine, Ser 0.82 0.57 - 1.00 mg/dL   GFR calc non Af Amer 77 >59 mL/min/1.73   GFR calc Af Amer 89 >59 mL/min/1.73   BUN/Creatinine Ratio 7 (L) 12 - 28   Sodium 141 134 - 144 mmol/L   Potassium 3.3 (L) 3.5 - 5.2 mmol/L   Chloride 103 96 - 106 mmol/L   CO2 23 20 - 29 mmol/L   Calcium 9.2 8.7 - 10.3 mg/dL   Total Protein 6.4 6.0 - 8.5 g/dL   Albumin 4.0 3.8 - 4.8 g/dL   Globulin, Total 2.4 1.5 - 4.5 g/dL   Albumin/Globulin Ratio 1.7 1.2 - 2.2   Bilirubin Total 0.5 0.0 - 1.2 mg/dL   Alkaline Phosphatase 112 39 - 117 IU/L   AST 21 0 - 40 IU/L   ALT 27 0 - 32 IU/L  CBC with Differential/Platelet     Status: Abnormal   Collection Time: 07/01/19  7:23 AM  Result Value Ref Range   WBC 4.9 3.4 - 10.8 x10E3/uL   RBC 4.88 3.77 - 5.28 x10E6/uL   Hemoglobin 14.5 11.1 - 15.9 g/dL   Hematocrit 44.0 34.0 - 46.6 %   MCV 90 79 - 97 fL   MCH 29.7 26.6 - 33.0 pg   MCHC 33.0 31.5 - 35.7 g/dL   RDW 13.6 11.7 - 15.4 %   Platelets 78 (LL) 150 - 450 x10E3/uL    Comment: Platelets vary in  size. Actual platelet count may be somewhat higher than reported due to aggregation of platelets in this sample.    Neutrophils 66 Not Estab. %   Lymphs 19 Not Estab. %   Monocytes 14 Not Estab. %   Eos 0 Not Estab. %   Basos 1 Not Estab. %   Neutrophils Absolute 3.2 1.4 - 7.0 x10E3/uL   Lymphocytes Absolute 0.9 0.7 - 3.1 x10E3/uL   Monocytes Absolute 0.7 0.1 - 0.9 x10E3/uL   EOS (ABSOLUTE) 0.0 0.0 - 0.4 x10E3/uL   Basophils Absolute 0.0 0.0 - 0.2 x10E3/uL   Immature Granulocytes 0 Not Estab. %   Immature Grans (Abs) 0.0 0.0 - 0.1 x10E3/uL   Hematology Comments: Note:     Comment: Verified by microscopic examination.  Hemoglobin A1c     Status: None   Collection Time: 07/01/19  7:23 AM  Result Value  Ref Range   Hgb A1c MFr Bld 5.6 4.8 - 5.6 %    Comment:          Prediabetes: 5.7 - 6.4          Diabetes: >6.4          Glycemic control for adults with diabetes: <7.0    Est. average glucose Bld gHb Est-mCnc 114 mg/dL  Vitamin B12     Status: None   Collection Time: 07/01/19  7:23 AM  Result Value Ref Range   Vitamin B-12 912 232 - 1,245 pg/mL  VITAMIN D 25 Hydroxy (Vit-D Deficiency, Fractures)     Status: Abnormal   Collection Time: 07/01/19  7:23 AM  Result Value Ref Range   Vit D, 25-Hydroxy 25.1 (L) 30.0 - 100.0 ng/mL    Comment: Vitamin D deficiency has been defined by the [place] and an Endocrine Society practice guideline as a level of serum 25-OH vitamin D less than 20 ng/mL (1,2). The Endocrine Society went on to further define vitamin D insufficiency as a level between 21 and 29 ng/mL (2). 1. IOM (Institute of Medicine). 2010. Dietary reference    intakes for calcium and D. [place]: The    [employer]. 2. Holick MF, [place], Bischoff-Ferrari HA, et al.    Evaluation, treatment, and prevention of vitamin D    deficiency: an Endocrine Society clinical practice    guideline. JCEM. 2011 Jul; 96(7):1911-30.   TSH     Status: None    Collection Time: 07/01/19  7:23 AM  Result Value Ref Range   TSH 1.080 0.450 - 4.500 uIU/mL  CBC with Differential/Platelet     Status: None   Collection Time: 07/31/19 10:40 AM  Result Value Ref Range   WBC 6.1 3.4 - 10.8 x10E3/uL   RBC 4.80 3.77 - 5.28 x10E6/uL   Hemoglobin 14.1 11.1 - 15.9 g/dL   Hematocrit 43.3 34.0 - 46.6 %   MCV 90 79 - 97 fL   MCH 29.4 26.6 - 33.0 pg   MCHC 32.6 31.5 - 35.7 g/dL   RDW 13.2 11.7 - 15.4 %   Platelets 183 150 - 450 x10E3/uL   Neutrophils 59 Not Estab. %   Lymphs 28 Not Estab. %   Monocytes 10 Not Estab. %   Eos 2 Not Estab. %   Basos 1 Not Estab. %   Neutrophils Absolute 3.6 1.4 - 7.0 x10E3/uL   Lymphocytes Absolute 1.7 0.7 - 3.1 x10E3/uL   Monocytes Absolute 0.6 0.1 - 0.9 x10E3/uL   EOS (ABSOLUTE) 0.1 0.0 - 0.4 x10E3/uL   Basophils Absolute 0.0 0.0 - 0.2 x10E3/uL   Immature Granulocytes 0 Not Estab. %   Immature Grans (Abs) 0.0 0.0 - 0.1 x10E3/uL  Potassium     Status: None   Collection Time: 07/31/19 10:40 AM  Result Value Ref Range   Potassium 3.9 3.5 - 5.2 mmol/L  Magnesium     Status: None   Collection Time: 07/31/19 10:40 AM  Result Value Ref Range   Magnesium 2.1 1.6 - 2.3 mg/dL      PHQ2/9: Depression screen [facility] 2/9 09/09/2019 02/04/2019 11/19/2018 08/08/2018 06/19/2018  Decreased Interest 0 0 0 0 0  Down, Depressed, Hopeless 0 0 0 0 0  PHQ - 2 Score 0 0 0 0 0  Altered sleeping 0 1 1 0 1  Tired, decreased energy 0 1 0 0 1  Change in appetite 0 0 1 0 0  Feeling bad or failure about yourself  0 0 0 0 0  Trouble concentrating 0 0 0 0 0  Moving slowly or fidgety/restless 0 0 0 0 0  Suicidal thoughts 0 0 0 0 0  PHQ-9 Score 0 2 2 0 2  Difficult doing work/chores Not difficult at all Not difficult at all Not difficult at all Not difficult at all Not difficult at all  Some recent data might be hidden    phq 9 is negative   Fall Risk: Fall Risk  09/09/2019 02/04/2019 08/08/2018 06/19/2018 02/01/2018  Falls in the past year? 0 0  0 0 No  Number falls in past yr: 0 0 0 - -  Injury with Fall? 0 0 0 - -  Follow up Falls evaluation completed - - - -    Functional Status Survey: Is the patient deaf or have difficulty hearing?: No Does the patient have difficulty seeing, even when wearing glasses/contacts?: No Does the patient have difficulty concentrating, remembering, or making decisions?: No Does the patient have difficulty walking or climbing stairs?: No Does the patient have difficulty dressing or bathing?: No Does the patient have difficulty doing errands alone such as visiting a doctor's office or shopping?: No   Assessment & Plan  1. Morbid obesity due to excess calories (HCC)   - Liraglutide -Weight Management (SAXENDA) 18 MG/3ML SOPN; Inject 0.1-0.5 mLs (0.6-3 mg total) into the skin daily.  Dispense: 45 mL; Refill: 0  2. Vitamin D deficiency  Continue supplementation   3. Benign essential HTN  - hydrochlorothiazide (HYDRODIURIL) 12.5 MG tablet; Take 1 tablet (12.5 mg total) by mouth daily.  Dispense: 90 tablet; Refill: 1 - amLODipine-benazepril (LOTREL) 5-20 MG capsule; Take 1 capsule by mouth daily.  Dispense: 90 capsule; Refill: 1 - potassium chloride (KLOR-CON) 10 MEQ tablet; Take 1 tablet (10 mEq total) by mouth daily.  Dispense: 90 tablet; Refill: 1  4. Insomnia, persistent  - traZODone (DESYREL) 100 MG tablet; Take 1 tablet (100 mg total) by mouth at bedtime.  Dispense: 90 tablet; Refill: 1  5. Dyslipidemia  - atorvastatin (LIPITOR) 40 MG tablet; Take 1 tablet (40 mg total) by mouth daily.  Dispense: 90 tablet; Refill: 1  6. Obstructive apnea   7. Dysmetabolic syndrome  - Liraglutide -Weight Management (SAXENDA) 18 MG/3ML SOPN; Inject 0.1-0.5 mLs (0.6-3 mg total) into the skin daily.  Dispense: 45 mL; Refill: 0  8. Mild Major Recurrent major depressive disorder ([place])  She states did not answer phq 9 correctly , she states she is okay, does not want medication   9. Thrombocytopenia  ([place])  Resolved, last platelets back to normal

## 2019-09-11 ENCOUNTER — Other Ambulatory Visit: Payer: Self-pay

## 2019-09-11 MED ORDER — GLUCOSE BLOOD VI STRP: ORAL_STRIP | 3 refills | Status: DC

## 2019-09-11 MED ORDER — LANCETS MISC: 1.0000 | Freq: Every day | 3 refills | Status: DC

## 2019-09-12 ENCOUNTER — Encounter: Payer: Self-pay | Admitting: Family Medicine

## 2019-09-13 ENCOUNTER — Telehealth: Payer: Self-pay | Admitting: Family Medicine

## 2019-09-13 NOTE — Telephone Encounter (Signed)
Copied from [place] [phone] [id number]. Topic: General - Other >> Sep 13, 2019  3:37 PM Keene Breath wrote: Reason for CRM: Called to request a PA for patient's medication, Liraglutide -Weight Management (SAXENDA) 18 MG/3ML SOPN, Ref# [id number].  Please call to discuss at [phone]

## 2019-11-14 ENCOUNTER — Other Ambulatory Visit: Payer: Self-pay | Admitting: Family Medicine

## 2019-11-14 DIAGNOSIS — E8881 Metabolic syndrome: Secondary | ICD-10-CM

## 2019-12-02 ENCOUNTER — Encounter: Payer: Self-pay | Admitting: Family Medicine

## 2019-12-02 ENCOUNTER — Ambulatory Visit: Payer: 59 | Admitting: Family Medicine

## 2019-12-02 ENCOUNTER — Other Ambulatory Visit: Payer: Self-pay

## 2019-12-02 VITALS — BP 126/76 | HR 87 | Temp 98.2°F | Resp 18 | Ht 67.0 in | Wt 340.0 lb

## 2019-12-02 DIAGNOSIS — E785 Hyperlipidemia, unspecified: Secondary | ICD-10-CM | POA: Diagnosis not present

## 2019-12-02 DIAGNOSIS — I10 Essential (primary) hypertension: Secondary | ICD-10-CM

## 2019-12-02 DIAGNOSIS — E8881 Metabolic syndrome: Secondary | ICD-10-CM | POA: Diagnosis not present

## 2019-12-02 DIAGNOSIS — L309 Dermatitis, unspecified: Secondary | ICD-10-CM

## 2019-12-02 DIAGNOSIS — E559 Vitamin D deficiency, unspecified: Secondary | ICD-10-CM | POA: Diagnosis not present

## 2019-12-02 DIAGNOSIS — G4700 Insomnia, unspecified: Secondary | ICD-10-CM | POA: Diagnosis not present

## 2019-12-02 DIAGNOSIS — G4733 Obstructive sleep apnea (adult) (pediatric): Secondary | ICD-10-CM

## 2019-12-02 DIAGNOSIS — I447 Left bundle-branch block, unspecified: Secondary | ICD-10-CM

## 2019-12-02 MED ORDER — TRAZODONE HCL 100 MG PO TABS: 100.0000 mg | ORAL_TABLET | Freq: Every day | ORAL | 1 refills | Status: DC

## 2019-12-02 MED ORDER — PHENTERMINE-TOPIRAMATE ER 3.75-23 MG PO CP24: 1.0000 | ORAL_CAPSULE | Freq: Every day | ORAL | 0 refills | Status: DC

## 2019-12-02 MED ORDER — HYDROCHLOROTHIAZIDE 12.5 MG PO TABS: 12.5000 mg | ORAL_TABLET | Freq: Every day | ORAL | 1 refills | Status: DC

## 2019-12-02 MED ORDER — ATORVASTATIN CALCIUM 40 MG PO TABS: 40.0000 mg | ORAL_TABLET | Freq: Every day | ORAL | 1 refills | Status: DC

## 2019-12-02 MED ORDER — DESONIDE 0.05 % EX CREA: TOPICAL_CREAM | Freq: Two times a day (BID) | CUTANEOUS | 0 refills | Status: AC

## 2019-12-02 MED ORDER — QSYMIA 7.5-46 MG PO CP24: 1.0000 | ORAL_CAPSULE | Freq: Every day | ORAL | 0 refills | Status: DC

## 2019-12-02 MED ORDER — POTASSIUM CHLORIDE CRYS ER 10 MEQ PO TBCR: 10.0000 meq | EXTENDED_RELEASE_TABLET | Freq: Every day | ORAL | 1 refills | Status: DC

## 2019-12-02 MED ORDER — AMLODIPINE BESY-BENAZEPRIL HCL 5-20 MG PO CAPS: 1.0000 | ORAL_CAPSULE | Freq: Every day | ORAL | 1 refills | Status: DC

## 2019-12-02 NOTE — Progress Notes (Signed)
Name: Emma Oconnor   MRN: [id number]    DOB: January 14, 1956   Date:12/02/2019       Progress Note  Subjective  Chief Complaint  Chief Complaint  Patient presents with  . Follow-up  . Hypertension    HPI  HTN: taking bpmedication Lotrel 5/20 at night , hydrochlorothiazide 12.5 mg in am . She denies side effects, no dizziness, headaches, palpitation  or chest painShe states bp at home has been at goal   Paresthesia: she states feels intermittent itching or buzzing sensation on the left lateral chest / breast, going on for the past few months, it happens intermittently about once a week or so, lasts only a few seconds and resolves when she shifts positions. Not associated with sob, chest pain or diaphoresis.   Pre-diabetes: hgbA1C was 5.7% but last time it was down to 5.6%, she is currently on Saxenda but we will stop since not working for weight loss and made her feel hungrier than usual and unable to drink fluids. . She  denies polydipsia, polyuria or polyphagia.  Hyperlipidemia: taking Atorvastatin, last LDL was elevated, she states she was not taking medication at the time the labs was done, she states it was causing hip pain. She is currently taking it every other day and we will recheck labs today   Insomnia: Trazodone helps her fallasleep, but not able to stay asleep, sleeps straight for about 4.5 hours, wakes up for about 30 minutes and able to fall back asleep, she is feeling rested when she gets up. Taking care of aging parents. Also raising her daughter's kids  OSA: wearing CPAP every night  Obesity: we started her on Contrave mid August 2020, she states the day she started the medication her weight was 339 lbs, she states it was  curbing her appetite , she stopped medication Dec 2020 because insurance stopped covering it. We tried her on Saxenda May 2021, she states not working for her, she states has increase in appetite and not drinking enough fluids since started on the  medication. She did not lose any weight. We will stop medication since ineffective. 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   Ganglion cyst on right foot: going on for the past few months, no pain or discomfort, bump on lateral right foot  LBBB: she does not have any problems at this time and not interested on going to cardiologist at this time   Patient Active Problem List   Diagnosis Date Noted  . Benign neoplasm of ascending colon   . Benign neoplasm of sigmoid colon   . Benign essential HTN 10/25/2014  . Bursitis of shoulder 10/25/2014  . Insomnia, persistent 10/25/2014  . Dyslipidemia 10/25/2014  . Female stress incontinence 10/25/2014  . Eczema intertrigo 10/25/2014  . Menopause 10/25/2014  . Dysmetabolic syndrome 40/98/1191  . Extreme obesity 10/25/2014  . Obstructive apnea 10/25/2014  . Vitamin D deficiency 10/25/2014  . Skin tag 10/25/2014  . Knee pain 10/25/2014    Past Surgical History:  Procedure Laterality Date  . COLONOSCOPY WITH PROPOFOL N/A 02/17/2015   Procedure: COLONOSCOPY WITH PROPOFOL;  Surgeon: Lucilla Lame, MD;  Location: [facility] ENDOSCOPY;  Service: Endoscopy;  Laterality: N/A;  . TUBAL LIGATION      Family History  Problem Relation Age of Onset  . Diabetes Mother   . Hypertension Mother   . Hyperlipidemia Mother   . Multiple sclerosis Brother        July 20, 2010 passed away  . Diabetes Brother   . Breast cancer Neg Hx     Social History   Tobacco Use  . Smoking status: Former Smoker    Packs/day: 0.50    Years: 30.00    Pack years: 15.00    Types: Cigarettes    Start date: 04/19/1975    Quit date: 12/17/2005    Years since quitting: 13.9  . Smokeless tobacco: Never  Used  Substance Use Topics  . Alcohol use: No    Alcohol/week: 0.0 standard drinks     Current Outpatient Medications:  .  acetaminophen (TYLENOL) 500 MG tablet, Take 1 tablet (500 mg total) by mouth every 8 (eight) hours as needed., Disp: 90 tablet, Rfl: 0 .  amLODipine-benazepril (LOTREL) 5-20 MG capsule, Take 1 capsule by mouth daily., Disp: 90 capsule, Rfl: 1 .  atorvastatin (LIPITOR) 40 MG tablet, Take 1 tablet (40 mg total) by mouth daily., Disp: 90 tablet, Rfl: 1 .  glucose blood test strip, One daily, Disp: 100 each, Rfl: 3 .  hydrochlorothiazide (HYDRODIURIL) 12.5 MG tablet, Take 1 tablet (12.5 mg total) by mouth daily., Disp: 90 tablet, Rfl: 1 .  Insulin Pen Needle (NOVOFINE) 30G X 8 MM MISC, Inject 10 each into the skin as needed., Disp: 100 each, Rfl: 1 .  Lancets MISC, 1 Device by Does not apply route daily. Daily, Disp: 100 each, Rfl: 3 .  potassium chloride (KLOR-CON) 10 MEQ tablet, Take 1 tablet (10 mEq total) by mouth daily., Disp: 90 tablet, Rfl: 1 .  SAXENDA 18 MG/3ML SOPN, INJECT SUBCUTANEOUSLY 0.6MG  TO 3MG  DAILY AS DIRECTED, Disp: 45 mL, Rfl: 0 .  traZODone (DESYREL) 100 MG tablet, Take 1 tablet (100 mg total) by mouth at bedtime., Disp: 90 tablet, Rfl: 1 .  Vitamin D, Cholecalciferol, 25 MCG (1000 UT) TABS, Take 1 tablet by mouth daily., Disp: 30 tablet, Rfl: 0 .  potassium chloride (KLOR-CON) 10 MEQ tablet, Take 10 mEq by mouth daily., Disp: , Rfl:   No Known Allergies  I personally reviewed active problem list, medication list, allergies, family history, social history, health maintenance with the patient/caregiver today.   ROS  Constitutional: Negative for fever or weight change.  Respiratory: Negative for cough and shortness of breath.   Cardiovascular: Negative for chest pain or palpitations.  Gastrointestinal: Negative for abdominal pain, no bowel changes.  Musculoskeletal: Negative for gait problem or joint swelling.  Skin: Negative for rash.   Neurological: Negative for dizziness or headache.  No other specific complaints in a complete review of systems (except as listed in HPI above).   Objective  Vitals:   12/02/19 0906  BP: 126/76  Pulse: 87  Resp: 18  Temp: 98.2 F (36.8 C)  TempSrc: Oral  SpO2: 97%  Weight: (!) 340 lb (154.2 kg)  Height: 5\' 7"  (1.702 m)    Body mass index is 53.25 kg/m.  Physical Exam  Constitutional: Patient appears well-developed and well-nourished. Obese  No distress.  HEENT: head atraumatic, normocephalic, pupils equal and reactive to light,  neck supple Cardiovascular: Normal rate, regular rhythm and normal heart sounds.  No murmur heard. No BLE edema. Pulmonary/Chest: Effort normal and breath sounds normal. No respiratory distress. Abdominal: Soft.  There is no tenderness. Psychiatric: Patient has a normal mood and affect. behavior is normal. Judgment and thought content normal.  PHQ2/9: Depression screen [facility] 2/9 12/02/2019 09/09/2019 02/04/2019 11/19/2018 08/08/2018  Decreased Interest 0 0 0 0 0  Down, Depressed, Hopeless 0 0 0 0 0  PHQ - 2 Score 0 0 0 0 0  Altered sleeping 0 0 1 1 0  Tired, decreased energy 0 0 1 0 0  Change in appetite 0 0 0 1 0  Feeling bad or failure about yourself  0 0 0 0 0  Trouble concentrating 0 0 0 0 0  Moving slowly or fidgety/restless 0 0 0 0 0  Suicidal thoughts 0 0 0 0 0  PHQ-9 Score 0 0 2 2 0  Difficult doing work/chores - Not difficult at all Not difficult at all Not difficult at all Not difficult at all  Some recent data might be hidden    phq 9 is negative   Fall Risk: Fall Risk  12/02/2019 09/09/2019 02/04/2019 08/08/2018 06/19/2018  Falls in the past year? 0 0 0 0 0  Number falls in past yr: 0 0 0 0 -  Injury with Fall? 0 0 0 0 -  Follow up - Falls evaluation completed - - -     Functional Status Survey: Is the patient deaf or have difficulty hearing?: No Does the patient have difficulty seeing, even when wearing glasses/contacts?:  No Does the patient have difficulty concentrating, remembering, or making decisions?: No Does the patient have difficulty walking or climbing stairs?: No Does the patient have difficulty dressing or bathing?: No Does the patient have difficulty doing errands alone such as visiting a doctor's office or shopping?: No    Assessment & Plan  1. Insomnia, persistent  - traZODone (DESYREL) 100 MG tablet; Take 1 tablet (100 mg total) by mouth at bedtime.  Dispense: 90 tablet; Refill: 1  2. Vitamin D deficiency  - VITAMIN D 25 Hydroxy (Vit-D Deficiency, Fractures)  3. Dyslipidemia  - atorvastatin (LIPITOR) 40 MG tablet; Take 1 tablet (40 mg total) by mouth daily.  Dispense: 90 tablet; Refill: 1 - Lipid panel  4. Dysmetabolic syndrome  - Hemoglobin A1c  5. Benign essential HTN  - hydrochlorothiazide (HYDRODIURIL) 12.5 MG tablet; Take 1 tablet (12.5 mg total) by mouth daily.  Dispense: 90 tablet; Refill: 1 - amLODipine-benazepril (LOTREL) 5-20 MG capsule; Take 1 capsule by mouth daily.  Dispense: 90 capsule; Refill: 1 - potassium chloride (KLOR-CON) 10 MEQ tablet; Take 1 tablet (10 mEq total) by mouth daily.  Dispense: 90 tablet; Refill: 1 - CBC with Differential/Platelet - Comprehensive metabolic panel  6. Obstructive apnea   7. Morbid obesity due to excess calories (HCC)  - Phentermine-Topiramate 3.75-23 MG CP24; Take 1 capsule by mouth daily.  Dispense: 30 capsule; Refill: 0 - Phentermine-Topiramate (QSYMIA) 7.5-46 MG CP24; Take 1 capsule by mouth daily.  Dispense: 30 capsule; Refill: 0  8. LBBB (left bundle branch block)

## 2019-12-03 LAB — CBC WITH DIFFERENTIAL/PLATELET
Basophils Absolute: 0 10*3/uL (ref 0.0–0.2)
Basos: 0 %
EOS (ABSOLUTE): 0.1 10*3/uL (ref 0.0–0.4)
Eos: 1 %
Hematocrit: 43.3 % (ref 34.0–46.6)
Hemoglobin: 14.4 g/dL (ref 11.1–15.9)
Immature Grans (Abs): 0 10*3/uL (ref 0.0–0.1)
Immature Granulocytes: 0 %
Lymphocytes Absolute: 1.4 10*3/uL (ref 0.7–3.1)
Lymphs: 25 %
MCH: 29.8 pg (ref 26.6–33.0)
MCHC: 33.3 g/dL (ref 31.5–35.7)
MCV: 90 fL (ref 79–97)
Monocytes Absolute: 0.6 10*3/uL (ref 0.1–0.9)
Monocytes: 10 %
Neutrophils Absolute: 3.5 10*3/uL (ref 1.4–7.0)
Neutrophils: 64 %
Platelets: 176 10*3/uL (ref 150–450)
RBC: 4.83 x10E6/uL (ref 3.77–5.28)
RDW: 13.5 % (ref 11.7–15.4)
WBC: 5.5 10*3/uL (ref 3.4–10.8)

## 2019-12-03 LAB — COMPREHENSIVE METABOLIC PANEL
ALT: 15 IU/L (ref 0–32)
AST: 15 IU/L (ref 0–40)
Albumin/Globulin Ratio: 1.6 (ref 1.2–2.2)
Albumin: 3.9 g/dL (ref 3.8–4.8)
Alkaline Phosphatase: 121 IU/L (ref 48–121)
BUN/Creatinine Ratio: 14 (ref 12–28)
BUN: 10 mg/dL (ref 8–27)
Bilirubin Total: 0.4 mg/dL (ref 0.0–1.2)
CO2: 26 mmol/L (ref 20–29)
Calcium: 9.2 mg/dL (ref 8.7–10.3)
Chloride: 106 mmol/L (ref 96–106)
Creatinine, Ser: 0.72 mg/dL (ref 0.57–1.00)
GFR calc Af Amer: 104 mL/min/{1.73_m2} (ref >59)
GFR calc non Af Amer: 90 mL/min/{1.73_m2} (ref >59)
Globulin, Total: 2.5 g/dL (ref 1.5–4.5)
Glucose: 88 mg/dL (ref 65–99)
Potassium: 4.2 mmol/L (ref 3.5–5.2)
Sodium: 143 mmol/L (ref 134–144)
Total Protein: 6.4 g/dL (ref 6.0–8.5)

## 2019-12-03 LAB — LIPID PANEL
Chol/HDL Ratio: 2.9 ratio (ref 0.0–4.4)
Cholesterol, Total: 166 mg/dL (ref 100–199)
HDL: 57 mg/dL (ref >39)
LDL Chol Calc (NIH): 96 mg/dL (ref 0–99)
Triglycerides: 70 mg/dL (ref 0–149)
VLDL Cholesterol Cal: 13 mg/dL (ref 5–40)

## 2019-12-03 LAB — HEMOGLOBIN A1C
Est. average glucose Bld gHb Est-mCnc: 114 mg/dL
Hgb A1c MFr Bld: 5.6 % (ref 4.8–5.6)

## 2019-12-03 LAB — VITAMIN D 25 HYDROXY (VIT D DEFICIENCY, FRACTURES): Vit D, 25-Hydroxy: 22.4 ng/mL — ABNORMAL LOW (ref 30.0–100.0)

## 2019-12-04 NOTE — Progress Notes (Signed)
prior authorization submitted via covermymeds.  Waiting response. KeyArman Filter - PA Case ID: [id number] - Rx #: [id number] Need help? Call us at [phone] Status Sent to [facility] 7.5-46MG  er capsules Form [employer] Electronic Prior Authorization Form (2017 NCPDP) Original Claim Info [phone] Drug Requires Prior Authorization

## 2019-12-05 ENCOUNTER — Telehealth: Payer: Self-pay

## 2019-12-05 NOTE — Telephone Encounter (Signed)
Called patient and answered her questions she had about her form.

## 2019-12-05 NOTE — Telephone Encounter (Signed)
Copied from [place] [id number]. Topic: General - Other >> Dec 05, 2019  8:14 AM Celene Kras wrote: Reason for CRM: Pt called and is requesting to speak with nurse regarding paperwork that she brought in. She states that their were questions on the paperwork that she would like to discuss the answers to. Please advise.

## 2019-12-16 ENCOUNTER — Other Ambulatory Visit: Payer: Self-pay

## 2020-04-15 ENCOUNTER — Telehealth: Payer: Self-pay | Admitting: Family Medicine

## 2020-04-15 NOTE — Telephone Encounter (Signed)
Pt was given rx for cpap supplies about a month ago when she was in the office with her mother. Pt needs replacement parts a  face mask, hose and water container. [facility] fax [facility] [phone] and phone number [phone]

## 2020-04-29 ENCOUNTER — Other Ambulatory Visit: Payer: 59

## 2020-04-29 DIAGNOSIS — Z20822 Contact with and (suspected) exposure to covid-19: Secondary | ICD-10-CM

## 2020-05-01 LAB — SARS-COV-2, NAA 2 DAY TAT

## 2020-05-01 LAB — NOVEL CORONAVIRUS, NAA: SARS-CoV-2, NAA: DETECTED — AB

## 2020-05-13 ENCOUNTER — Telehealth: Payer: 59 | Admitting: Family Medicine

## 2020-05-13 ENCOUNTER — Encounter: Payer: Self-pay | Admitting: Family Medicine

## 2020-05-13 DIAGNOSIS — U071 COVID-19: Secondary | ICD-10-CM | POA: Diagnosis not present

## 2020-05-13 DIAGNOSIS — G933 Postviral fatigue syndrome: Secondary | ICD-10-CM

## 2020-05-13 DIAGNOSIS — R059 Cough, unspecified: Secondary | ICD-10-CM | POA: Diagnosis not present

## 2020-05-13 DIAGNOSIS — G9331 Postviral fatigue syndrome: Secondary | ICD-10-CM

## 2020-05-13 MED ORDER — BENZONATATE 100 MG PO CAPS: 100.0000 mg | ORAL_CAPSULE | Freq: Two times a day (BID) | ORAL | 0 refills | Status: DC | PRN

## 2020-05-13 MED ORDER — PROMETHAZINE-DM 6.25-15 MG/5ML PO SYRP: 5.0000 mL | ORAL_SOLUTION | Freq: Four times a day (QID) | ORAL | 0 refills | Status: DC | PRN

## 2020-05-13 NOTE — Progress Notes (Signed)
[name], oplinger are scheduled for a virtual visit with your provider today.    Just as we do with appointments in the office, we must obtain your consent to participate.  Your consent will be active for this visit and any virtual visit you may have with one of our providers in the next 365 days.    If you have a MyChart account, I can also send a copy of this consent to you electronically.  All virtual visits are billed to your insurance company just like a traditional visit in the office.  As this is a virtual visit, video technology does not allow for your provider to perform a traditional examination.  This may limit your provider's ability to fully assess your condition.  If your provider identifies any concerns that need to be evaluated in person or the need to arrange testing such as labs, EKG, etc, we will make arrangements to do so.    Although advances in technology are sophisticated, we cannot ensure that it will always work on either your end or our end.  If the connection with a video visit is poor, we may have to switch to a telephone visit.  With either a video or telephone visit, we are not always able to ensure that we have a secure connection.   I need to obtain your verbal consent now.   Are you willing to proceed with your visit today?   MACYN SHROPSHIRE has provided verbal consent on 05/13/2020 for a virtual visit (video or telephone).   Carla Drape, MD 05/13/2020  6:30 PM   Date:  05/13/2020   ID:  Emma Oconnor, DOB [birth year], MRN [id number]  Patient Location: Home Provider Location: Home Office   Participants: Patient and Provider for Visit and Wrap up  Method of visit: Video  Location of Patient: Home Location of Provider: Home Office Consent was obtain for visit over the video. Services rendered by provider: Visit was performed via video  A video enabled telemedicine application was used and I verified that I am speaking with the correct person using two  identifiers.  PCP:  Steele Sizer, MD   Chief Complaint:  Cough and tiredness  History of Present Illness:    Emma Oconnor is a 65 y.o. female with history as stated below. Presents video telehealth for an acute care visit.  She went to the doctor with her mother today and was told by Dr. Ancil Boozer to get a virtual visit as she could not get in for an in office appointment today.  Patient was diagnosed with COVID on January 13th at [facility].  Overall patient has improved except for cough and fatigue.  She understands these are very normal post COVID symptoms. Patient denies any allergies and thinks she has likely taken a prescription strength cough syrup.  Currently still experiencing sore throat.  Never ran a fever and only had chills for 12 hours. No other aggravating or relieving factors.  No other c/o.  The patient does have symptoms concerning for COVID-19 infection (cough, fatigue) but she is improving from her infection.  Past Medical, Surgical, Social History, Allergies, and Medications have been Reviewed.  Past Medical History:  Diagnosis Date  . Hyperlipidemia   . Hypertension   . Incontinence   . Insomnia   . Menopause   . Metabolic syndrome   . Obesity   . Sleep apnea   . Vitamin D deficiency     Current Meds  Medication Sig  . benzonatate (TESSALON) 100 MG capsule Take 1 capsule (100 mg total) by mouth 2 (two) times daily as needed for cough.  . promethazine-dextromethorphan (PROMETHAZINE-DM) 6.25-15 MG/5ML syrup Take 5 mLs by mouth 4 (four) times daily as needed for cough.     Allergies:   Patient has no known allergies.   Review of Systems  Constitutional: Positive for malaise/fatigue.  Respiratory: Positive for cough.    See HPI for history of present illness.  Physical Exam Constitutional:      Appearance: Normal appearance.  HENT:     Head: Normocephalic and atraumatic.  Pulmonary:     Effort: Pulmonary effort is normal.   Neurological:     Mental Status: She is alert.  Psychiatric:        Mood and Affect: Mood normal.        Behavior: Behavior normal.        Thought Content: Thought content normal.        Judgment: Judgment normal.               There are no diagnoses linked to this encounter. Cough - prescription for promethazine- dextromethorphan cough syrup 6.25mg /15mg  per 77ml with the instructions to take 8ml up 4x/day as needed for cough. Patient was told to avoid driving or going anywhere on this medication as it can make you tired and drowsy. - prescription for tessalon perles 100mg  PO BID PRN cough was sent to pharmacy   Postviral fatigue syndrome - patient was instructed to increase her Vitamin D consumption to 5k IU/day for next month - rest and time will improve this as patient is still recovering from [place] 19 -patient positive less than two weeks ago and is still within time frame for these symptoms to be expected.  Overall she is improving and was told she can increase her Vitamin C consumption daily by 500mg  and start a Zinc supplement of 25mg  daily.  Time:   Today, I have spent 15 minutes with the patient with telehealth technology discussing the above problems, reviewing the chart, previous notes, medications and orders.    Tests Ordered: No orders of the defined types were placed in this encounter.   Medication Changes: Meds ordered this encounter  Medications  . benzonatate (TESSALON) 100 MG capsule    Sig: Take 1 capsule (100 mg total) by mouth 2 (two) times daily as needed for cough.    Dispense:  20 capsule    Refill:  0  . promethazine-dextromethorphan (PROMETHAZINE-DM) 6.25-15 MG/5ML syrup    Sig: Take 5 mLs by mouth 4 (four) times daily as needed for cough.    Dispense:  118 mL    Refill:  0     Disposition:  Follow up with PCP as needed.  If symptoms worsen patient encouraged to seek in person assessment at an Urgent Care.  Signed, Carla Drape, MD   05/13/2020 6:30 PM

## 2020-09-10 ENCOUNTER — Other Ambulatory Visit: Payer: Self-pay | Admitting: Family Medicine

## 2020-09-10 DIAGNOSIS — Z1231 Encounter for screening mammogram for malignant neoplasm of breast: Secondary | ICD-10-CM

## 2020-09-15 NOTE — Progress Notes (Signed)
Name: Emma Oconnor   MRN: [id number]    DOB: 1956/01/24   Date:09/16/2020       Progress Note  Subjective  Chief Complaint  Follow Up  HPI  HTN: taking bpmedication Lotrel 5/20 at night , hydrochlorothiazide 12.5 mg in am . She denies side effects, no dizziness, headaches, palpitation  or chest painBP is at goal   Pre-diabetes: hgbA1C was 5.7% but last time it was down to 5.6%, she is off [place] . She  denies polydipsia, polyuria or polyphagia.She is a stress eater and craves sweets when stressed   Hyperlipidemia: taking Atorvastatin, last LDL went from 169 to 96. She is currently only taking it every other day due to hip pain. We will need to recheck levels next visit   Insomnia: she states not problems falling asleep, but sometimes wakes up due to hip pan. She takes trazodone around 8: 30 to 9 am and it really works for her , but takes it prn only   OSA: wearing CPAP every night, she wakes up feeling rested when she does not have thigh pain   Obesity: we started her on Contrave mid August 2020, she states the day she started the medication her weight was 339 lbs, she states it was  curbing her appetite , she stopped medication Dec 2020 because insurance stopped covering it. We tried her on Saxenda May 2021, she did not noticed any improvement of symptoms it was actually causing her appetite to increase . She did not lose any weight. We sent rx for Qsymia but required PA, but she never received it . She is currently at 250 lbs. She states she is a stress eater and has been more anxious , worried about her parents - they are aging and sick, also taking care of her grandchildren and niece's child - she is the mother figure for them. She still grieves the loss of her daughter   Depression Major:she has a long history of depressiongot worse when she lost her daughter at4 yo from a MVA March 2019and raised 3 of her grandchildren, currently only taking care of her 28 yo niece and 20  yo granddaughter - she moved in on campus [place] to start BS degree. She had therapy for a short period of time, but stopped going, still struggling.   LBBB: she does not have any problems at this time and not interested on going to cardiologist at this time  . Unchanged    Patient Active Problem List   Diagnosis Date Noted  . Benign neoplasm of ascending colon   . Benign neoplasm of sigmoid colon   . Benign essential HTN 10/25/2014  . Bursitis of shoulder 10/25/2014  . Insomnia, persistent 10/25/2014  . Dyslipidemia 10/25/2014  . Female stress incontinence 10/25/2014  . Eczema intertrigo 10/25/2014  . Menopause 10/25/2014  . Dysmetabolic syndrome 00/17/4944  . Extreme obesity 10/25/2014  . Obstructive apnea 10/25/2014  . Vitamin D deficiency 10/25/2014  . Skin tag 10/25/2014  . Knee pain 10/25/2014    Past Surgical History:  Procedure Laterality Date  . COLONOSCOPY WITH PROPOFOL N/A 02/17/2015   Procedure: COLONOSCOPY WITH PROPOFOL;  Surgeon: Lucilla Lame, MD;  Location: [facility] ENDOSCOPY;  Service: Endoscopy;  Laterality: N/A;  . TUBAL LIGATION      Family History  Problem Relation Age of Onset  . Diabetes Mother   . Hypertension Mother   . Hyperlipidemia Mother   . Multiple sclerosis Brother  2012 passed away  . Diabetes Brother   . Breast cancer Neg Hx     Social History   Tobacco Use  . Smoking status: Former Smoker    Packs/day: 0.50    Years: 30.00    Pack years: 15.00    Types: Cigarettes    Start date: 04/19/1975    Quit date: 12/17/2005    Years since quitting: 14.7  . Smokeless tobacco: Never Used  Substance Use Topics  . Alcohol use: No    Alcohol/week: 0.0 standard drinks     Current Outpatient Medications:  .  acetaminophen (TYLENOL) 500 MG tablet, Take 1 tablet (500 mg total) by mouth every 8 (eight) hours as needed., Disp: 90 tablet, Rfl: 0 .  amLODipine-benazepril (LOTREL) 5-20 MG capsule, Take 1 capsule by mouth daily., Disp: 90  capsule, Rfl: 1 .  atorvastatin (LIPITOR) 40 MG tablet, Take 1 tablet (40 mg total) by mouth daily., Disp: 90 tablet, Rfl: 1 .  desonide (DESOWEN) 0.05 % cream, Apply topically 2 (two) times daily., Disp: 30 g, Rfl: 0 .  hydrochlorothiazide (HYDRODIURIL) 12.5 MG tablet, Take 1 tablet (12.5 mg total) by mouth daily., Disp: 90 tablet, Rfl: 1 .  traZODone (DESYREL) 100 MG tablet, Take 1 tablet (100 mg total) by mouth at bedtime., Disp: 90 tablet, Rfl: 1 .  Vitamin D, Cholecalciferol, 25 MCG (1000 UT) TABS, Take 1 tablet by mouth daily., Disp: 30 tablet, Rfl: 0 .  glucose blood test strip, One daily (Patient not taking: Reported on 09/16/2020), Disp: 100 each, Rfl: 3 .  Insulin Pen Needle (NOVOFINE) 30G X 8 MM MISC, Inject 10 each into the skin as needed. (Patient not taking: Reported on 09/16/2020), Disp: 100 each, Rfl: 1 .  Lancets MISC, 1 Device by Does not apply route daily. Daily (Patient not taking: Reported on 09/16/2020), Disp: 100 each, Rfl: 3 .  Phentermine-Topiramate (QSYMIA) 7.5-46 MG CP24, Take 1 capsule by mouth daily. (Patient not taking: Reported on 09/16/2020), Disp: 30 capsule, Rfl: 0 .  Phentermine-Topiramate 3.75-23 MG CP24, Take 1 capsule by mouth daily. (Patient not taking: Reported on 09/16/2020), Disp: 30 capsule, Rfl: 0 .  potassium chloride (KLOR-CON) 10 MEQ tablet, Take 1 tablet (10 mEq total) by mouth daily. (Patient not taking: Reported on 09/16/2020), Disp: 90 tablet, Rfl: 1 .  promethazine-dextromethorphan (PROMETHAZINE-DM) 6.25-15 MG/5ML syrup, Take 5 mLs by mouth 4 (four) times daily as needed for cough. (Patient not taking: Reported on 09/16/2020), Disp: 118 mL, Rfl: 0  No Known Allergies  I personally reviewed active problem list, medication list, allergies, family history, social history, health maintenance with the patient/caregiver today.   ROS  Constitutional: Negative for fever, positive for  weight change.  Respiratory: Negative for cough and shortness of breath.    Cardiovascular: Negative for chest pain or palpitations.  Gastrointestinal: Negative for abdominal pain, no bowel changes.  Musculoskeletal: Negative for gait problem or joint swelling.  Skin: Negative for rash.  Neurological: Negative for dizziness or headache.  No other specific complaints in a complete review of systems (except as listed in HPI above).  Objective  Vitals:   09/16/20 0922  BP: 130/74  Pulse: 85  Resp: 16  Temp: 98.5 F (36.9 C)  TempSrc: Oral  SpO2: 97%  Weight: (!) 350 lb (158.8 kg)  Height: 5\' 7"  (1.702 m)    Body mass index is 54.82 kg/m.  Physical Exam  Constitutional: Patient appears well-developed and well-nourished. Obese  No distress.  HEENT: head atraumatic, normocephalic,  pupils equal and reactive to light, neck supple Cardiovascular: Normal rate, regular rhythm and normal heart sounds.  No murmur heard. No BLE edema. Pulmonary/Chest: Effort normal and breath sounds normal. No respiratory distress. Abdominal: Soft.  There is no tenderness. Psychiatric: Patient has a normal mood and affect. behavior is normal. Judgment and thought content normal.  PHQ2/9: Depression screen [facility] 2/9 09/16/2020 12/02/2019 09/09/2019 02/04/2019 11/19/2018  Decreased Interest 0 0 0 0 0  Down, Depressed, Hopeless 0 0 0 0 0  PHQ - 2 Score 0 0 0 0 0  Altered sleeping 3 0 0 1 1  Tired, decreased energy 1 0 0 1 0  Change in appetite 1 0 0 0 1  Feeling bad or failure about yourself  0 0 0 0 0  Trouble concentrating 0 0 0 0 0  Moving slowly or fidgety/restless 0 0 0 0 0  Suicidal thoughts 0 0 0 0 0  PHQ-9 Score 5 0 0 2 2  Difficult doing work/chores - - Not difficult at all Not difficult at all Not difficult at all  Some recent data might be hidden    phq 9 is positive   Fall Risk: Fall Risk  09/16/2020 12/02/2019 09/09/2019 02/04/2019 08/08/2018  Falls in the past year? 0 0 0 0 0  Number falls in past yr: 0 0 0 0 0  Injury with Fall? 0 0 0 0 0  Follow up - - Falls  evaluation completed - -     Functional Status Survey: Is the patient deaf or have difficulty hearing?: No Does the patient have difficulty seeing, even when wearing glasses/contacts?: No Does the patient have difficulty concentrating, remembering, or making decisions?: No Does the patient have difficulty walking or climbing stairs?: Yes Does the patient have difficulty dressing or bathing?: No Does the patient have difficulty doing errands alone such as visiting a doctor's office or shopping?: No    Assessment & Plan  1. Mild major depression ([place])  We will add Contrave and if needed start SSRI next visit, she will come back sooner if needed   2. Insomnia, persistent  Doing well at this time  3. Dysmetabolic syndrome   4. Dyslipidemia  - atorvastatin (LIPITOR) 40 MG tablet; Take 1 tablet (40 mg total) by mouth daily.  Dispense: 90 tablet; Refill: 1  5. Obstructive apnea  Continue CPAP   6. Benign essential HTN  - amLODipine-benazepril (LOTREL) 5-20 MG capsule; Take 1 capsule by mouth daily.  Dispense: 90 capsule; Refill: 1 - hydrochlorothiazide (HYDRODIURIL) 12.5 MG tablet; Take 1 tablet (12.5 mg total) by mouth daily.  Dispense: 90 tablet; Refill: 1  7. Morbid obesity due to excess calories (HCC)  - Naltrexone-buPROPion HCl ER (CONTRAVE) 8-90 MG TB12; Take 2 tablets by mouth 2 (two) times daily before a meal.  Dispense: 120 tablet; Refill: 2  8. Vitamin D deficiency

## 2020-09-16 ENCOUNTER — Other Ambulatory Visit: Payer: Self-pay

## 2020-09-16 ENCOUNTER — Ambulatory Visit: Payer: 59 | Admitting: Family Medicine

## 2020-09-16 ENCOUNTER — Encounter: Payer: Self-pay | Admitting: Family Medicine

## 2020-09-16 VITALS — BP 130/74 | HR 85 | Temp 98.5°F | Resp 16 | Ht 67.0 in | Wt 350.0 lb

## 2020-09-16 DIAGNOSIS — E8881 Metabolic syndrome: Secondary | ICD-10-CM

## 2020-09-16 DIAGNOSIS — I10 Essential (primary) hypertension: Secondary | ICD-10-CM

## 2020-09-16 DIAGNOSIS — E559 Vitamin D deficiency, unspecified: Secondary | ICD-10-CM

## 2020-09-16 DIAGNOSIS — E785 Hyperlipidemia, unspecified: Secondary | ICD-10-CM

## 2020-09-16 DIAGNOSIS — G4700 Insomnia, unspecified: Secondary | ICD-10-CM

## 2020-09-16 DIAGNOSIS — G4733 Obstructive sleep apnea (adult) (pediatric): Secondary | ICD-10-CM

## 2020-09-16 DIAGNOSIS — F320 Major depressive disorder, single episode, mild: Secondary | ICD-10-CM

## 2020-09-16 MED ORDER — HYDROCHLOROTHIAZIDE 12.5 MG PO TABS: 12.5000 mg | ORAL_TABLET | Freq: Every day | ORAL | 1 refills | Status: DC

## 2020-09-16 MED ORDER — AMLODIPINE BESY-BENAZEPRIL HCL 5-20 MG PO CAPS: 1.0000 | ORAL_CAPSULE | Freq: Every day | ORAL | 1 refills | Status: DC

## 2020-09-16 MED ORDER — CONTRAVE 8-90 MG PO TB12: 2.0000 | ORAL_TABLET | Freq: Two times a day (BID) | ORAL | 2 refills | Status: DC

## 2020-09-16 MED ORDER — ATORVASTATIN CALCIUM 40 MG PO TABS: 40.0000 mg | ORAL_TABLET | Freq: Every day | ORAL | 1 refills | Status: DC

## 2020-09-16 NOTE — Patient Instructions (Signed)
Start 1 tablet every morning for 7 days, then 1 tablet twice daily for 7 days, then 2 tablets every morning and one every evening

## 2020-09-17 ENCOUNTER — Ambulatory Visit
Admission: RE | Admit: 2020-09-17 | Discharge: 2020-09-17 | Disposition: A | Discharge status: Home / Self Care | Payer: 59 | Source: Ambulatory Visit | Attending: Family Medicine | Admitting: Family Medicine

## 2020-09-17 DIAGNOSIS — Z1231 Encounter for screening mammogram for malignant neoplasm of breast: Secondary | ICD-10-CM | POA: Diagnosis not present

## 2020-09-18 ENCOUNTER — Other Ambulatory Visit: Payer: Self-pay | Admitting: Family Medicine

## 2020-09-18 DIAGNOSIS — N6310 Unspecified lump in the right breast, unspecified quadrant: Secondary | ICD-10-CM

## 2020-09-18 DIAGNOSIS — R928 Other abnormal and inconclusive findings on diagnostic imaging of breast: Secondary | ICD-10-CM

## 2020-09-24 ENCOUNTER — Ambulatory Visit
Admission: RE | Admit: 2020-09-24 | Discharge: 2020-09-24 | Disposition: A | Discharge status: Home / Self Care | Payer: 59 | Source: Ambulatory Visit | Attending: Family Medicine | Admitting: Family Medicine

## 2020-09-24 ENCOUNTER — Other Ambulatory Visit: Payer: Self-pay

## 2020-09-24 DIAGNOSIS — R928 Other abnormal and inconclusive findings on diagnostic imaging of breast: Secondary | ICD-10-CM

## 2020-09-24 DIAGNOSIS — N6310 Unspecified lump in the right breast, unspecified quadrant: Secondary | ICD-10-CM

## 2020-12-13 ENCOUNTER — Other Ambulatory Visit: Payer: Self-pay | Admitting: Family Medicine

## 2020-12-13 DIAGNOSIS — G4700 Insomnia, unspecified: Secondary | ICD-10-CM

## 2020-12-13 NOTE — Telephone Encounter (Signed)
Requested Prescriptions  Pending Prescriptions Disp Refills  . traZODone (DESYREL) 100 MG tablet [Pharmacy Med Name: traZODone HCl 100 MG Oral Tablet] 90 tablet 1    Sig: TAKE 1 TABLET BY MOUTH AT  BEDTIME     Psychiatry: Antidepressants - Serotonin Modulator Passed - 12/13/2020 10:12 AM      Passed - Valid encounter within last 6 months    Recent Outpatient Visits          2 months ago Mild major depression [facility])   [facility] Steele Sizer, MD   1 year ago Insomnia, persistent   [facility], Drue Stager, MD   1 year ago Morbid obesity due to excess calories [facility])   [facility] Steele Sizer, MD   1 year ago Well adult exam   [facility] Steele Sizer, MD   2 years ago Morbid obesity due to excess calories [facility])   [facility] Steele Sizer, MD      Future Appointments            In 5 days Steele Sizer, MD [facility], [facility]

## 2020-12-17 NOTE — Progress Notes (Signed)
Name: Emma Oconnor   MRN: [id number]    DOB: 05-03-1955   Date:12/18/2020       Progress Note  Subjective  Chief Complaint  Follow Up  HPI  HTN: taking bp medication Lotrel 5/20 at night , hydrochlorothiazide 12.5 mg in am . She denies side effects. She denies  dizziness, headaches, palpitation  or chest pain BP is at goal   Pre-diabetes: hgbA1C was 5.7 % but last time it was down to 5.6%, she used to take [place] . She  denies polydipsia, polyuria or polyphagia. She has not been craving sweets since she started Contrave    Hyperlipidemia: taking Atorvastatin, last LDL went from 169 to 96. We will recheck labs today    Insomnia: she states not problems falling asleep, but sometimes wakes up due to hip pan. She takes trazodone around 8: 30 to 9 am and it really works for her , she has noticed increase in leg pain at night, tingling/prickly sensation going down her legs, usually the side she is laying on and has to switch positions in bed, we will try Lyrica   OSA: wearing CPAP every night, she wakes up feeling rested when she does not have thigh pain Unchanged    Obesity: we started her on Contrave mid August 2020, she states the day she started the medication her weight was 339 lbs, she states it was  curbing her appetite , she stopped medication Dec 2020 because insurance stopped covering it. We tried her on Saxenda May 2021, she did not noticed any improvement of symptoms it was actually causing her appetite to increase . She did not lose any weight. We sent rx for Qsymia but required PA, but she never received it . She was seen in May 2022 and weight was 250 lbs, we started her on Contrave on full dose for the past 6 weeks it helps her appetite, explained she needs to be down another 8 lbs by next visit.    Depression Major: she has a long history of depression got worse when she lost her daughter at  84 yo from a MVA 07/27/17  and raised 3 of her grandchildren, she is stressed a work,  short staffed but home is better.   LBBB: she does not have any problems at this time and not interested on going to cardiologist at this time  . Only occasionally has a skipped heart beat when stressed   Patient Active Problem List   Diagnosis Date Noted   Benign neoplasm of ascending colon    Benign neoplasm of sigmoid colon    Benign essential HTN 10/25/2014   Bursitis of shoulder 10/25/2014   Insomnia, persistent 10/25/2014   Dyslipidemia 10/25/2014   Female stress incontinence 10/25/2014   Eczema intertrigo 10/25/2014   Menopause 0000000   Dysmetabolic syndrome 0000000   Extreme obesity 10/25/2014   Obstructive apnea 10/25/2014   Vitamin D deficiency 10/25/2014   Skin tag 10/25/2014   Knee pain 10/25/2014    Past Surgical History:  Procedure Laterality Date   COLONOSCOPY WITH PROPOFOL N/A 02/17/2015   Procedure: COLONOSCOPY WITH PROPOFOL;  Surgeon: Lucilla Lame, MD;  Location: [facility] ENDOSCOPY;  Service: Endoscopy;  Laterality: N/A;   TUBAL LIGATION      Family History  Problem Relation Age of Onset   Diabetes Mother    Hypertension Mother    Hyperlipidemia Mother    Multiple sclerosis Brother        2010-07-28 passed away  Diabetes Brother    Breast cancer Neg Hx     Social History   Tobacco Use   Smoking status: Former    Packs/day: 0.50    Years: 30.00    Pack years: 15.00    Types: Cigarettes    Start date: 04/19/1975    Quit date: 12/17/2005    Years since quitting: 15.0   Smokeless tobacco: Never  Substance Use Topics   Alcohol use: No    Alcohol/week: 0.0 standard drinks     Current Outpatient Medications:    acetaminophen (TYLENOL) 500 MG tablet, Take 1 tablet (500 mg total) by mouth every 8 (eight) hours as needed., Disp: 90 tablet, Rfl: 0   amLODipine-benazepril (LOTREL) 5-20 MG capsule, Take 1 capsule by mouth daily., Disp: 90 capsule, Rfl: 1   atorvastatin (LIPITOR) 40 MG tablet, Take 1 tablet (40 mg total) by mouth daily., Disp: 90 tablet, Rfl:  1   desonide (DESOWEN) 0.05 % cream, Apply topically 2 (two) times daily., Disp: 30 g, Rfl: 0   hydrochlorothiazide (HYDRODIURIL) 12.5 MG tablet, Take 1 tablet (12.5 mg total) by mouth daily., Disp: 90 tablet, Rfl: 1   Naltrexone-buPROPion HCl ER (CONTRAVE) 8-90 MG TB12, Take 2 tablets by mouth 2 (two) times daily before a meal., Disp: 120 tablet, Rfl: 2   traZODone (DESYREL) 100 MG tablet, TAKE 1 TABLET BY MOUTH AT  BEDTIME, Disp: 90 tablet, Rfl: 1   Vitamin D, Cholecalciferol, 25 MCG (1000 UT) TABS, Take 1 tablet by mouth daily., Disp: 30 tablet, Rfl: 0  No Known Allergies  I personally reviewed active problem list, medication list, allergies, family history, social history, health maintenance with the patient/caregiver today.   ROS  Constitutional: Negative for fever or significant  weight change.  Respiratory: Negative for cough and shortness of breath.   Cardiovascular: Negative for chest pain or palpitations.  Gastrointestinal: Negative for abdominal pain, no bowel changes.  Musculoskeletal: positive for gait problem but no  joint swelling.  Skin: Negative for rash.  Neurological: Negative for dizziness or headache.  No other specific complaints in a complete review of systems (except as listed in HPI above).   Objective  Vitals:   12/18/20 1417  BP: 130/74  Pulse: 93  Resp: 18  Temp: 98.3 F (36.8 C)  TempSrc: Oral  SpO2: 97%  Weight: (!) 345 lb 4.8 oz (156.6 kg)  Height: '5\' 7"'$  (1.702 m)    Body mass index is 54.08 kg/m.  Physical Exam  Constitutional: Patient appears well-developed and well-nourished. Obese  No distress.  HEENT: head atraumatic, normocephalic, pupils equal and reactive to light, neck supple Cardiovascular: Normal rate, regular rhythm and normal heart sounds.  No murmur heard. No BLE edema. Pulmonary/Chest: Effort normal and breath sounds normal. No respiratory distress. Abdominal: Soft.  There is no tenderness. Psychiatric: Patient has a normal  mood and affect. behavior is normal. Judgment and thought content normal.   PHQ2/9: Depression screen [facility] 2/9 12/18/2020 09/16/2020 12/02/2019 09/09/2019 02/04/2019  Decreased Interest 0 0 0 0 0  Down, Depressed, Hopeless 0 0 0 0 0  PHQ - 2 Score 0 0 0 0 0  Altered sleeping 1 3 0 0 1  Tired, decreased energy 1 1 0 0 1  Change in appetite 0 1 0 0 0  Feeling bad or failure about yourself  0 0 0 0 0  Trouble concentrating 0 0 0 0 0  Moving slowly or fidgety/restless 0 0 0 0 0  Suicidal thoughts 0  0 0 0 0  PHQ-9 Score 2 5 0 0 2  Difficult doing work/chores Not difficult at all - - Not difficult at all Not difficult at all  Some recent data might be hidden    phq 9 is negative   Fall Risk: Fall Risk  12/18/2020 09/16/2020 12/02/2019 09/09/2019 02/04/2019  Falls in the past year? 0 0 0 0 0  Number falls in past yr: 0 0 0 0 0  Injury with Fall? - 0 0 0 0  Follow up - - - Falls evaluation completed -     Functional Status Survey: Is the patient deaf or have difficulty hearing?: No Does the patient have difficulty seeing, even when wearing glasses/contacts?: No Does the patient have difficulty concentrating, remembering, or making decisions?: No Does the patient have difficulty walking or climbing stairs?: Yes Does the patient have difficulty dressing or bathing?: No Does the patient have difficulty doing errands alone such as visiting a doctor's office or shopping?: No    Assessment & Plan  1. Mild major depression ([place])  Stressed at work but overall doing better   2. Insomnia, persistent  Cut down trazodone when starting lyrica  3. Benign essential HTN  - CBC with Differential/Platelet - Comprehensive metabolic panel  4. Dysmetabolic syndrome  - Hemoglobin A1c  5. Obstructive apnea   6. Vitamin D deficiency  - VITAMIN D 25 Hydroxy (Vit-D Deficiency, Fractures)  7. Colon cancer screening  - Ambulatory referral to Gastroenterology  8. Morbid obesity due to excess  calories ([place])  Continue Contrave   9. Dyslipidemia  - Lipid panel  10. Bilateral sciatica  - pregabalin (LYRICA) 50 MG capsule; Take 1-3 capsules (50-150 mg total) by mouth at bedtime.  Dispense: 90 capsule; Refill: 0

## 2020-12-18 ENCOUNTER — Ambulatory Visit: Payer: 59 | Admitting: Family Medicine

## 2020-12-18 ENCOUNTER — Encounter: Payer: Self-pay | Admitting: Family Medicine

## 2020-12-18 ENCOUNTER — Other Ambulatory Visit: Payer: Self-pay

## 2020-12-18 VITALS — BP 130/74 | HR 93 | Temp 98.3°F | Resp 18 | Ht 67.0 in | Wt 345.3 lb

## 2020-12-18 DIAGNOSIS — E559 Vitamin D deficiency, unspecified: Secondary | ICD-10-CM

## 2020-12-18 DIAGNOSIS — E8881 Metabolic syndrome: Secondary | ICD-10-CM | POA: Diagnosis not present

## 2020-12-18 DIAGNOSIS — M5431 Sciatica, right side: Secondary | ICD-10-CM

## 2020-12-18 DIAGNOSIS — F320 Major depressive disorder, single episode, mild: Secondary | ICD-10-CM | POA: Diagnosis not present

## 2020-12-18 DIAGNOSIS — E785 Hyperlipidemia, unspecified: Secondary | ICD-10-CM

## 2020-12-18 DIAGNOSIS — G4700 Insomnia, unspecified: Secondary | ICD-10-CM | POA: Diagnosis not present

## 2020-12-18 DIAGNOSIS — I10 Essential (primary) hypertension: Secondary | ICD-10-CM | POA: Diagnosis not present

## 2020-12-18 DIAGNOSIS — M5432 Sciatica, left side: Secondary | ICD-10-CM

## 2020-12-18 DIAGNOSIS — Z1211 Encounter for screening for malignant neoplasm of colon: Secondary | ICD-10-CM

## 2020-12-18 DIAGNOSIS — G4733 Obstructive sleep apnea (adult) (pediatric): Secondary | ICD-10-CM

## 2020-12-18 MED ORDER — PREGABALIN 50 MG PO CAPS: 50.0000 mg | ORAL_CAPSULE | Freq: Every day | ORAL | 0 refills | Status: DC

## 2020-12-18 NOTE — Patient Instructions (Signed)
OOFOS

## 2020-12-24 ENCOUNTER — Other Ambulatory Visit: Payer: Self-pay

## 2020-12-24 ENCOUNTER — Telehealth: Payer: Self-pay

## 2020-12-24 NOTE — Telephone Encounter (Signed)
Called patient no answer voicemail full so could not leave a message

## 2021-02-08 ENCOUNTER — Encounter: Payer: Self-pay | Admitting: Family Medicine

## 2021-03-01 ENCOUNTER — Other Ambulatory Visit: Payer: Self-pay

## 2021-03-01 DIAGNOSIS — M5431 Sciatica, right side: Secondary | ICD-10-CM

## 2021-03-01 MED ORDER — PREGABALIN 150 MG PO CAPS: 150.0000 mg | ORAL_CAPSULE | Freq: Every day | ORAL | 0 refills | Status: DC

## 2021-03-22 ENCOUNTER — Ambulatory Visit: Payer: 59 | Admitting: Family Medicine

## 2021-05-14 NOTE — Progress Notes (Signed)
Name: Emma Oconnor   MRN: [id number]    DOB: 03/04/1956   Date:05/17/2021       Progress Note  Subjective  Chief Complaint  Annual Exam  HPI  Patient presents for annual CPE and follow up, she is aware of possible additional cost   HTN: taking bp medication Lotrel 5/20 at night , hydrochlorothiazide 12.5 mg in am . She denies side effects. She denies  dizziness, headaches,  chest pain or palpitation. BP has been at goal    Pre-diabetes: hgbA1C was 5.7 % but last time it was down to 5.6% but up again at 5.9 % , she used to take Saxenda . She  denies polydipsia, polyuria or polyphagia. She was taking Contrave and it was curbing her appetite, however her mother died and she has not been taking it over the past few weeks but she will resume it now   Hyperlipidemia: taking Atorvastatin, last LDL went from 169 to 96 but up again at 112. She states not taking Atorvastatin daily , we will try switching to Crestor to see if she can tolerate. She has myalgia.    Insomnia: she states not problems falling asleep, but sometimes wakes up due to hip pan. She takes trazodone around 8: 30 to 9 am and it really works for her ,    Sciatica and right knee pain: she was having a lot of  tingling/prickly sensation going down her legs, usually the side she is laying on and has to switch positions in bed, we added Lyrica and it has helped, however noticing more anterior knee pain, feels unsteady and has been wearing a brace, she states knee pain has been chronic but getting progressively worse and we will refer her to Ortho    OSA: wearing CPAP every night, continue current regiment    Obesity: we started her on Contrave mid August 2020, she states the day she started the medication her weight was 339 lbs, she states it was  curbing her appetite , she stopped medication Dec 2020 because insurance stopped covering it. We tried her on Saxenda May 2021, she did not noticed any improvement of symptoms it was actually  causing her appetite to increase . She did not lose any weight. We sent rx for Qsymia but required PA, and never went throught. She was seen in May 2022 and weight was 250 lbs, we started her on Contrave weigh was down to 245 lbs after 6 weeks, however has been off medication for the past few weeks and weight is back at 350 lbs. She will resume medication today    Depression Major: she has a long history of depression got worse when she lost her daughter at  15 yo from a MVA March 2019  still raising her grandchildren. She is still stressed at work and recently her mother died    LBBB: she does not have any problems at this time and not interested on going to cardiologist at this time  . Only occasionally has a skipped heart beat when stressed. Unchanged    Diet: she usually Peanut butter toast for breakfast, fruit for snacks and dinner at home.  Exercise: she is using a video game to be active, usually just moving the upper body    [place] Visit from 05/17/2021 in [facility]  AUDIT-C Score 0      Depression: Phq 9 is  negative Depression screen [facility] 2/9 05/17/2021 12/18/2020 09/16/2020 12/02/2019 09/09/2019  Decreased  Interest 0 0 0 0 0  Down, Depressed, Hopeless 0 0 0 0 0  PHQ - 2 Score 0 0 0 0 0  Altered sleeping 0 1 3 0 0  Tired, decreased energy 0 1 1 0 0  Change in appetite 0 0 1 0 0  Feeling bad or failure about yourself  0 0 0 0 0  Trouble concentrating 0 0 0 0 0  Moving slowly or fidgety/restless 0 0 0 0 0  Suicidal thoughts 0 0 0 0 0  PHQ-9 Score 0 2 5 0 0  Difficult doing work/chores - Not difficult at all - - Not difficult at all  Some recent data might be hidden   Hypertension: BP Readings from Last 3 Encounters:  05/17/21 134/72  12/18/20 130/74  09/16/20 130/74   Obesity: Wt Readings from Last 3 Encounters:  05/17/21 (!) 350 lb (158.8 kg)  12/18/20 (!) 345 lb 4.8 oz (156.6 kg)  09/16/20 (!) 350 lb (158.8 kg)   BMI Readings from Last 3  Encounters:  05/17/21 54.82 kg/m  12/18/20 54.08 kg/m  09/16/20 54.82 kg/m     Vaccines:   Shingrix: 32-64 yo and ask insurance if covered when patient above 32 yo Pneumonia: educated and discussed with patient. Flu: educated and discussed with patient. COVID-19: discussed with patient today   Hep C Screening: 11/30/12 STD testing and prevention (HIV/chl/gon/syphilis): 03/14/18 Intimate partner violence: negative Sexual History : not currently  Menstrual History/LMP/Abnormal Bleeding: discussed post-menopausal bleeding  Incontinence Symptoms: occasionally stress incontinence , urgency when she gest up in am  Breast cancer:  - Last Mammogram: 09/17/20 - BRCA gene screening: N/A  Osteoporosis: Discussed high calcium and vitamin D supplementation, weight bearing exercises  Cervical cancer screening: Ordered today  Skin cancer: Discussed monitoring for atypical lesions  Colorectal cancer: 02/17/15   Lung cancer: Low Dose CT Chest recommended if Age 54-80 years, 20 pack-year currently smoking OR have quit w/in 15years. Patient does not qualify.   ECG: 02/04/19  Advanced Care Planning: A voluntary discussion about advance care planning including the explanation and discussion of advance directives.  Discussed health care proxy and Living will, and the patient was able to identify a health care proxy as husband.  Patient does not have a living will at present time. If patient does have living will, I have requested they bring this to the clinic to be scanned in to their chart.  Lipids: Lab Results  Component Value Date   CHOL 195 05/14/2021   CHOL 166 12/02/2019   CHOL 251 (H) 07/01/2019   Lab Results  Component Value Date   HDL 60 05/14/2021   HDL 57 12/02/2019   HDL 60 07/01/2019   Lab Results  Component Value Date   LDLCALC 112 (H) 05/14/2021   LDLCALC 96 12/02/2019   LDLCALC 169 (H) 07/01/2019   Lab Results  Component Value Date   TRIG 128 05/14/2021   TRIG 70  12/02/2019   TRIG 123 07/01/2019   Lab Results  Component Value Date   CHOLHDL 3.3 05/14/2021   CHOLHDL 2.9 12/02/2019   CHOLHDL 4.2 07/01/2019   No results found for: LDLDIRECT  Glucose: Glucose  Date Value Ref Range Status  05/14/2021 89 70 - 99 mg/dL Final  12/02/2019 88 65 - 99 mg/dL Final  07/01/2019 89 65 - 99 mg/dL Final    Patient Active Problem List   Diagnosis Date Noted   Benign neoplasm of ascending colon    Benign neoplasm of  sigmoid colon    Benign essential HTN 10/25/2014   Bursitis of shoulder 10/25/2014   Insomnia, persistent 10/25/2014   Dyslipidemia 10/25/2014   Female stress incontinence 10/25/2014   Eczema intertrigo 10/25/2014   Menopause 31/49/7026   Dysmetabolic syndrome 37/85/8850   Extreme obesity 10/25/2014   Obstructive apnea 10/25/2014   Vitamin D deficiency 10/25/2014   Skin tag 10/25/2014   Knee pain 10/25/2014    Past Surgical History:  Procedure Laterality Date   COLONOSCOPY WITH PROPOFOL N/A 02/17/2015   Procedure: COLONOSCOPY WITH PROPOFOL;  Surgeon: Lucilla Lame, MD;  Location: [facility] ENDOSCOPY;  Service: Endoscopy;  Laterality: N/A;   TUBAL LIGATION      Family History  Problem Relation Age of Onset   Diabetes Mother    Hypertension Mother    Hyperlipidemia Mother    Multiple sclerosis Brother        Jun 18, 2010 passed away   Diabetes Brother    Breast cancer Neg Hx     Social History   Socioeconomic History   Marital status: Married    Spouse name: Lennette Bihari   Number of children: 3   Years of education: Not on file   Highest education level: High school graduate  Occupational History   Not on file  Tobacco Use   Smoking status: Former    Packs/day: 0.50    Years: 30.00    Pack years: 15.00    Types: Cigarettes    Start date: 04/19/1975    Quit date: 12/17/2005    Years since quitting: 15.4   Smokeless tobacco: Never  Vaping Use   Vaping Use: Never used  Substance and Sexual Activity   Alcohol use: No    Alcohol/week:  0.0 standard drinks   Drug use: No   Sexual activity: Not Currently    Partners: Male  Other Topics Concern   Not on file  Social History Narrative   Married.   She is taking online classes trying to finish her Psychology degree   She had 3 children, one of her daughter's died 2017-07-16 from [place] and currently raised her 3 children ( two girls and one boy ) and also lost her niece May 2019 and is raising her teenage daughter.    Social Determinants of Health   Financial Resource Strain: Low Risk    Difficulty of Paying Living Expenses: Not hard at all  Food Insecurity: No Food Insecurity   Worried About Charity fundraiser in the Last Year: Never true   [place] in the Last Year: Never true  Transportation Needs: No Transportation Needs   Lack of Transportation (Medical): No   Lack of Transportation (Non-Medical): No  Physical Activity: Insufficiently Active   Days of Exercise per Week: 4 days   Minutes of Exercise per Session: 20 min  Stress: No Stress Concern Present   Feeling of Stress : Only a little  Social Connections: Moderately Integrated   Frequency of Communication with Friends and Family: More than three times a week   Frequency of Social Gatherings with Friends and Family: Once a week   Attends Religious Services: More than 4 times per year   Active Member of [employer] or Organizations: No   Attends Archivist Meetings: Never   Marital Status: Married  Human resources officer Violence: Not At Risk   Fear of Current or Ex-Partner: No   Emotionally Abused: No   Physically Abused: No   Sexually Abused: No     Current  Outpatient Medications:    acetaminophen (TYLENOL) 500 MG tablet, Take 1 tablet (500 mg total) by mouth every 8 (eight) hours as needed., Disp: 90 tablet, Rfl: 0   desonide (DESOWEN) 0.05 % cream, Apply topically 2 (two) times daily., Disp: 30 g, Rfl: 0   Naltrexone-buPROPion HCl ER (CONTRAVE) 8-90 MG TB12, Take 2 tablets by mouth 2 (two) times  daily before a meal., Disp: 120 tablet, Rfl: 2   rosuvastatin (CRESTOR) 20 MG tablet, Take 1 tablet (20 mg total) by mouth daily., Disp: 90 tablet, Rfl: 0   traZODone (DESYREL) 100 MG tablet, TAKE 1 TABLET BY MOUTH AT  BEDTIME, Disp: 90 tablet, Rfl: 1   Vitamin D, Cholecalciferol, 25 MCG (1000 UT) TABS, Take 1 tablet by mouth daily., Disp: 30 tablet, Rfl: 0   amLODipine-benazepril (LOTREL) 5-20 MG capsule, Take 1 capsule by mouth daily., Disp: 90 capsule, Rfl: 1   hydrochlorothiazide (HYDRODIURIL) 12.5 MG tablet, Take 1 tablet (12.5 mg total) by mouth daily., Disp: 90 tablet, Rfl: 1   pregabalin (LYRICA) 150 MG capsule, Take 1 capsule (150 mg total) by mouth at bedtime., Disp: 90 capsule, Rfl: 0  No Known Allergies   ROS  Constitutional: Negative for fever or weight change.  Respiratory: Negative for cough and shortness of breath.   Cardiovascular: Negative for chest pain or palpitations.  Gastrointestinal: Negative for abdominal pain, no bowel changes.  Musculoskeletal: Negative for gait problem or joint swelling.  Skin: Negative for rash.  Neurological: Negative for dizziness or headache.  No other specific complaints in a complete review of systems (except as listed in HPI above).   Objective  Vitals:   05/17/21 0933  BP: 134/72  Pulse: 84  Resp: 16  SpO2: 99%  Weight: (!) 350 lb (158.8 kg)  Height: 5' 7"  (1.702 m)    Body mass index is 54.82 kg/m.  Physical Exam  Constitutional: Patient appears well-developed and well-nourished. No distress.  HENT: Head: Normocephalic and atraumatic. Ears: B TMs ok, no erythema or effusion; Nose: Nose done Mouth/Throat: not done  Eyes: Conjunctivae and EOM are normal. Pupils are equal, round, and reactive to light. No scleral icterus.  Neck: Normal range of motion. Neck supple. No JVD present. No thyromegaly present.  Cardiovascular: Normal rate, regular rhythm and normal heart sounds.  No murmur heard. No BLE edema. Pulmonary/Chest:  Effort normal and breath sounds normal. No respiratory distress. Abdominal: Soft. Bowel sounds are normal, no distension. There is no tenderness. no masses Breast: no lumps or masses, no nipple discharge or rashes FEMALE GENITALIA:  External genitalia normal External urethra normal Vaginal vault normal without discharge or lesions Cervix normal without discharge or lesions Bimanual exam normal without masses RECTAL: not done Musculoskeletal: Normal range of motion, no joint effusions. No gross deformities Neurological: he is alert and oriented to person, place, and time. No cranial nerve deficit. Coordination, balance, strength, speech and gait are normal.  Skin: Skin is warm and dry. No rash noted. No erythema.  Psychiatric: Patient has a normal mood and affect. behavior is normal. Judgment and thought content normal.   Recent Results (from the past 2160 hour(s))  VITAMIN D 25 Hydroxy (Vit-D Deficiency, Fractures)     Status: None   Collection Time: 05/14/21  4:31 PM  Result Value Ref Range   Vit D, 25-Hydroxy 31.0 30.0 - 100.0 ng/mL    Comment: Vitamin D deficiency has been defined by the [place] practice guideline as a level of  serum 25-OH vitamin D less than 20 ng/mL (1,2). The Endocrine Society went on to further define vitamin D insufficiency as a level between 21 and 29 ng/mL (2). 1. IOM (Institute of Medicine). 2010. Dietary reference    intakes for calcium and D. [place]: The    [employer]. 2. Holick MF, Bink[place], Bischoff-Ferrari HA, et al.    Evaluation, treatment, and prevention of vitamin D    deficiency: an Endocrine Society clinical practice    guideline. JCEM. 2011 Jul; 96(7):1911-30.   Lipid panel     Status: Abnormal   Collection Time: 05/14/21  4:31 PM  Result Value Ref Range   Cholesterol, Total 195 100 - 199 mg/dL   Triglycerides 128 0 - 149 mg/dL   HDL 60 >39 mg/dL   VLDL Cholesterol Cal 23 5 -  40 mg/dL   LDL Chol Calc (NIH) 112 (H) 0 - 99 mg/dL   Chol/HDL Ratio 3.3 0.0 - 4.4 ratio    Comment:                                   T. Chol/HDL Ratio                                             Men  Women                               1/2 Avg.Risk  3.4    3.3                                   Avg.Risk  5.0    4.4                                2X Avg.Risk  9.6    7.1                                3X Avg.Risk 23.4   11.0   CBC with Differential/Platelet     Status: None   Collection Time: 05/14/21  4:31 PM  Result Value Ref Range   WBC 7.2 3.4 - 10.8 x10E3/uL   RBC 4.94 3.77 - 5.28 x10E6/uL   Hemoglobin 14.4 11.1 - 15.9 g/dL   Hematocrit 42.8 34.0 - 46.6 %   MCV 87 79 - 97 fL   MCH 29.1 26.6 - 33.0 pg   MCHC 33.6 31.5 - 35.7 g/dL   RDW 14.2 11.7 - 15.4 %   Platelets 205 150 - 450 x10E3/uL   Neutrophils 56 Not Estab. %   Lymphs 33 Not Estab. %   Monocytes 8 Not Estab. %   Eos 2 Not Estab. %   Basos 1 Not Estab. %   Neutrophils Absolute 4.0 1.4 - 7.0 x10E3/uL   Lymphocytes Absolute 2.4 0.7 - 3.1 x10E3/uL   Monocytes Absolute 0.6 0.1 - 0.9 x10E3/uL   EOS (ABSOLUTE) 0.1 0.0 - 0.4 x10E3/uL   Basophils Absolute 0.1 0.0 - 0.2 x10E3/uL   Immature Granulocytes 0 Not Estab. %   Immature Grans (Abs)  0.0 0.0 - 0.1 x10E3/uL  Hemoglobin A1c     Status: Abnormal   Collection Time: 05/14/21  4:31 PM  Result Value Ref Range   Hgb A1c MFr Bld 5.9 (H) 4.8 - 5.6 %    Comment:          Prediabetes: 5.7 - 6.4          Diabetes: >6.4          Glycemic control for adults with diabetes: <7.0    Est. average glucose Bld gHb Est-mCnc 123 mg/dL  Comprehensive metabolic panel     Status: Abnormal   Collection Time: 05/14/21  4:31 PM  Result Value Ref Range   Glucose 89 70 - 99 mg/dL   BUN 8 8 - 27 mg/dL   Creatinine, Ser 0.70 0.57 - 1.00 mg/dL   eGFR 97 >59 mL/min/1.73   BUN/Creatinine Ratio 11 (L) 12 - 28   Sodium 140 134 - 144 mmol/L   Potassium 4.2 3.5 - 5.2 mmol/L   Chloride 102 96 -  106 mmol/L   CO2 22 20 - 29 mmol/L   Calcium 9.5 8.7 - 10.3 mg/dL   Total Protein 7.3 6.0 - 8.5 g/dL   Albumin 4.3 3.8 - 4.8 g/dL   Globulin, Total 3.0 1.5 - 4.5 g/dL   Albumin/Globulin Ratio 1.4 1.2 - 2.2   Bilirubin Total 0.3 0.0 - 1.2 mg/dL   Alkaline Phosphatase 141 (H) 44 - 121 IU/L   AST 22 0 - 40 IU/L   ALT 18 0 - 32 IU/L      Fall Risk: Fall Risk  05/17/2021 12/18/2020 09/16/2020 12/02/2019 09/09/2019  Falls in the past year? 0 0 0 0 0  Number falls in past yr: 0 0 0 0 0  Injury with Fall? 0 - 0 0 0  Risk for fall due to : No Fall Risks - - - -  Follow up Falls prevention discussed - - - Falls evaluation completed     Functional Status Survey: Is the patient deaf or have difficulty hearing?: No Does the patient have difficulty seeing, even when wearing glasses/contacts?: No Does the patient have difficulty concentrating, remembering, or making decisions?: No Does the patient have difficulty walking or climbing stairs?: Yes Does the patient have difficulty dressing or bathing?: No Does the patient have difficulty doing errands alone such as visiting a doctor's office or shopping?: No   Assessment & Plan  1. Well adult exam   2. Cervical cancer screening  - IGP,CtNgTv,Apt HPV  3. Need for shingles vaccine  refused  4. Morbid obesity due to excess calories [facility])  Discussed with the patient the risk posed by an increased BMI. Discussed importance of portion control, calorie counting and at least 150 minutes of physical activity weekly. Avoid sweet beverages and drink more water. Eat at least 6 servings of fruit and vegetables daily    5. Benign essential HTN  - hydrochlorothiazide (HYDRODIURIL) 12.5 MG tablet; Take 1 tablet (12.5 mg total) by mouth daily.  Dispense: 90 tablet; Refill: 1 - amLODipine-benazepril (LOTREL) 5-20 MG capsule; Take 1 capsule by mouth daily.  Dispense: 90 capsule; Refill: 1  6. Bilateral sciatica  - pregabalin (LYRICA) 150 MG capsule;  Take 1 capsule (150 mg total) by mouth at bedtime.  Dispense: 90 capsule; Refill: 0 - Ambulatory referral to Orthopedic Surgery  7. Chronic pain of right knee  - Ambulatory referral to Orthopedic Surgery  8. Dyslipidemia  - rosuvastatin (CRESTOR) 20 MG tablet;  Take 1 tablet (20 mg total) by mouth daily.  Dispense: 90 tablet; Refill: 0    -USPSTF grade A and B recommendations reviewed with patient; age-appropriate recommendations, preventive care, screening tests, etc discussed and encouraged; healthy living encouraged; see AVS for patient education given to patient -Discussed importance of 150 minutes of physical activity weekly, eat two servings of fish weekly, eat one serving of tree nuts ( cashews, pistachios, pecans, almonds.Marland Kitchen) every other day, eat 6 servings of fruit/vegetables daily and drink plenty of water and avoid sweet beverages.

## 2021-05-15 LAB — LIPID PANEL
Chol/HDL Ratio: 3.3 ratio (ref 0.0–4.4)
Cholesterol, Total: 195 mg/dL (ref 100–199)
HDL: 60 mg/dL (ref >39)
LDL Chol Calc (NIH): 112 mg/dL — ABNORMAL HIGH (ref 0–99)
Triglycerides: 128 mg/dL (ref 0–149)
VLDL Cholesterol Cal: 23 mg/dL (ref 5–40)

## 2021-05-15 LAB — COMPREHENSIVE METABOLIC PANEL
ALT: 18 IU/L (ref 0–32)
AST: 22 IU/L (ref 0–40)
Albumin/Globulin Ratio: 1.4 (ref 1.2–2.2)
Albumin: 4.3 g/dL (ref 3.8–4.8)
Alkaline Phosphatase: 141 IU/L — ABNORMAL HIGH (ref 44–121)
BUN/Creatinine Ratio: 11 — ABNORMAL LOW (ref 12–28)
BUN: 8 mg/dL (ref 8–27)
Bilirubin Total: 0.3 mg/dL (ref 0.0–1.2)
CO2: 22 mmol/L (ref 20–29)
Calcium: 9.5 mg/dL (ref 8.7–10.3)
Chloride: 102 mmol/L (ref 96–106)
Creatinine, Ser: 0.7 mg/dL (ref 0.57–1.00)
Globulin, Total: 3 g/dL (ref 1.5–4.5)
Glucose: 89 mg/dL (ref 70–99)
Potassium: 4.2 mmol/L (ref 3.5–5.2)
Sodium: 140 mmol/L (ref 134–144)
Total Protein: 7.3 g/dL (ref 6.0–8.5)
eGFR: 97 mL/min/{1.73_m2} (ref >59)

## 2021-05-15 LAB — CBC WITH DIFFERENTIAL/PLATELET
Basophils Absolute: 0.1 10*3/uL (ref 0.0–0.2)
Basos: 1 %
EOS (ABSOLUTE): 0.1 10*3/uL (ref 0.0–0.4)
Eos: 2 %
Hematocrit: 42.8 % (ref 34.0–46.6)
Hemoglobin: 14.4 g/dL (ref 11.1–15.9)
Immature Grans (Abs): 0 10*3/uL (ref 0.0–0.1)
Immature Granulocytes: 0 %
Lymphocytes Absolute: 2.4 10*3/uL (ref 0.7–3.1)
Lymphs: 33 %
MCH: 29.1 pg (ref 26.6–33.0)
MCHC: 33.6 g/dL (ref 31.5–35.7)
MCV: 87 fL (ref 79–97)
Monocytes Absolute: 0.6 10*3/uL (ref 0.1–0.9)
Monocytes: 8 %
Neutrophils Absolute: 4 10*3/uL (ref 1.4–7.0)
Neutrophils: 56 %
Platelets: 205 10*3/uL (ref 150–450)
RBC: 4.94 x10E6/uL (ref 3.77–5.28)
RDW: 14.2 % (ref 11.7–15.4)
WBC: 7.2 10*3/uL (ref 3.4–10.8)

## 2021-05-15 LAB — VITAMIN D 25 HYDROXY (VIT D DEFICIENCY, FRACTURES): Vit D, 25-Hydroxy: 31 ng/mL (ref 30.0–100.0)

## 2021-05-15 LAB — HEMOGLOBIN A1C
Est. average glucose Bld gHb Est-mCnc: 123 mg/dL
Hgb A1c MFr Bld: 5.9 % — ABNORMAL HIGH (ref 4.8–5.6)

## 2021-05-17 ENCOUNTER — Ambulatory Visit (INDEPENDENT_AMBULATORY_CARE_PROVIDER_SITE_OTHER): Payer: 59 | Admitting: Family Medicine

## 2021-05-17 ENCOUNTER — Encounter: Payer: Self-pay | Admitting: Family Medicine

## 2021-05-17 DIAGNOSIS — E785 Hyperlipidemia, unspecified: Secondary | ICD-10-CM

## 2021-05-17 DIAGNOSIS — Z23 Encounter for immunization: Secondary | ICD-10-CM

## 2021-05-17 DIAGNOSIS — I10 Essential (primary) hypertension: Secondary | ICD-10-CM | POA: Diagnosis not present

## 2021-05-17 DIAGNOSIS — Z0000 Encounter for general adult medical examination without abnormal findings: Secondary | ICD-10-CM

## 2021-05-17 DIAGNOSIS — G8929 Other chronic pain: Secondary | ICD-10-CM

## 2021-05-17 DIAGNOSIS — Z124 Encounter for screening for malignant neoplasm of cervix: Secondary | ICD-10-CM

## 2021-05-17 DIAGNOSIS — M5432 Sciatica, left side: Secondary | ICD-10-CM

## 2021-05-17 DIAGNOSIS — M5431 Sciatica, right side: Secondary | ICD-10-CM | POA: Diagnosis not present

## 2021-05-17 DIAGNOSIS — M25561 Pain in right knee: Secondary | ICD-10-CM

## 2021-05-17 MED ORDER — ROSUVASTATIN CALCIUM 20 MG PO TABS: 20.0000 mg | ORAL_TABLET | Freq: Every day | ORAL | 0 refills | Status: DC

## 2021-05-17 MED ORDER — AMLODIPINE BESY-BENAZEPRIL HCL 5-20 MG PO CAPS: 1.0000 | ORAL_CAPSULE | Freq: Every day | ORAL | 1 refills | Status: DC

## 2021-05-17 MED ORDER — PREGABALIN 150 MG PO CAPS: 150.0000 mg | ORAL_CAPSULE | Freq: Every day | ORAL | 0 refills | Status: DC

## 2021-05-17 MED ORDER — HYDROCHLOROTHIAZIDE 12.5 MG PO TABS: 12.5000 mg | ORAL_TABLET | Freq: Every day | ORAL | 1 refills | Status: DC

## 2021-05-17 NOTE — Patient Instructions (Addendum)
Call insurance about shingrix vaccine and schedule colonoscopy   Preventive Care 64-64 Years Old, Female Preventive care refers to lifestyle choices and visits with your health care provider that can promote health and wellness. Preventive care visits are also called wellness exams. What can I expect for my preventive care visit? Counseling Your health care provider may ask you questions about your: Medical history, including: Past medical problems. Family medical history. Pregnancy history. Current health, including: Menstrual cycle. Method of birth control. Emotional well-being. Home life and relationship well-being. Sexual activity and sexual health. Lifestyle, including: Alcohol, nicotine or tobacco, and drug use. Access to firearms. Diet, exercise, and sleep habits. Work and work Statistician. Sunscreen use. Safety issues such as seatbelt and bike helmet use. Physical exam Your health care provider will check your: Height and weight. These may be used to calculate your BMI (body mass index). BMI is a measurement that tells if you are at a healthy weight. Waist circumference. This measures the distance around your waistline. This measurement also tells if you are at a healthy weight and may help predict your risk of certain diseases, such as type 2 diabetes and high blood pressure. Heart rate and blood pressure. Body temperature. Skin for abnormal spots. What immunizations do I need? Vaccines are usually given at various ages, according to a schedule. Your health care provider will recommend vaccines for you based on your age, medical history, and lifestyle or other factors, such as travel or where you work. What tests do I need? Screening Your health care provider may recommend screening tests for certain conditions. This may include: Lipid and cholesterol levels. Diabetes screening. This is done by checking your blood sugar (glucose) after you have not eaten for a while  (fasting). Pelvic exam and Pap test. Hepatitis B test. Hepatitis C test. HIV (human immunodeficiency virus) test. STI (sexually transmitted infection) testing, if you are at risk. Lung cancer screening. Colorectal cancer screening. Mammogram. Talk with your health care provider about when you should start having regular mammograms. This may depend on whether you have a family history of breast cancer. BRCA-related cancer screening. This may be done if you have a family history of breast, ovarian, tubal, or peritoneal cancers. Bone density scan. This is done to screen for osteoporosis. Talk with your health care provider about your test results, treatment options, and if necessary, the need for more tests. Follow these instructions at home: Eating and drinking  Eat a diet that includes fresh fruits and vegetables, whole grains, lean protein, and low-fat dairy products. Take vitamin and mineral supplements as recommended by your health care provider. Do not drink alcohol if: Your health care provider tells you not to drink. You are pregnant, may be pregnant, or are planning to become pregnant. If you drink alcohol: Limit how much you have to 0-1 drink a day. Know how much alcohol is in your drink. In the [place] one drink equals one 12 oz bottle of beer (355 mL), one 5 oz glass of wine (148 mL), or one 1 oz glass of hard liquor (44 mL). Lifestyle Brush your teeth every morning and night with fluoride toothpaste. Floss one time each day. Exercise for at least 30 minutes 5 or more days each week. Do not use any products that contain nicotine or tobacco. These products include cigarettes, chewing tobacco, and vaping devices, such as e-cigarettes. If you need help quitting, ask your health care provider. Do not use drugs. If you are sexually active, practice safe sex.  Use a condom or other form of protection to prevent STIs. If you do not wish to become pregnant, use a form of birth control. If  you plan to become pregnant, see your health care provider for a prepregnancy visit. Take aspirin only as told by your health care provider. Make sure that you understand how much to take and what form to take. Work with your health care provider to find out whether it is safe and beneficial for you to take aspirin daily. Find healthy ways to manage stress, such as: Meditation, yoga, or listening to music. Journaling. Talking to a trusted person. Spending time with friends and family. Minimize exposure to UV radiation to reduce your risk of skin cancer. Safety Always wear your seat belt while driving or riding in a vehicle. Do not drive: If you have been drinking alcohol. Do not ride with someone who has been drinking. When you are tired or distracted. While texting. If you have been using any mind-altering substances or drugs. Wear a helmet and other protective equipment during sports activities. If you have firearms in your house, make sure you follow all gun safety procedures. Seek help if you have been physically or sexually abused. What's next? Visit your health care provider once a year for an annual wellness visit. Ask your health care provider how often you should have your eyes and teeth checked. Stay up to date on all vaccines. This information is not intended to replace advice given to you by your health care provider. Make sure you discuss any questions you have with your health care provider. Document Revised: 09/30/2020 Document Reviewed: 09/30/2020 Elsevier Patient Education  [place].

## 2021-05-20 LAB — IGP,CTNGTV,APT HPV
Chlamydia, Nuc. Acid Amp: NEGATIVE
Gonococcus, Nuc. Acid Amp: NEGATIVE
HPV Aptima: NEGATIVE
Trich vag by NAA: NEGATIVE

## 2021-05-20 LAB — SPECIMEN STATUS REPORT

## 2021-05-27 ENCOUNTER — Ambulatory Visit: Payer: Self-pay

## 2021-05-27 NOTE — Telephone Encounter (Signed)
Pt had medication question about taking meloxicam and lyrica together. Advised pt she can try taking meloxicam in morning and if causes drowsiness can take in late evening. Pt had no further questions.   Reason for Disposition  Caller has medicine question only, adult not sick, AND triager answers question  Answer Assessment - Initial Assessment Questions 1. NAME of MEDICATION: "What medicine are you calling about?"     Meloxicam and lyrica 2. QUESTION: "What is your question?" (e.g., double dose of medicine, side effect)     Taking them together is ok?  Protocols used: Medication Question Call-A-AH

## 2021-06-23 ENCOUNTER — Other Ambulatory Visit: Payer: Self-pay | Admitting: Family Medicine

## 2021-06-23 DIAGNOSIS — E785 Hyperlipidemia, unspecified: Secondary | ICD-10-CM

## 2021-06-23 DIAGNOSIS — G4700 Insomnia, unspecified: Secondary | ICD-10-CM

## 2021-06-23 DIAGNOSIS — M5431 Sciatica, right side: Secondary | ICD-10-CM

## 2021-08-06 ENCOUNTER — Other Ambulatory Visit: Payer: Self-pay | Admitting: Family Medicine

## 2021-08-06 DIAGNOSIS — G4700 Insomnia, unspecified: Secondary | ICD-10-CM

## 2021-08-06 DIAGNOSIS — M5431 Sciatica, right side: Secondary | ICD-10-CM

## 2021-08-13 NOTE — Progress Notes (Signed)
Name: Emma Oconnor   MRN: [id number]    DOB: 01-27-1956   Date:08/16/2021 ? ?     Progress Note ? ?Subjective ? ?Chief Complaint ? ?Follow Up ? ?HPI ? ?HTN: taking bp medication Lotrel 5/20 at night , hydrochlorothiazide 12.5 mg in am . She denies side effects. She denies  dizziness, headaches,  chest pain or palpitation. BP has been at goal and we will continue current medication  ?  ?Pre-diabetes: hgbA1C was 5.7 % but last time it was down to 5.6% but up again at 5.9 % , she used to take Saxenda . She  denies polydipsia, polyuria or polyphagia. She was taking Contrave and it was curbing her appetite but stopped taking it also , she asked about Keto diet but explain that hard long term, carbohydrate restrictive diet is an option  ?  ?Hyperlipidemia: taking Atorvastatin, last LDL went from 169 to 96 but up again at 112. She has Atorvastatin and also Rosuvastatin, she is finishing old rx of Atorvastatin before switching to Crestor  ?  ?Insomnia: she states not problems falling asleep, but sometimes wakes up due to hip pan. She takes trazodone half pills since started on Lyrica around 8: 30 to 9 am and is able to stay asleep until 3 am, wakes up and is able to fall back asleep, but able to fall back asleep quicker when on higher dose of trazodone  ? ?Sciatica and right knee pain: she was having a lot of  tingling/prickly sensation going down her legs, usually the side she is laying on and has to switch positions in bed, we added Lyrica and it has helped, however noticing more anterior knee pain, feels unsteady and has been wearing a brace, she states knee pain has been chronic but getting progressively worse and we will refer her to Ortho  ?  ?OSA: wearing CPAP every night, continue current regiment  ?  ?Obesity: we started her on Contrave mid August 2020, she states the day she started the medication her weight was 339 lbs, she states it was  curbing her appetite , she stopped medication Dec 2020 because insurance  stopped covering it. We tried her on Saxenda May 2021, she did not noticed any improvement of symptoms it was actually causing her appetite to increase . She did not lose any weight. We sent rx for Qsymia but required PA, and never went throught. She was seen in May 2022 and weight was 250 lbs, we started her on Contrave weigh was down to 245 lbs after 6 weeks, however has been off medication for the past few weeks and weight is back at 350 lbs. She resumed medication for a few weeks and did not respond to weight loss and she stopped taking it again. Discussed Weight Watchers and Intermittent Fasting We will also try Wegovy  ?  ?Depression Major: she has a long history of depression got worse when she lost her daughter at  61 yo from a MVA March 2019  still raising her grandchildren, she lsot her mother end of 2022 , she finished online school and has more free time  ? ?LBBB: she does not have any problems at this time and not interested on going to cardiologist at this time . Unchanged  ? ?OA both knee  she was seen by Ortho - Dr. Sabra Heck - advised to take Meloxicam 15 mg daily, she states pain on right knee is worse , discussed risk of long term meloxicam use .  She states pain has decreased with medication use  ? ?Patient Active Problem List  ? Diagnosis Date Noted  ? Benign neoplasm of ascending colon   ? Benign neoplasm of sigmoid colon   ? Benign essential HTN 10/25/2014  ? Bursitis of shoulder 10/25/2014  ? Insomnia, persistent 10/25/2014  ? Dyslipidemia 10/25/2014  ? Female stress incontinence 10/25/2014  ? Eczema intertrigo 10/25/2014  ? Menopause 10/25/2014  ? Dysmetabolic syndrome 58/12/9831  ? Extreme obesity 10/25/2014  ? Obstructive apnea 10/25/2014  ? Vitamin D deficiency 10/25/2014  ? Skin tag 10/25/2014  ? Knee pain 10/25/2014  ? ? ?Past Surgical History:  ?Procedure Laterality Date  ? COLONOSCOPY WITH PROPOFOL N/A 02/17/2015  ? Procedure: COLONOSCOPY WITH PROPOFOL;  Surgeon: Lucilla Lame, MD;   Location: [facility] ENDOSCOPY;  Service: Endoscopy;  Laterality: N/A;  ? TUBAL LIGATION    ? ? ?Family History  ?Problem Relation Age of Onset  ? Diabetes Mother   ? Hypertension Mother   ? Hyperlipidemia Mother   ? Multiple sclerosis Brother   ?     16-Jul-2010 passed away  ? Diabetes Brother   ? Breast cancer Neg Hx   ? ? ?Social History  ? ?Tobacco Use  ? Smoking status: Former  ?  Packs/day: 0.50  ?  Years: 30.00  ?  Pack years: 15.00  ?  Types: Cigarettes  ?  Start date: 04/19/1975  ?  Quit date: 12/17/2005  ?  Years since quitting: 15.6  ? Smokeless tobacco: Never  ?Substance Use Topics  ? Alcohol use: No  ?  Alcohol/week: 0.0 standard drinks  ? ? ? ?Current Outpatient Medications:  ?  acetaminophen (TYLENOL) 500 MG tablet, Take 1 tablet (500 mg total) by mouth every 8 (eight) hours as needed., Disp: 90 tablet, Rfl: 0 ?  amLODipine-benazepril (LOTREL) 5-20 MG capsule, Take 1 capsule by mouth daily., Disp: 90 capsule, Rfl: 1 ?  desonide (DESOWEN) 0.05 % cream, Apply topically 2 (two) times daily., Disp: 30 g, Rfl: 0 ?  hydrochlorothiazide (HYDRODIURIL) 12.5 MG tablet, Take 1 tablet (12.5 mg total) by mouth daily., Disp: 90 tablet, Rfl: 1 ?  meloxicam (MOBIC) 15 MG tablet, Take 15 mg by mouth daily., Disp: , Rfl:  ?  pregabalin (LYRICA) 150 MG capsule, TAKE 1 CAPSULE BY MOUTH AT  BEDTIME, Disp: 90 capsule, Rfl: 0 ?  rosuvastatin (CRESTOR) 20 MG tablet, Take 1 tablet (20 mg total) by mouth daily., Disp: 90 tablet, Rfl: 0 ?  traZODone (DESYREL) 100 MG tablet, TAKE 1 TABLET BY MOUTH AT  BEDTIME, Disp: 90 tablet, Rfl: 3 ?  Vitamin D, Cholecalciferol, 25 MCG (1000 UT) TABS, Take 1 tablet by mouth daily., Disp: 30 tablet, Rfl: 0 ?  Naltrexone-buPROPion HCl ER (CONTRAVE) 8-90 MG TB12, Take 2 tablets by mouth 2 (two) times daily before a meal. (Patient not taking: Reported on 08/16/2021), Disp: 120 tablet, Rfl: 2 ? ?No Known Allergies ? ?I personally reviewed active problem list, medication list, allergies, family history, social  history, health maintenance with the patient/caregiver today. ? ? ?ROS ? ?Constitutional: Negative for fever or weight change.  ?Respiratory: Negative for cough and shortness of breath.   ?Cardiovascular: Negative for chest pain or palpitations.  ?Gastrointestinal: Negative for abdominal pain, no bowel changes.  ?Musculoskeletal: Negative for gait problem or joint swelling.  ?Skin: Negative for rash.  ?Neurological: Negative for dizziness or headache.  ?No other specific complaints in a complete review of systems (except as listed in HPI above).  ? ?  Objective ? ?Vitals:  ? 08/16/21 1115  ?BP: 132/74  ?Pulse: 94  ?Resp: 16  ?SpO2: 98%  ?Weight: (!) 353 lb (160.1 kg)  ?Height: '5\' 7"'$  (1.702 m)  ? ? ?Body mass index is 55.29 kg/m?. ? ?Physical Exam ? ?Constitutional: Patient appears well-developed and well-nourished. Obese  No distress.  ?HEENT: head atraumatic, normocephalic, pupils equal and reactive to light, neck supple ?Cardiovascular: Normal rate, regular rhythm and normal heart sounds.  No murmur heard. No BLE edema. ?Pulmonary/Chest: Effort normal and breath sounds normal. No respiratory distress. ?Abdominal: Soft.  There is no tenderness. ?Muscular Skeletal: wearing a brace of right knee  ?Psychiatric: Patient has a normal mood and affect. behavior is normal. Judgment and thought content normal.  ? ? ?PHQ2/9: ? ?  08/16/2021  ? 11:14 AM 05/17/2021  ?  9:32 AM 12/18/2020  ?  2:26 PM 09/16/2020  ?  9:21 AM 12/02/2019  ?  9:01 AM  ?Depression screen PHQ 2/9  ?Decreased Interest 0 0 0 0 0  ?Down, Depressed, Hopeless 0 0 0 0 0  ?PHQ - 2 Score 0 0 0 0 0  ?Altered sleeping 1 0 1 3 0  ?Tired, decreased energy 1 0 1 1 0  ?Change in appetite 0 0 0 1 0  ?Feeling bad or failure about yourself  0 0 0 0 0  ?Trouble concentrating 0 0 0 0 0  ?Moving slowly or fidgety/restless 0 0 0 0 0  ?Suicidal thoughts 0 0 0 0 0  ?PHQ-9 Score 2 0 2 5 0  ?Difficult doing work/chores   Not difficult at all    ?  ?phq 9 is negative ? ? ?Fall Risk: ? ?   08/16/2021  ? 11:14 AM 05/17/2021  ?  9:32 AM 12/18/2020  ?  2:26 PM 09/16/2020  ?  9:21 AM 12/02/2019  ?  9:01 AM  ?Fall Risk   ?Falls in the past year? 0 0 0 0 0  ?Number falls in past yr: 0 0 0 0 0  ?Injury with Fall? 0 0

## 2021-08-16 ENCOUNTER — Ambulatory Visit (INDEPENDENT_AMBULATORY_CARE_PROVIDER_SITE_OTHER): Payer: 59 | Admitting: Family Medicine

## 2021-08-16 ENCOUNTER — Encounter: Payer: Self-pay | Admitting: Family Medicine

## 2021-08-16 VITALS — BP 132/74 | HR 94 | Resp 16 | Ht 67.0 in | Wt 353.0 lb

## 2021-08-16 DIAGNOSIS — F325 Major depressive disorder, single episode, in full remission: Secondary | ICD-10-CM

## 2021-08-16 DIAGNOSIS — G4700 Insomnia, unspecified: Secondary | ICD-10-CM

## 2021-08-16 DIAGNOSIS — G4733 Obstructive sleep apnea (adult) (pediatric): Secondary | ICD-10-CM

## 2021-08-16 DIAGNOSIS — E785 Hyperlipidemia, unspecified: Secondary | ICD-10-CM

## 2021-08-16 DIAGNOSIS — I10 Essential (primary) hypertension: Secondary | ICD-10-CM

## 2021-08-16 DIAGNOSIS — E559 Vitamin D deficiency, unspecified: Secondary | ICD-10-CM

## 2021-08-16 DIAGNOSIS — M170 Bilateral primary osteoarthritis of knee: Secondary | ICD-10-CM

## 2021-08-16 DIAGNOSIS — Z6841 Body Mass Index (BMI) 40.0 and over, adult: Secondary | ICD-10-CM

## 2021-08-16 DIAGNOSIS — E8881 Metabolic syndrome: Secondary | ICD-10-CM

## 2021-08-16 MED ORDER — WEGOVY 0.5 MG/0.5ML ~~LOC~~ SOAJ: 0.5000 mg | SUBCUTANEOUS | 1 refills | Status: DC

## 2021-09-04 ENCOUNTER — Encounter: Payer: Self-pay | Admitting: Family Medicine

## 2021-09-06 ENCOUNTER — Other Ambulatory Visit: Payer: Self-pay | Admitting: Family Medicine

## 2021-09-06 MED ORDER — WEGOVY 1.7 MG/0.75ML ~~LOC~~ SOAJ: 1.7000 mg | SUBCUTANEOUS | 0 refills | Status: DC

## 2021-10-07 ENCOUNTER — Other Ambulatory Visit: Payer: Self-pay | Admitting: Family Medicine

## 2021-10-07 DIAGNOSIS — I10 Essential (primary) hypertension: Secondary | ICD-10-CM

## 2021-10-07 NOTE — Telephone Encounter (Signed)
Spoke with pt and informed her that prescription has been sent to pharmacy. She also scheduled her follow up appt for Sept

## 2021-10-20 ENCOUNTER — Other Ambulatory Visit: Payer: Self-pay | Admitting: Family Medicine

## 2021-10-20 DIAGNOSIS — Z1231 Encounter for screening mammogram for malignant neoplasm of breast: Secondary | ICD-10-CM

## 2021-10-22 ENCOUNTER — Telehealth: Payer: Self-pay | Admitting: Family Medicine

## 2021-10-22 NOTE — Telephone Encounter (Signed)
Called patient to let her know we have no samples

## 2021-10-22 NOTE — Telephone Encounter (Signed)
Pt is calling to see if there are any samples for wegovy .50. The pharmacy has been out of stock for 2 weeks.  CB- [phone]

## 2021-11-10 ENCOUNTER — Ambulatory Visit
Admission: RE | Admit: 2021-11-10 | Discharge: 2021-11-10 | Disposition: A | Discharge status: Home / Self Care | Payer: 59 | Source: Ambulatory Visit | Attending: Family Medicine | Admitting: Family Medicine

## 2021-11-10 DIAGNOSIS — Z1231 Encounter for screening mammogram for malignant neoplasm of breast: Secondary | ICD-10-CM | POA: Insufficient documentation

## 2021-11-24 ENCOUNTER — Other Ambulatory Visit: Payer: Self-pay | Admitting: Family Medicine

## 2021-11-24 DIAGNOSIS — E785 Hyperlipidemia, unspecified: Secondary | ICD-10-CM

## 2021-11-24 DIAGNOSIS — M5432 Sciatica, left side: Secondary | ICD-10-CM

## 2021-11-27 ENCOUNTER — Encounter: Payer: Self-pay | Admitting: Family Medicine

## 2021-11-29 ENCOUNTER — Other Ambulatory Visit: Payer: Self-pay | Admitting: Family Medicine

## 2021-12-03 ENCOUNTER — Other Ambulatory Visit: Payer: Self-pay | Admitting: Family Medicine

## 2021-12-03 MED ORDER — WEGOVY 0.5 MG/0.5ML ~~LOC~~ SOAJ: 0.5000 mg | SUBCUTANEOUS | 0 refills | Status: DC

## 2021-12-16 NOTE — Progress Notes (Signed)
Name: Emma Oconnor   MRN: [id number]    DOB: Sep 25, 1955   Date:12/17/2021       Progress Note  Subjective  Chief Complaint  Follow up  HPI  HTN: taking bp medication Lotrel 5/20 at night , hydrochlorothiazide 12.5 mg in am She denies side effects. She denies  dizziness, headaches,  chest pain or palpitation. She has noticed some lower extremity edema lately, but resolves with leg elevation.    Pre-diabetes: hgbA1C was 5.7 % but last time it was down to 5.6% but up again at 5.9 % , she will send me a copy of her labs done at [facility]. She has not been very compliant with her diet recently. She said a lot of birthday parties in July    Hyperlipidemia: taking Atorvastatin, last LDL went from 169 to 96 but up again at 112. She has Atorvastatin and also Rosuvastatin, she is still alternating both medications    Insomnia: she states not problems falling asleep, but sometimes wakes up due to hip pan. She takes trazodone half pills since started on Lyrica around 8: 30 to 9 am and is able to stay asleep until 3 am, wakes up and is able to fall back asleep, but able to fall back asleep quicker when on higher dose of trazodone  Unchanged   Sciatica and right knee pain: she was having a lot of  tingling/prickly sensation going down her legs, we added Lyrica and it has helped,she states now she is doing better and only taking it prn    OSA: wearing CPAP every night, continue current regiment    Obesity: we started her on Contrave mid August 2020, she states the day she started the medication her weight was 339 lbs, she states it was  curbing her appetite , she stopped medication Dec 2020 because insurance stopped covering it. We tried her on Saxenda May 2021, she did not noticed any improvement of symptoms it was actually causing her appetite to increase . She did not lose any weight. We sent rx for Qsymia but required PA, and never went throught. She was seen in May 2022 and weight was 250 lbs, we started  her on Contrave weigh was down to 245 lbs after 6 weeks, however it stopped working, she was given [facility] in May she took two months of medication but has been out since due to [place] shortage of the medication. She lost 5 lbs but is frustrated because she cannot get anymore . We will send rx of 0.25 mg to local pharmacy to fill when back in stock and the 0.5 mg to mail order she wants to go up on the dose slowly. She is doing water aerobics    Depression Major: she has a long history of depression got worse when she lost her daughter at  28 yo from a MVA March 2019  still raising her grandchildren, she lost her mother but is now doing well. Phq 9 negative   LBBB: she does not have any problems at this time and not interested on going to cardiologist at this time . Unchanged   OA both knee  she was seen by Ortho - Dr. Sabra Heck - she states that she notices increase in pain on her knees and outer hips after water aerobics that can last about one day. Resolves with one dose of Aleve    Patient Active Problem List   Diagnosis Date Noted   Benign neoplasm of ascending colon  Benign neoplasm of sigmoid colon    Benign essential HTN 10/25/2014   Bursitis of shoulder 10/25/2014   Insomnia, persistent 10/25/2014   Dyslipidemia 10/25/2014   Female stress incontinence 10/25/2014   Eczema intertrigo 10/25/2014   Menopause 94/17/4081   Dysmetabolic syndrome 44/81/8563   Extreme obesity 10/25/2014   Obstructive apnea 10/25/2014   Vitamin D deficiency 10/25/2014   Skin tag 10/25/2014   Knee pain 10/25/2014    Past Surgical History:  Procedure Laterality Date   COLONOSCOPY WITH PROPOFOL N/A 02/17/2015   Procedure: COLONOSCOPY WITH PROPOFOL;  Surgeon: Lucilla Lame, MD;  Location: [facility] ENDOSCOPY;  Service: Endoscopy;  Laterality: N/A;   TUBAL LIGATION      Family History  Problem Relation Age of Onset   Diabetes Mother    Hypertension Mother    Hyperlipidemia Mother    Multiple sclerosis  Brother        07/01/2010 passed away   Diabetes Brother    Breast cancer Neg Hx     Social History   Tobacco Use   Smoking status: Former    Packs/day: 0.50    Years: 30.00    Total pack years: 15.00    Types: Cigarettes    Start date: 04/19/1975    Quit date: 12/17/2005    Years since quitting: 16.0   Smokeless tobacco: Never  Substance Use Topics   Alcohol use: No    Alcohol/week: 0.0 standard drinks of alcohol     Current Outpatient Medications:    acetaminophen (TYLENOL) 500 MG tablet, Take 1 tablet (500 mg total) by mouth every 8 (eight) hours as needed., Disp: 90 tablet, Rfl: 0   amLODipine-benazepril (LOTREL) 5-20 MG capsule, Take 1 capsule by mouth daily., Disp: 90 capsule, Rfl: 1   desonide (DESOWEN) 0.05 % cream, Apply topically 2 (two) times daily., Disp: 30 g, Rfl: 0   hydrochlorothiazide (HYDRODIURIL) 12.5 MG tablet, TAKE 1 TABLET BY MOUTH DAILY, Disp: 90 tablet, Rfl: 3   meloxicam (MOBIC) 15 MG tablet, Take 15 mg by mouth daily., Disp: , Rfl:    pregabalin (LYRICA) 150 MG capsule, TAKE 1 CAPSULE BY MOUTH AT  BEDTIME, Disp: 90 capsule, Rfl: 0   rosuvastatin (CRESTOR) 20 MG tablet, Take 1 tablet (20 mg total) by mouth daily., Disp: 90 tablet, Rfl: 0   traZODone (DESYREL) 100 MG tablet, TAKE 1 TABLET BY MOUTH AT  BEDTIME, Disp: 90 tablet, Rfl: 3   Vitamin D, Cholecalciferol, 25 MCG (1000 UT) TABS, Take 1 tablet by mouth daily., Disp: 30 tablet, Rfl: 0   Semaglutide-Weight Management (WEGOVY) 0.5 MG/0.5ML SOAJ, Inject 0.5 mg into the skin once a week. (Patient not taking: Reported on 12/17/2021), Disp: 6 mL, Rfl: 0  No Known Allergies  I personally reviewed active problem list, medication list, allergies, family history, social history with the patient/caregiver today.   ROS  Ten systems reviewed and is negative except as mentioned in HPI   Objective  Vitals:   12/17/21 1442  BP: (!) 146/74  Pulse: 97  Resp: 16  Temp: 97.6 F (36.4 C)  TempSrc: Oral  SpO2: 95%   Weight: (!) 347 lb 9.6 oz (157.7 kg)  Height: '5\' 7"'$  (1.702 m)    Body mass index is 54.44 kg/m.  Physical Exam  Constitutional: Patient appears well-developed and well-nourished. Obese  No distress.  HEENT: head atraumatic, normocephalic, pupils equal and reactive to light, neck supple Cardiovascular: Normal rate, regular rhythm and normal heart sounds.  No murmur heard. No BLE edema.  Pulmonary/Chest: Effort normal and breath sounds normal. No respiratory distress. Abdominal: Soft.  There is no tenderness. Psychiatric: Patient has a normal mood and affect. behavior is normal. Judgment and thought content normal.  Muscular skeletal: no effusion or redness    PHQ2/9:    12/17/2021    2:41 PM 08/16/2021   11:14 AM 05/17/2021    9:32 AM 12/18/2020    2:26 PM 09/16/2020    9:21 AM  Depression screen PHQ 2/9  Decreased Interest 0 0 0 0 0  Down, Depressed, Hopeless 0 0 0 0 0  PHQ - 2 Score 0 0 0 0 0  Altered sleeping 0 1 0 1 3  Tired, decreased energy 0 1 0 1 1  Change in appetite 0 0 0 0 1  Feeling bad or failure about yourself  0 0 0 0 0  Trouble concentrating 0 0 0 0 0  Moving slowly or fidgety/restless 0 0 0 0 0  Suicidal thoughts 0 0 0 0 0  PHQ-9 Score 0 2 0 2 5  Difficult doing work/chores Not difficult at all   Not difficult at all     phq 9 is negative   Fall Risk:    12/17/2021    2:41 PM 08/16/2021   11:14 AM 05/17/2021    9:32 AM 12/18/2020    2:26 PM 09/16/2020    9:21 AM  Fall Risk   Falls in the past year? 0 0 0 0 0  Number falls in past yr: 0 0 0 0 0  Injury with Fall? 0 0 0  0  Risk for fall due to : No Fall Risks No Fall Risks No Fall Risks    Follow up Falls prevention discussed;Education provided Falls prevention discussed Falls prevention discussed        Functional Status Survey: Is the patient deaf or have difficulty hearing?: No Does the patient have difficulty seeing, even when wearing glasses/contacts?: No Does the patient have difficulty  concentrating, remembering, or making decisions?: No Does the patient have difficulty walking or climbing stairs?: No Does the patient have difficulty dressing or bathing?: No Does the patient have difficulty doing errands alone such as visiting a doctor's office or shopping?: No    Assessment & Plan  1. Benign essential HTN  - amLODipine-benazepril (LOTREL) 5-20 MG capsule; Take 1 capsule by mouth daily.  Dispense: 90 capsule; Refill: 1  2. Dyslipidemia  She had labs done recently and will send me a copy  3. Primary osteoarthritis of both knees  - diclofenac Sodium (VOLTAREN) 1 % GEL; Apply 4 g topically 4 (four) times daily.  Dispense: 300 g; Refill: 1  4. Dysmetabolic syndrome   5. Vitamin D deficiency  Continue otc supplementation  6. Obstructive apnea on CPAP   Continue compliance  7. Major depression in remission [facility])  Doing well  8. Morbid obesity with BMI of 50.0-59.9, adult [facility])  Discussed with the patient the risk posed by an increased BMI. Discussed importance of portion control, calorie counting and at least 150 minutes of physical activity weekly. Avoid sweet beverages and drink more water. Eat at least 6 servings of fruit and vegetables daily

## 2021-12-17 ENCOUNTER — Encounter: Payer: Self-pay | Admitting: Family Medicine

## 2021-12-17 ENCOUNTER — Ambulatory Visit: Payer: 59 | Admitting: Family Medicine

## 2021-12-17 VITALS — BP 132/76 | HR 97 | Temp 97.6°F | Resp 16 | Ht 67.0 in | Wt 347.6 lb

## 2021-12-17 DIAGNOSIS — I10 Essential (primary) hypertension: Secondary | ICD-10-CM

## 2021-12-17 DIAGNOSIS — G4733 Obstructive sleep apnea (adult) (pediatric): Secondary | ICD-10-CM

## 2021-12-17 DIAGNOSIS — E8881 Metabolic syndrome: Secondary | ICD-10-CM | POA: Diagnosis not present

## 2021-12-17 DIAGNOSIS — F325 Major depressive disorder, single episode, in full remission: Secondary | ICD-10-CM | POA: Insufficient documentation

## 2021-12-17 DIAGNOSIS — E559 Vitamin D deficiency, unspecified: Secondary | ICD-10-CM

## 2021-12-17 DIAGNOSIS — Z6841 Body Mass Index (BMI) 40.0 and over, adult: Secondary | ICD-10-CM

## 2021-12-17 DIAGNOSIS — Z1211 Encounter for screening for malignant neoplasm of colon: Secondary | ICD-10-CM

## 2021-12-17 DIAGNOSIS — M170 Bilateral primary osteoarthritis of knee: Secondary | ICD-10-CM | POA: Diagnosis not present

## 2021-12-17 DIAGNOSIS — E785 Hyperlipidemia, unspecified: Secondary | ICD-10-CM | POA: Diagnosis not present

## 2021-12-17 DIAGNOSIS — Z9989 Dependence on other enabling machines and devices: Secondary | ICD-10-CM

## 2021-12-17 MED ORDER — AMLODIPINE BESY-BENAZEPRIL HCL 5-20 MG PO CAPS: 1.0000 | ORAL_CAPSULE | Freq: Every day | ORAL | 1 refills | Status: DC

## 2021-12-17 MED ORDER — DICLOFENAC SODIUM 1 % EX GEL: 4.0000 g | Freq: Four times a day (QID) | CUTANEOUS | 1 refills | Status: AC

## 2021-12-17 MED ORDER — WEGOVY 0.5 MG/0.5ML ~~LOC~~ SOAJ: 0.5000 mg | SUBCUTANEOUS | 0 refills | Status: DC

## 2021-12-17 MED ORDER — WEGOVY 0.25 MG/0.5ML ~~LOC~~ SOAJ: 0.2500 mg | SUBCUTANEOUS | 0 refills | Status: DC

## 2022-03-22 ENCOUNTER — Encounter: Payer: Self-pay | Admitting: Family Medicine

## 2022-05-23 NOTE — Progress Notes (Signed)
Name: Emma Oconnor   MRN: [id number]    DOB: [birth year]   Date:05/24/2022       Progress Note  Subjective  Chief Complaint  Annual Exam  HPI  Patient presents for annual CPE.  Diet: going to join [employer] , trying to lose weight, taking TIRWER  Exercise: she has an elliptical   Last Eye Exam: up to date  Last Dental Exam: up to date   [place] Office Visit from 05/24/2022 in [facility]  AUDIT-C Score 0      Depression: Phq 9 is  negative    05/24/2022    3:26 PM 12/17/2021    2:41 PM 08/16/2021   11:14 AM 05/17/2021    9:32 AM 12/18/2020    2:26 PM  Depression screen PHQ 2/9  Decreased Interest 0 0 0 0 0  Down, Depressed, Hopeless 0 0 0 0 0  PHQ - 2 Score 0 0 0 0 0  Altered sleeping 1 0 1 0 1  Tired, decreased energy 1 0 1 0 1  Change in appetite 0 0 0 0 0  Feeling bad or failure about yourself  0 0 0 0 0  Trouble concentrating 0 0 0 0 0  Moving slowly or fidgety/restless 0 0 0 0 0  Suicidal thoughts 0 0 0 0 0  PHQ-9 Score 2 0 2 0 2  Difficult doing work/chores Not difficult at all Not difficult at all   Not difficult at all   Hypertension: BP Readings from Last 3 Encounters:  05/24/22 132/70  12/17/21 132/76  08/16/21 132/74   Obesity: Wt Readings from Last 3 Encounters:  05/24/22 (!) 354 lb 6.4 oz (160.8 kg)  12/17/21 (!) 347 lb 9.6 oz (157.7 kg)  08/16/21 (!) 353 lb (160.1 kg)   BMI Readings from Last 3 Encounters:  05/24/22 56.34 kg/m  12/17/21 54.44 kg/m  08/16/21 55.29 kg/m     Vaccines:   RSV: discussed having it at local pharmacy  Tdap: up to date Shingrix: refused  Pneumonia: N/A Flu: N/A COVID-19: refused    Hep C Screening: 11/30/12 STD testing and prevention (HIV/chl/gon/syphilis): 03/14/18 Intimate partner violence: negative screen  Sexual History : not sexually active, married, husband has ED Menstrual History/LMP/Abnormal Bleeding: post-menopausal  Discussed importance of follow up if any  post-menopausal bleeding: yes  Incontinence Symptoms: negative for symptoms   Breast cancer:  - Last Mammogram: 11/10/21 - BRCA gene screening: N/A  Osteoporosis Prevention : Discussed high calcium and vitamin D supplementation, weight bearing exercises Bone density: 06/20/19  Cervical cancer screening: 05/17/21  Skin cancer: Discussed monitoring for atypical lesions  Colorectal cancer: 02/17/15   Lung cancer:  Low Dose CT Chest recommended if Age 33-80 years, 20 pack-year currently smoking OR have quit w/in 15years. Patient does not qualify for screen   ECG: 02/04/19  Advanced Care Planning: A voluntary discussion about advance care planning including the explanation and discussion of advance directives.  Discussed health care proxy and Living will, and the patient was able to identify a health care proxy as husband .  Patient has a living will and power of attorney of health care   Lipids: Lab Results  Component Value Date   CHOL 195 05/14/2021   CHOL 166 12/02/2019   CHOL 251 (H) 07/01/2019   Lab Results  Component Value Date   HDL 60 05/14/2021   HDL 57 12/02/2019   HDL 60 07/01/2019   Lab Results  Component Value  Date   LDLCALC 112 (H) 05/14/2021   LDLCALC 96 12/02/2019   LDLCALC 169 (H) 07/01/2019   Lab Results  Component Value Date   TRIG 128 05/14/2021   TRIG 70 12/02/2019   TRIG 123 07/01/2019   Lab Results  Component Value Date   CHOLHDL 3.3 05/14/2021   CHOLHDL 2.9 12/02/2019   CHOLHDL 4.2 07/01/2019   No results found for: "LDLDIRECT"  Glucose: Glucose  Date Value Ref Range Status  05/14/2021 89 70 - 99 mg/dL Final  12/02/2019 88 65 - 99 mg/dL Final  07/01/2019 89 65 - 99 mg/dL Final    Patient Active Problem List   Diagnosis Date Noted   Primary osteoarthritis of both knees 12/17/2021   Morbid obesity with BMI of 50.0-59.9, adult ([place]) 12/17/2021   Major depression in remission ([place]) 12/17/2021   Benign neoplasm of ascending colon    Benign  neoplasm of sigmoid colon    Benign essential HTN 10/25/2014   Bursitis of shoulder 10/25/2014   Insomnia, persistent 10/25/2014   Dyslipidemia 10/25/2014   Female stress incontinence 10/25/2014   Eczema intertrigo 10/25/2014   Menopause 70/96/2836   Dysmetabolic syndrome 62/94/7654   OSA on CPAP 10/25/2014   Vitamin D deficiency 10/25/2014    Past Surgical History:  Procedure Laterality Date   COLONOSCOPY WITH PROPOFOL N/A 02/17/2015   Procedure: COLONOSCOPY WITH PROPOFOL;  Surgeon: Lucilla Lame, MD;  Location: [facility] ENDOSCOPY;  Service: Endoscopy;  Laterality: N/A;   TUBAL LIGATION      Family History  Problem Relation Age of Onset   Diabetes Mother    Hypertension Mother    Hyperlipidemia Mother    Multiple sclerosis Brother        28-Jul-2010 passed away   Diabetes Brother    Breast cancer Neg Hx     Social History   Socioeconomic History   Marital status: Married    Spouse name: Lennette Bihari   Number of children: 3   Years of education: Not on file   Highest education level: High school graduate  Occupational History   Not on file  Tobacco Use   Smoking status: Former    Packs/day: 0.50    Years: 30.00    Total pack years: 15.00    Types: Cigarettes    Start date: 04/19/1975    Quit date: 12/17/2005    Years since quitting: 16.4   Smokeless tobacco: Never  Vaping Use   Vaping Use: Never used  Substance and Sexual Activity   Alcohol use: No    Alcohol/week: 0.0 standard drinks of alcohol   Drug use: No   Sexual activity: Not Currently    Partners: Male  Other Topics Concern   Not on file  Social History Narrative   Married.   She is taking online classes trying to finish her Psychology degree with forensics    She had 3 children, one of her daughter's died 2017-07-27 from [place] and currently raised her 3 children ( two girls and one boy ) and also lost her niece May 2019 and is raising her teenage daughter.    Social Determinants of Health   Financial Resource Strain:  Low Risk  (05/24/2022)   Overall Financial Resource Strain (CARDIA)    Difficulty of Paying Living Expenses: Not hard at all  Food Insecurity: No Food Insecurity (05/24/2022)   Hunger Vital Sign    Worried About Running Out of Food in the Last Year: Never true    Ran Out of  Food in the Last Year: Never true  Transportation Needs: No Transportation Needs (05/24/2022)   PRAPARE - Hydrologist (Medical): No    Lack of Transportation (Non-Medical): No  Physical Activity: Inactive (05/24/2022)   Exercise Vital Sign    Days of Exercise per Week: 0 days    Minutes of Exercise per Session: 0 min  Stress: No Stress Concern Present (05/24/2022)   [facility]    Feeling of Stress : Only a little  Social Connections: Socially Integrated (05/24/2022)   Social Connection and Isolation Panel [NHANES]    Frequency of Communication with Friends and Family: More than three times a week    Frequency of Social Gatherings with Friends and Family: Once a week    Attends Religious Services: More than 4 times per year    Active Member of [employer] or Organizations: Yes    Attends Archivist Meetings: 1 to 4 times per year    Marital Status: Married  Human resources officer Violence: Not At Risk (05/24/2022)   Humiliation, Afraid, Rape, and Kick questionnaire    Fear of Current or Ex-Partner: No    Emotionally Abused: No    Physically Abused: No    Sexually Abused: No     Current Outpatient Medications:    acetaminophen (TYLENOL) 500 MG tablet, Take 1 tablet (500 mg total) by mouth every 8 (eight) hours as needed., Disp: 90 tablet, Rfl: 0   amLODipine-benazepril (LOTREL) 5-20 MG capsule, Take 1 capsule by mouth daily., Disp: 90 capsule, Rfl: 1   desonide (DESOWEN) 0.05 % cream, Apply topically 2 (two) times daily., Disp: 30 g, Rfl: 0   diclofenac Sodium (VOLTAREN) 1 % GEL, Apply 4 g topically 4 (four) times daily., Disp: 300  g, Rfl: 1   hydrochlorothiazide (HYDRODIURIL) 12.5 MG tablet, TAKE 1 TABLET BY MOUTH DAILY, Disp: 90 tablet, Rfl: 3   pregabalin (LYRICA) 150 MG capsule, TAKE 1 CAPSULE BY MOUTH AT  BEDTIME, Disp: 90 capsule, Rfl: 0   rosuvastatin (CRESTOR) 20 MG tablet, Take 1 tablet (20 mg total) by mouth daily., Disp: 90 tablet, Rfl: 0   Semaglutide-Weight Management (WEGOVY) 0.5 MG/0.5ML SOAJ, Inject 0.5 mg into the skin once a week., Disp: 6 mL, Rfl: 0   traZODone (DESYREL) 100 MG tablet, TAKE 1 TABLET BY MOUTH AT  BEDTIME, Disp: 90 tablet, Rfl: 3   Vitamin D, Cholecalciferol, 25 MCG (1000 UT) TABS, Take 1 tablet by mouth daily., Disp: 30 tablet, Rfl: 0  No Known Allergies   ROS  Constitutional: Negative for fever or weight change.  Respiratory: Negative for cough and shortness of breath.   Cardiovascular: Negative for chest pain or palpitations.  Gastrointestinal: Negative for abdominal pain, no bowel changes.  Musculoskeletal: Negative for gait problem or joint swelling.  Skin: Negative for rash.  Neurological: Negative for dizziness or headache.  No other specific complaints in a complete review of systems (except as listed in HPI above).   Objective  Vitals:   05/24/22 1522  BP: 132/70  Pulse: 86  Resp: 18  Temp: 98.9 F (37.2 C)  TempSrc: Oral  SpO2: 94%  Weight: (!) 354 lb 6.4 oz (160.8 kg)  Height: 5' 6.5" (1.689 m)    Body mass index is 56.34 kg/m.  Physical Exam  Constitutional: Patient appears well-developed and well-nourished. No distress.  HENT: Head: Normocephalic and atraumatic. Ears: B TMs ok, no erythema or effusion; Nose: Nose normal. Mouth/Throat:  Oropharynx is clear and moist. No oropharyngeal exudate.  Eyes: Conjunctivae and EOM are normal. Pupils are equal, round, and reactive to light. No scleral icterus.  Neck: Normal range of motion. Neck supple. No JVD present. No thyromegaly present.  Cardiovascular: Normal rate, regular rhythm and normal heart sounds.  No  murmur heard. No BLE edema. Pulmonary/Chest: Effort normal and breath sounds normal. No respiratory distress. Abdominal: Soft. Bowel sounds are normal, no distension. There is no tenderness. no masses Breast: no lumps or masses, no nipple discharge or rashes FEMALE GENITALIA:  Not done  RECTAL: not done  Musculoskeletal: Normal range of motion, no joint effusions. No gross deformities Neurological: he is alert and oriented to person, place, and time. No cranial nerve deficit. Coordination, balance, strength, speech and gait are normal.  Skin: Skin is warm and dry. No rash noted. No erythema.  Psychiatric: Patient has a normal mood and affect. behavior is normal. Judgment and thought content normal.   Fall Risk:    05/24/2022    3:25 PM 12/17/2021    2:41 PM 08/16/2021   11:14 AM 05/17/2021    9:32 AM 12/18/2020    2:26 PM  Fall Risk   Falls in the past year? 0 0 0 0 0  Number falls in past yr:  0 0 0 0  Injury with Fall?  0 0 0   Risk for fall due to : No Fall Risks No Fall Risks No Fall Risks No Fall Risks   Follow up Falls prevention discussed;Education provided;Falls evaluation completed Falls prevention discussed;Education provided Falls prevention discussed Falls prevention discussed      Functional Status Survey: Is the patient deaf or have difficulty hearing?: No Does the patient have difficulty seeing, even when wearing glasses/contacts?: No Does the patient have difficulty concentrating, remembering, or making decisions?: No Does the patient have difficulty walking or climbing stairs?: Yes Does the patient have difficulty dressing or bathing?: No Does the patient have difficulty doing errands alone such as visiting a doctor's office or shopping?: No   Assessment & Plan  1. Well adult exam  - Ambulatory referral to Gastroenterology - Lipid panel - CBC with Differential/Platelet - Comprehensive metabolic panel - Hemoglobin A1c - VITAMIN D 25 Hydroxy (Vit-D Deficiency,  Fractures) - B12 and Folate Panel - TSH  2. Colon cancer screening  - Ambulatory referral to Gastroenterology  3. Osteoporosis screening  - DG Bone Density; Future    -USPSTF grade A and B recommendations reviewed with patient; age-appropriate recommendations, preventive care, screening tests, etc discussed and encouraged; healthy living encouraged; see AVS for patient education given to patient -Discussed importance of 150 minutes of physical activity weekly, eat two servings of fish weekly, eat one serving of tree nuts ( cashews, pistachios, pecans, almonds.Marland Kitchen) every other day, eat 6 servings of fruit/vegetables daily and drink plenty of water and avoid sweet beverages.   -Reviewed Health Maintenance: Yes.

## 2022-05-23 NOTE — Patient Instructions (Signed)
Preventive Care 40-64 Years Old, Female Preventive care refers to lifestyle choices and visits with your health care provider that can promote health and wellness. Preventive care visits are also called wellness exams. What can I expect for my preventive care visit? Counseling Your health care provider may ask you questions about your: Medical history, including: Past medical problems. Family medical history. Pregnancy history. Current health, including: Menstrual cycle. Method of birth control. Emotional well-being. Home life and relationship well-being. Sexual activity and sexual health. Lifestyle, including: Alcohol, nicotine or tobacco, and drug use. Access to firearms. Diet, exercise, and sleep habits. Work and work environment. Sunscreen use. Safety issues such as seatbelt and bike helmet use. Physical exam Your health care provider will check your: Height and weight. These may be used to calculate your BMI (body mass index). BMI is a measurement that tells if you are at a healthy weight. Waist circumference. This measures the distance around your waistline. This measurement also tells if you are at a healthy weight and may help predict your risk of certain diseases, such as type 2 diabetes and high blood pressure. Heart rate and blood pressure. Body temperature. Skin for abnormal spots. What immunizations do I need?  Vaccines are usually given at various ages, according to a schedule. Your health care provider will recommend vaccines for you based on your age, medical history, and lifestyle or other factors, such as travel or where you work. What tests do I need? Screening Your health care provider may recommend screening tests for certain conditions. This may include: Lipid and cholesterol levels. Diabetes screening. This is done by checking your blood sugar (glucose) after you have not eaten for a while (fasting). Pelvic exam and Pap test. Hepatitis B test. Hepatitis C  test. HIV (human immunodeficiency virus) test. STI (sexually transmitted infection) testing, if you are at risk. Lung cancer screening. Colorectal cancer screening. Mammogram. Talk with your health care provider about when you should start having regular mammograms. This may depend on whether you have a family history of breast cancer. BRCA-related cancer screening. This may be done if you have a family history of breast, ovarian, tubal, or peritoneal cancers. Bone density scan. This is done to screen for osteoporosis. Talk with your health care provider about your test results, treatment options, and if necessary, the need for more tests. Follow these instructions at home: Eating and drinking  Eat a diet that includes fresh fruits and vegetables, whole grains, lean protein, and low-fat dairy products. Take vitamin and mineral supplements as recommended by your health care provider. Do not drink alcohol if: Your health care provider tells you not to drink. You are pregnant, may be pregnant, or are planning to become pregnant. If you drink alcohol: Limit how much you have to 0-1 drink a day. Know how much alcohol is in your drink. In the [place] one drink equals one 12 oz bottle of beer (355 mL), one 5 oz glass of wine (148 mL), or one 1 oz glass of hard liquor (44 mL). Lifestyle Brush your teeth every morning and night with fluoride toothpaste. Floss one time each day. Exercise for at least 30 minutes 5 or more days each week. Do not use any products that contain nicotine or tobacco. These products include cigarettes, chewing tobacco, and vaping devices, such as e-cigarettes. If you need help quitting, ask your health care provider. Do not use drugs. If you are sexually active, practice safe sex. Use a condom or other form of protection to   prevent STIs. If you do not wish to become pregnant, use a form of birth control. If you plan to become pregnant, see your health care provider for a  prepregnancy visit. Take aspirin only as told by your health care provider. Make sure that you understand how much to take and what form to take. Work with your health care provider to find out whether it is safe and beneficial for you to take aspirin daily. Find healthy ways to manage stress, such as: Meditation, yoga, or listening to music. Journaling. Talking to a trusted person. Spending time with friends and family. Minimize exposure to UV radiation to reduce your risk of skin cancer. Safety Always wear your seat belt while driving or riding in a vehicle. Do not drive: If you have been drinking alcohol. Do not ride with someone who has been drinking. When you are tired or distracted. While texting. If you have been using any mind-altering substances or drugs. Wear a helmet and other protective equipment during sports activities. If you have firearms in your house, make sure you follow all gun safety procedures. Seek help if you have been physically or sexually abused. What's next? Visit your health care provider once a year for an annual wellness visit. Ask your health care provider how often you should have your eyes and teeth checked. Stay up to date on all vaccines. This information is not intended to replace advice given to you by your health care provider. Make sure you discuss any questions you have with your health care provider. Document Revised: 09/30/2020 Document Reviewed: 09/30/2020 Elsevier Patient Education  [place].

## 2022-05-24 ENCOUNTER — Encounter: Payer: Self-pay | Admitting: Family Medicine

## 2022-05-24 ENCOUNTER — Ambulatory Visit (INDEPENDENT_AMBULATORY_CARE_PROVIDER_SITE_OTHER): Payer: 59 | Admitting: Family Medicine

## 2022-05-24 VITALS — BP 132/70 | HR 86 | Temp 98.9°F | Resp 18 | Ht 66.5 in | Wt 354.4 lb

## 2022-05-24 DIAGNOSIS — Z13820 Encounter for screening for osteoporosis: Secondary | ICD-10-CM | POA: Diagnosis not present

## 2022-05-24 DIAGNOSIS — Z1211 Encounter for screening for malignant neoplasm of colon: Secondary | ICD-10-CM

## 2022-05-24 DIAGNOSIS — Z0000 Encounter for general adult medical examination without abnormal findings: Secondary | ICD-10-CM

## 2022-06-17 ENCOUNTER — Ambulatory Visit: Payer: 59 | Admitting: Family Medicine

## 2022-06-25 LAB — CBC WITH DIFFERENTIAL/PLATELET
Basophils Absolute: 0 10*3/uL (ref 0.0–0.2)
Basos: 0 %
EOS (ABSOLUTE): 0.1 10*3/uL (ref 0.0–0.4)
Eos: 1 %
Hematocrit: 47 % — ABNORMAL HIGH (ref 34.0–46.6)
Hemoglobin: 15 g/dL (ref 11.1–15.9)
Immature Grans (Abs): 0 10*3/uL (ref 0.0–0.1)
Immature Granulocytes: 0 %
Lymphocytes Absolute: 2.2 10*3/uL (ref 0.7–3.1)
Lymphs: 31 %
MCH: 29 pg (ref 26.6–33.0)
MCHC: 31.9 g/dL (ref 31.5–35.7)
MCV: 91 fL (ref 79–97)
Monocytes Absolute: 0.5 10*3/uL (ref 0.1–0.9)
Monocytes: 8 %
Neutrophils Absolute: 4.2 10*3/uL (ref 1.4–7.0)
Neutrophils: 60 %
Platelets: 172 10*3/uL (ref 150–450)
RBC: 5.18 x10E6/uL (ref 3.77–5.28)
RDW: 14.7 % (ref 11.7–15.4)
WBC: 7 10*3/uL (ref 3.4–10.8)

## 2022-06-25 LAB — LIPID PANEL
Chol/HDL Ratio: 2.7 ratio (ref 0.0–4.4)
Cholesterol, Total: 160 mg/dL (ref 100–199)
HDL: 60 mg/dL (ref >39)
LDL Chol Calc (NIH): 80 mg/dL (ref 0–99)
Triglycerides: 110 mg/dL (ref 0–149)
VLDL Cholesterol Cal: 20 mg/dL (ref 5–40)

## 2022-06-25 LAB — TSH: TSH: 1.36 u[IU]/mL (ref 0.450–4.500)

## 2022-06-25 LAB — COMPREHENSIVE METABOLIC PANEL
ALT: 26 IU/L (ref 0–32)
AST: 23 IU/L (ref 0–40)
Albumin/Globulin Ratio: 1.7 (ref 1.2–2.2)
Albumin: 4.5 g/dL (ref 3.9–4.9)
Alkaline Phosphatase: 137 IU/L — ABNORMAL HIGH (ref 44–121)
BUN/Creatinine Ratio: 14 (ref 12–28)
BUN: 11 mg/dL (ref 8–27)
Bilirubin Total: 0.3 mg/dL (ref 0.0–1.2)
CO2: 24 mmol/L (ref 20–29)
Calcium: 10.1 mg/dL (ref 8.7–10.3)
Chloride: 99 mmol/L (ref 96–106)
Creatinine, Ser: 0.77 mg/dL (ref 0.57–1.00)
Globulin, Total: 2.7 g/dL (ref 1.5–4.5)
Glucose: 94 mg/dL (ref 70–99)
Potassium: 4.2 mmol/L (ref 3.5–5.2)
Sodium: 138 mmol/L (ref 134–144)
Total Protein: 7.2 g/dL (ref 6.0–8.5)
eGFR: 86 mL/min/{1.73_m2} (ref >59)

## 2022-06-25 LAB — B12 AND FOLATE PANEL
Folate: >20 ng/mL (ref >3.0)
Vitamin B-12: 1292 pg/mL — ABNORMAL HIGH (ref 232–1245)

## 2022-06-25 LAB — VITAMIN D 25 HYDROXY (VIT D DEFICIENCY, FRACTURES): Vit D, 25-Hydroxy: 52.9 ng/mL (ref 30.0–100.0)

## 2022-06-25 LAB — HEMOGLOBIN A1C
Est. average glucose Bld gHb Est-mCnc: 114 mg/dL
Hgb A1c MFr Bld: 5.6 % (ref 4.8–5.6)

## 2022-06-27 NOTE — Progress Notes (Unsigned)
Name: Emma Oconnor   MRN: [id number]    DOB: 11-30-1955   Date:06/28/2022       Progress Note  Subjective  Chief Complaint  Follow Up  HPI  HTN: taking bp medication Lotrel 5/20 at night , hydrochlorothiazide 12.5 mg in am She denies side effects. She denies  dizziness, headaches,  chest pain or palpitation. BP slightly elevated, but she is losing weight and we will continue current regiment    Pre-diabetes: hgbA1C was 5.9 % one year ago and is down to 5.6 %, continue life style modification    Hyperlipidemia: she is now on Rosuvastatin 20 mg and LDL is down from 112 to 80, continue current dose of medication, no side effects  Insomnia: she states not problems falling asleep, but sometimes wakes up due to hip pan. She takes trazodone half pills since started on Lyrica around 8: 30 to 9 am and is able to stay asleep until 3 am, wakes up and is able to fall back asleep, but able to fall back asleep quicker when on higher dose of trazodone  Unchanged   Sciatica and right knee pain: she was having a lot of  tingling/prickly sensation going down her legs, we added Lyrica and it has helped,she states now she is doing better and only taking it prn , since no symptoms in a while advised to stop medication   OSA: wearing CPAP every night, continue current regiment    Obesity: we started her on Contrave mid August 2020, she states the day she started the medication her weight was 339 lbs, she states it was  curbing her appetite , she stopped medication Dec 2020 because insurance stopped covering it. We tried her on Saxenda May 2021, she did not noticed any improvement of symptoms it was actually causing her appetite to increase . She did not lose any weight. We sent rx for Qsymia but required PA, and never went throught. She was seen in May 2022 and weight was 250 lbs, we started her on Contrave weigh was down to 245 lbs after 6 weeks, however it stopped working, we gave her Mancel Parsons but there was a gap  in therapy due to [employer] , she has been taking Wegovy 0.5 mg since  Dec 2023 . Her weight is down 9 lbs in the past month, she states occasionally still feels nauseated and is not ready to go up on the dose at this time. She also started Weight Watchers one month ago    Depression Major: she has a long history of depression got worse when she lost her daughter at  42 yo from a MVA March 2019 , her grandchildren are out of the house both 72 yo and is now raising her niece that is graduating in June and will go to college, after that she will only have to take care of her father that has dementia   LBBB: she does not have any problems at this time and not interested on going to cardiologist at this time . Unchanged   OA both knee  she was seen by Ortho - Dr. Sabra Heck - she is going to the gym, doing the elliptical machine , she states losing weight has improved her pain. She is also trying to walk more often at work. Pain is worse on the right 4/10, she taking Tylenol prn Usually gest worse when sitting down for a long period of time without taking walking breaks   Patient Active Problem List  Diagnosis Date Noted   Primary osteoarthritis of both knees 12/17/2021   Morbid obesity with BMI of 50.0-59.9, adult ([place]) 12/17/2021   Major depression in remission ([place]) 12/17/2021   Benign neoplasm of ascending colon    Benign neoplasm of sigmoid colon    Benign essential HTN 10/25/2014   Bursitis of shoulder 10/25/2014   Insomnia, persistent 10/25/2014   Dyslipidemia 10/25/2014   Female stress incontinence 10/25/2014   Eczema intertrigo 10/25/2014   Menopause 0000000   Dysmetabolic syndrome 0000000   OSA on CPAP 10/25/2014   Vitamin D deficiency 10/25/2014    Past Surgical History:  Procedure Laterality Date   COLONOSCOPY WITH PROPOFOL N/A 02/17/2015   Procedure: COLONOSCOPY WITH PROPOFOL;  Surgeon: Lucilla Lame, MD;  Location: [facility] ENDOSCOPY;  Service: Endoscopy;  Laterality:  N/A;   TUBAL LIGATION      Family History  Problem Relation Age of Onset   Diabetes Mother    Hypertension Mother    Hyperlipidemia Mother    Multiple sclerosis Brother        07/22/2010 passed away   Diabetes Brother    Breast cancer Neg Hx     Social History   Tobacco Use   Smoking status: Former    Packs/day: 0.50    Years: 30.00    Total pack years: 15.00    Types: Cigarettes    Start date: 04/19/1975    Quit date: 12/17/2005    Years since quitting: 16.5   Smokeless tobacco: Never  Substance Use Topics   Alcohol use: No    Alcohol/week: 0.0 standard drinks of alcohol     Current Outpatient Medications:    acetaminophen (TYLENOL) 500 MG tablet, Take 1 tablet (500 mg total) by mouth every 8 (eight) hours as needed., Disp: 90 tablet, Rfl: 0   Cyanocobalamin (B-12) 1000 MCG SUBL, Place 1 each under the tongue daily. M-F, Disp: , Rfl:    desonide (DESOWEN) 0.05 % cream, Apply topically 2 (two) times daily., Disp: 30 g, Rfl: 0   diclofenac Sodium (VOLTAREN) 1 % GEL, Apply 4 g topically 4 (four) times daily., Disp: 300 g, Rfl: 1   Vitamin D, Cholecalciferol, 25 MCG (1000 UT) TABS, Take 1 tablet by mouth daily., Disp: 30 tablet, Rfl: 0   amLODipine-benazepril (LOTREL) 5-20 MG capsule, Take 1 capsule by mouth daily., Disp: 90 capsule, Rfl: 1   hydrochlorothiazide (HYDRODIURIL) 12.5 MG tablet, Take 1 tablet (12.5 mg total) by mouth daily., Disp: 90 tablet, Rfl: 1   rosuvastatin (CRESTOR) 20 MG tablet, Take 1 tablet (20 mg total) by mouth daily., Disp: 90 tablet, Rfl: 1   Semaglutide-Weight Management (WEGOVY) 0.5 MG/0.5ML SOAJ, Inject 0.5 mg into the skin once a week., Disp: 6 mL, Rfl: 1   traZODone (DESYREL) 100 MG tablet, Take 1 tablet (100 mg total) by mouth at bedtime., Disp: 90 tablet, Rfl: 1  No Known Allergies  I personally reviewed active problem list, medication list, allergies, family history, social history, health maintenance with the patient/caregiver  today.   ROS  Ten systems reviewed and is negative except as mentioned in HPI   Objective  Vitals:   06/28/22 1521  BP: 136/72  Pulse: 95  Resp: 16  Temp: 97.9 F (36.6 C)  TempSrc: Oral  SpO2: 95%  Weight: (!) 343 lb 11.2 oz (155.9 kg)  Height: 5' 6.5" (1.689 m)    Body mass index is 54.64 kg/m.  Physical Exam  Constitutional: Patient appears well-developed and well-nourished. Obese  No distress.  HEENT: head atraumatic, normocephalic, pupils equal and reactive to light, neck supple Cardiovascular: Normal rate, regular rhythm and normal heart sounds.  No murmur heard. No BLE edema. Pulmonary/Chest: Effort normal and breath sounds normal. No respiratory distress. Abdominal: Soft.  There is no tenderness. Psychiatric: Patient has a normal mood and affect. behavior is normal. Judgment and thought content normal.    PHQ2/9:    06/28/2022    3:23 PM 05/24/2022    3:26 PM 12/17/2021    2:41 PM 08/16/2021   11:14 AM 05/17/2021    9:32 AM  Depression screen PHQ 2/9  Decreased Interest 0 0 0 0 0  Down, Depressed, Hopeless 0 0 0 0 0  PHQ - 2 Score 0 0 0 0 0  Altered sleeping 1 1 0 1 0  Tired, decreased energy 1 1 0 1 0  Change in appetite 0 0 0 0 0  Feeling bad or failure about yourself  0 0 0 0 0  Trouble concentrating 0 0 0 0 0  Moving slowly or fidgety/restless 0 0 0 0 0  Suicidal thoughts 0 0 0 0 0  PHQ-9 Score 2 2 0 2 0  Difficult doing work/chores Not difficult at all Not difficult at all Not difficult at all      phq 9 is negative   Fall Risk:    06/28/2022    3:23 PM 05/24/2022    3:25 PM 12/17/2021    2:41 PM 08/16/2021   11:14 AM 05/17/2021    9:32 AM  Fall Risk   Falls in the past year? 0 0 0 0 0  Number falls in past yr:   0 0 0  Injury with Fall?   0 0 0  Risk for fall due to : No Fall Risks No Fall Risks No Fall Risks No Fall Risks No Fall Risks  Follow up Falls prevention discussed Falls prevention discussed;Education provided;Falls evaluation completed  Falls prevention discussed;Education provided Falls prevention discussed Falls prevention discussed      Functional Status Survey: Is the patient deaf or have difficulty hearing?: No Does the patient have difficulty seeing, even when wearing glasses/contacts?: No Does the patient have difficulty concentrating, remembering, or making decisions?: No Does the patient have difficulty walking or climbing stairs?: Yes Does the patient have difficulty dressing or bathing?: No Does the patient have difficulty doing errands alone such as visiting a doctor's office or shopping?: No    Assessment & Plan  1. Morbid obesity with BMI of 50.0-59.9, adult [facility])  Doing well on current regiment   Wegovy 0.5 mg dose and weight watchers is helping  2. Major depression in remission [facility])  Doing well   3. Colon cancer screening  She will contact GI  4. Breast cancer screening by mammogram  - MM 3D SCREENING MAMMOGRAM BILATERAL BREAST; Future  5. Benign essential HTN  - amLODipine-benazepril (LOTREL) 5-20 MG capsule; Take 1 capsule by mouth daily.  Dispense: 90 capsule; Refill: 1 - hydrochlorothiazide (HYDRODIURIL) 12.5 MG tablet; Take 1 tablet (12.5 mg total) by mouth daily.  Dispense: 90 tablet; Refill: 1  6. Dyslipidemia  - rosuvastatin (CRESTOR) 20 MG tablet; Take 1 tablet (20 mg total) by mouth daily.  Dispense: 90 tablet; Refill: 1  7. Vitamin D deficiency  Continue supplementation  8. OSA on CPAP  Compliant   9. Primary osteoarthritis of both knees  Stable  10. Insomnia, persistent  - traZODone (DESYREL) 100 MG tablet; Take 1 tablet (100 mg total)  by mouth at bedtime.  Dispense: 90 tablet; Refill: 1  11. Dysmetabolic syndrome

## 2022-06-28 ENCOUNTER — Encounter: Payer: Self-pay | Admitting: Family Medicine

## 2022-06-28 ENCOUNTER — Ambulatory Visit (INDEPENDENT_AMBULATORY_CARE_PROVIDER_SITE_OTHER): Payer: 59 | Admitting: Family Medicine

## 2022-06-28 VITALS — BP 136/72 | HR 95 | Temp 97.9°F | Resp 16 | Ht 66.5 in | Wt 343.7 lb

## 2022-06-28 DIAGNOSIS — Z6841 Body Mass Index (BMI) 40.0 and over, adult: Secondary | ICD-10-CM

## 2022-06-28 DIAGNOSIS — E559 Vitamin D deficiency, unspecified: Secondary | ICD-10-CM

## 2022-06-28 DIAGNOSIS — G4700 Insomnia, unspecified: Secondary | ICD-10-CM

## 2022-06-28 DIAGNOSIS — E785 Hyperlipidemia, unspecified: Secondary | ICD-10-CM

## 2022-06-28 DIAGNOSIS — Z1231 Encounter for screening mammogram for malignant neoplasm of breast: Secondary | ICD-10-CM

## 2022-06-28 DIAGNOSIS — Z1211 Encounter for screening for malignant neoplasm of colon: Secondary | ICD-10-CM

## 2022-06-28 DIAGNOSIS — M170 Bilateral primary osteoarthritis of knee: Secondary | ICD-10-CM

## 2022-06-28 DIAGNOSIS — F325 Major depressive disorder, single episode, in full remission: Secondary | ICD-10-CM | POA: Diagnosis not present

## 2022-06-28 DIAGNOSIS — G4733 Obstructive sleep apnea (adult) (pediatric): Secondary | ICD-10-CM

## 2022-06-28 DIAGNOSIS — E538 Deficiency of other specified B group vitamins: Secondary | ICD-10-CM

## 2022-06-28 DIAGNOSIS — E8881 Metabolic syndrome: Secondary | ICD-10-CM

## 2022-06-28 DIAGNOSIS — I10 Essential (primary) hypertension: Secondary | ICD-10-CM

## 2022-06-28 MED ORDER — TRAZODONE HCL 100 MG PO TABS: 100.0000 mg | ORAL_TABLET | Freq: Every day | ORAL | 1 refills | Status: DC

## 2022-06-28 MED ORDER — WEGOVY 0.5 MG/0.5ML ~~LOC~~ SOAJ: 0.5000 mg | SUBCUTANEOUS | 1 refills | Status: DC

## 2022-06-28 MED ORDER — ROSUVASTATIN CALCIUM 20 MG PO TABS: 20.0000 mg | ORAL_TABLET | Freq: Every day | ORAL | 1 refills | Status: DC

## 2022-06-28 MED ORDER — AMLODIPINE BESY-BENAZEPRIL HCL 5-20 MG PO CAPS: 1.0000 | ORAL_CAPSULE | Freq: Every day | ORAL | 1 refills | Status: DC

## 2022-06-28 MED ORDER — HYDROCHLOROTHIAZIDE 12.5 MG PO TABS: 12.5000 mg | ORAL_TABLET | Freq: Every day | ORAL | 1 refills | Status: DC

## 2022-07-12 ENCOUNTER — Telehealth: Payer: Self-pay

## 2022-07-12 NOTE — Telephone Encounter (Signed)
[place] from [employer] called and the call was dropped before transfer to NT. Called [employer] back and spoke to [employer], Education administrator. She says the Mancel Parsons was denied and she's faxing over a denial letter with the reasons for the denial to fax # [phone].

## 2022-07-29 ENCOUNTER — Other Ambulatory Visit: Payer: Self-pay | Admitting: Family Medicine

## 2022-07-29 ENCOUNTER — Encounter: Payer: Self-pay | Admitting: Family Medicine

## 2022-07-29 MED ORDER — WEGOVY 0.5 MG/0.5ML ~~LOC~~ SOAJ: 0.5000 mg | SUBCUTANEOUS | 1 refills | Status: DC

## 2022-09-29 ENCOUNTER — Other Ambulatory Visit: Payer: Self-pay

## 2022-09-29 ENCOUNTER — Telehealth: Payer: Self-pay

## 2022-09-29 DIAGNOSIS — Z86010 Personal history of colonic polyps: Secondary | ICD-10-CM

## 2022-09-29 MED ORDER — NA SULFATE-K SULFATE-MG SULF 17.5-3.13-1.6 GM/177ML PO SOLN: 1.0000 | Freq: Once | ORAL | 0 refills | Status: AC

## 2022-09-29 NOTE — Telephone Encounter (Signed)
Pt left message to schedule colonoscopy please return call  

## 2022-09-29 NOTE — Telephone Encounter (Signed)
Gastroenterology Pre-Procedure Review  Request Date: 11/29/22 Requesting Physician: Dr. Servando Snare  PATIENT REVIEW QUESTIONS: The patient responded to the following health history questions as indicated:    1. Are you having any GI issues? no 2. Do you have a personal history of Polyps? yes (last colonoscopy performed by Dr. Servando Snare on 02/17/2015) 3. Do you have a family history of Colon Cancer or Polyps? no 4. Diabetes Mellitus? No patient takes Ozempic [facility]) has been advised to stop it 7 days prior to colonoscopy. 5. Joint replacements in the past 12 months?no 6. Major health problems in the past 3 months?no 7. Any artificial heart valves, MVP, or defibrillator?no    MEDICATIONS & ALLERGIES:    Patient reports the following regarding taking any anticoagulation/antiplatelet therapy:   Plavix, Coumadin, Eliquis, Xarelto, Lovenox, Pradaxa, Brilinta, or Effient? no Aspirin? no  Patient confirms/reports the following medications:  Current Outpatient Medications  Medication Sig Dispense Refill   acetaminophen (TYLENOL) 500 MG tablet Take 1 tablet (500 mg total) by mouth every 8 (eight) hours as needed. 90 tablet 0   amLODipine-benazepril (LOTREL) 5-20 MG capsule Take 1 capsule by mouth daily. 90 capsule 1   Cyanocobalamin (B-12) 1000 MCG SUBL Place 1 each under the tongue daily. M-F     desonide (DESOWEN) 0.05 % cream Apply topically 2 (two) times daily. 30 g 0   diclofenac Sodium (VOLTAREN) 1 % GEL Apply 4 g topically 4 (four) times daily. 300 g 1   hydrochlorothiazide (HYDRODIURIL) 12.5 MG tablet Take 1 tablet (12.5 mg total) by mouth daily. 90 tablet 1   rosuvastatin (CRESTOR) 20 MG tablet Take 1 tablet (20 mg total) by mouth daily. 90 tablet 1   Semaglutide-Weight Management (WEGOVY) 0.5 MG/0.5ML SOAJ Inject 0.5 mg into the skin once a week. 6 mL 1   traZODone (DESYREL) 100 MG tablet Take 1 tablet (100 mg total) by mouth at bedtime. 90 tablet 1   Vitamin D, Cholecalciferol, 25 MCG (1000 UT)  TABS Take 1 tablet by mouth daily. 30 tablet 0   No current facility-administered medications for this visit.    Patient confirms/reports the following allergies:  No Known Allergies  No orders of the defined types were placed in this encounter.   AUTHORIZATION INFORMATION Primary Insurance: 1D#: Group #:  Secondary Insurance: 1D#: Group #:  SCHEDULE INFORMATION: Date: 11/29/22 Time: Location: [facility]

## 2022-10-03 NOTE — Progress Notes (Unsigned)
Name: Emma Oconnor   MRN: [id number]    DOB: 04-02-1956   Date:10/04/2022       Progress Note  Subjective  Chief Complaint  Follow Up  HPI  HTN: taking bp medication Lotrel 5/20 at night , hydrochlorothiazide 12.5 mg in am She denies side effects. She denies  dizziness, headaches,  chest pain or palpitation. BP  is at goal today    Pre-diabetes: losing weight and exercising    Hyperlipidemia: she is now on Rosuvastatin 20 mg and LDL is down from 112 to 80,, continue medications   Insomnia: she states not problems falling asleep, but sometimes wakes up due to hip pan. Symptoms are stable   Sciatica and right knee pain: she was having a lot of  tingling/prickly sensation going down her legs, we added Lyrica but she was taking it prn, so we advised her to stop it    OSA: wearing CPAP every night, continue current regiment    Obesity: we started her on Contrave mid August 2020, she states the day she started the medication her weight was 339 lbs, she states it was  curbing her appetite , she stopped medication Dec 2020 when  insurance stopped covering the cost .  We tried her on Saxenda May 2021, she did not noticed any improvement of symptoms it was actually causing her appetite to increase . She did not lose any weight. We sent rx for Qsymia but required PA, and never went throught. She was seen in May 2022 and weight was 250 lbs, we started her on Contrave weigh was down to 245 lbs after 6 weeks, however it stopped working, we gave her Reginal Lutes but there was a gap in therapy due to [employer] , she has been taking Wegovy 0.5 mg since  Dec 2023, weight In Feb was 354 lbs and today is down to 336 lbs . We will adjust dose to 1 mg now  Depression Major: she has a long history of depression got worse when she lost her daughter at  89 yo from a MVA March 2019 , her grandchildren are out of the house both 24 yo and is now raising her niece that just finished HS.She is worried about her  father that has dementia   LBBB: she does not have any problems at this time and not interested on going to cardiologist at this time Unchanged   OA both knee  she was seen by Ortho - Dr. Hyacinth Meeker - she is going to the gym, doing the elliptical machine , she states losing weight has improved her pain. Current pain is zero , after moderate activity she has some pain up to 5-6/10 but does not  last long after rest   Patient Active Problem List   Diagnosis Date Noted   Primary osteoarthritis of both knees 12/17/2021   Morbid obesity with BMI of 50.0-59.9, adult ([facility]) 12/17/2021   Major depression in remission ([facility]) 12/17/2021   Benign neoplasm of ascending colon    Benign neoplasm of sigmoid colon    Benign essential HTN 10/25/2014   Bursitis of shoulder 10/25/2014   Insomnia, persistent 10/25/2014   Dyslipidemia 10/25/2014   Female stress incontinence 10/25/2014   Eczema intertrigo 10/25/2014   Menopause 10/25/2014   Dysmetabolic syndrome 10/25/2014   OSA on CPAP 10/25/2014   Vitamin D deficiency 10/25/2014    Past Surgical History:  Procedure Laterality Date   COLONOSCOPY WITH PROPOFOL N/A 02/17/2015   Procedure: COLONOSCOPY WITH PROPOFOL;  Surgeon: [name], MD;  Location: [facility] ENDOSCOPY;  Service: Endoscopy;  Laterality: N/A;   TUBAL LIGATION      Family History  Problem Relation Age of Onset   Diabetes Mother    Hypertension Mother    Hyperlipidemia Mother    Multiple sclerosis Brother        October 21, 2010 passed away   Diabetes Brother    Breast cancer Neg Hx     Social History   Tobacco Use   Smoking status: Former    Packs/day: 0.50    Years: 30.00    Additional pack years: 0.00    Total pack years: 15.00    Types: Cigarettes    Start date: 04/19/1975    Quit date: 12/17/2005    Years since quitting: 16.8   Smokeless tobacco: Never  Substance Use Topics   Alcohol use: No    Alcohol/week: 0.0 standard drinks of alcohol     Current Outpatient Medications:     acetaminophen (TYLENOL) 500 MG tablet, Take 1 tablet (500 mg total) by mouth every 8 (eight) hours as needed., Disp: 90 tablet, Rfl: 0   amLODipine-benazepril (LOTREL) 5-20 MG capsule, Take 1 capsule by mouth daily., Disp: 90 capsule, Rfl: 1   Cyanocobalamin (B-12) 1000 MCG SUBL, Place 1 each under the tongue daily. M-F, Disp: , Rfl:    desonide (DESOWEN) 0.05 % cream, Apply topically 2 (two) times daily., Disp: 30 g, Rfl: 0   diclofenac Sodium (VOLTAREN) 1 % GEL, Apply 4 g topically 4 (four) times daily., Disp: 300 g, Rfl: 1   hydrochlorothiazide (HYDRODIURIL) 12.5 MG tablet, Take 1 tablet (12.5 mg total) by mouth daily., Disp: 90 tablet, Rfl: 1   rosuvastatin (CRESTOR) 20 MG tablet, Take 1 tablet (20 mg total) by mouth daily., Disp: 90 tablet, Rfl: 1   Semaglutide-Weight Management (WEGOVY) 0.5 MG/0.5ML SOAJ, Inject 0.5 mg into the skin once a week., Disp: 6 mL, Rfl: 1   traZODone (DESYREL) 100 MG tablet, Take 1 tablet (100 mg total) by mouth at bedtime., Disp: 90 tablet, Rfl: 1   Vitamin D, Cholecalciferol, 25 MCG (1000 UT) TABS, Take 1 tablet by mouth daily., Disp: 30 tablet, Rfl: 0  No Known Allergies  I personally reviewed active problem list, medication list, allergies, family history, social history, health maintenance with the patient/caregiver today.   ROS  Constitutional: Negative for fever , positive for weight change.  Respiratory: Negative for cough and shortness of breath.   Cardiovascular: Negative for chest pain or palpitations.  Gastrointestinal: Negative for abdominal pain, no bowel changes.  Musculoskeletal: Negative for gait problem or joint swelling.  Skin: Negative for rash.  Neurological: Negative for dizziness or headache.  No other specific complaints in a complete review of systems (except as listed in HPI above).   Objective  Vitals:   10/04/22 1504  BP: 128/70  Pulse: 82  Resp: 18  Temp: 98.1 F (36.7 C)  TempSrc: Oral  SpO2: 97%  Weight: (!) 336 lb  12.8 oz (152.8 kg)  Height: 5' 6.5" (1.689 m)    Body mass index is 53.55 kg/m.  Physical Exam  Constitutional: Patient appears well-developed and well-nourished. Obese  No distress.  HEENT: head atraumatic, normocephalic, pupils equal and reactive to light, neck supple Cardiovascular: Normal rate, regular rhythm and normal heart sounds.  No murmur heard. No BLE edema. Pulmonary/Chest: Effort normal and breath sounds normal. No respiratory distress. Abdominal: Soft.  There is no tenderness. Psychiatric: Patient has a normal mood and  affect. behavior is normal. Judgment and thought content normal.   PHQ2/9:    10/04/2022    3:10 PM 06/28/2022    3:23 PM 05/24/2022    3:26 PM 12/17/2021    2:41 PM 08/16/2021   11:14 AM  Depression screen PHQ 2/9  Decreased Interest 0 0 0 0 0  Down, Depressed, Hopeless 0 0 0 0 0  PHQ - 2 Score 0 0 0 0 0  Altered sleeping 0 1 1 0 1  Tired, decreased energy 0 1 1 0 1  Change in appetite 0 0 0 0 0  Feeling bad or failure about yourself  0 0 0 0 0  Trouble concentrating 0 0 0 0 0  Moving slowly or fidgety/restless 0 0 0 0 0  Suicidal thoughts 0 0 0 0 0  PHQ-9 Score 0 2 2 0 2  Difficult doing work/chores  Not difficult at all Not difficult at all Not difficult at all     phq 9 is negative   Fall Risk:    10/04/2022    3:06 PM 06/28/2022    3:23 PM 05/24/2022    3:25 PM 12/17/2021    2:41 PM 08/16/2021   11:14 AM  Fall Risk   Falls in the past year? 0 0 0 0 0  Number falls in past yr:    0 0  Injury with Fall?    0 0  Risk for fall due to : No Fall Risks No Fall Risks No Fall Risks No Fall Risks No Fall Risks  Follow up Falls prevention discussed Falls prevention discussed Falls prevention discussed;Education provided;Falls evaluation completed Falls prevention discussed;Education provided Falls prevention discussed      Functional Status Survey: Is the patient deaf or have difficulty hearing?: No Does the patient have difficulty seeing, even when  wearing glasses/contacts?: No Does the patient have difficulty concentrating, remembering, or making decisions?: No Does the patient have difficulty walking or climbing stairs?: Yes Does the patient have difficulty dressing or bathing?: No Does the patient have difficulty doing errands alone such as visiting a doctor's office or shopping?: No    Assessment & Plan  1. Major depression in remission [facility])  Doing well   2. Morbid obesity with BMI of 50.0-59.9, adult ([facility])  - Semaglutide-Weight Management (WEGOVY) 1 MG/0.5ML SOAJ; Inject 1 mg into the skin once a week.  Dispense: 6 mL; Refill: 0  3. OSA on CPAP  Continue CPAP   4. Benign essential HTN  BP is at goal   5. Dyslipidemia  On statin therapy and last LDL was 80 - at goal   6. Vitamin D deficiency  Continue vitamin D   7. B12 deficiency   Continue B12 supplementation

## 2022-10-04 ENCOUNTER — Ambulatory Visit: Payer: 59 | Admitting: Family Medicine

## 2022-10-04 ENCOUNTER — Encounter: Payer: Self-pay | Admitting: Family Medicine

## 2022-10-04 VITALS — BP 128/70 | HR 82 | Temp 98.1°F | Resp 18 | Ht 66.5 in | Wt 336.8 lb

## 2022-10-04 DIAGNOSIS — E785 Hyperlipidemia, unspecified: Secondary | ICD-10-CM

## 2022-10-04 DIAGNOSIS — E559 Vitamin D deficiency, unspecified: Secondary | ICD-10-CM

## 2022-10-04 DIAGNOSIS — E538 Deficiency of other specified B group vitamins: Secondary | ICD-10-CM

## 2022-10-04 DIAGNOSIS — G4700 Insomnia, unspecified: Secondary | ICD-10-CM

## 2022-10-04 DIAGNOSIS — F325 Major depressive disorder, single episode, in full remission: Secondary | ICD-10-CM

## 2022-10-04 DIAGNOSIS — G4733 Obstructive sleep apnea (adult) (pediatric): Secondary | ICD-10-CM | POA: Diagnosis not present

## 2022-10-04 DIAGNOSIS — I10 Essential (primary) hypertension: Secondary | ICD-10-CM

## 2022-10-04 DIAGNOSIS — Z6841 Body Mass Index (BMI) 40.0 and over, adult: Secondary | ICD-10-CM

## 2022-10-04 MED ORDER — WEGOVY 1 MG/0.5ML ~~LOC~~ SOAJ
1.0000 mg | SUBCUTANEOUS | 0 refills | Status: DC
Start: 2022-10-04 — End: 2023-04-26

## 2022-11-14 ENCOUNTER — Ambulatory Visit
Admission: RE | Admit: 2022-11-14 | Discharge: 2022-11-14 | Disposition: A | Discharge status: Home / Self Care | Payer: 59 | Source: Ambulatory Visit | Attending: Family Medicine | Admitting: Family Medicine

## 2022-11-14 DIAGNOSIS — Z1231 Encounter for screening mammogram for malignant neoplasm of breast: Secondary | ICD-10-CM | POA: Insufficient documentation

## 2022-11-18 ENCOUNTER — Other Ambulatory Visit: Payer: Self-pay | Admitting: Family Medicine

## 2022-11-18 DIAGNOSIS — E785 Hyperlipidemia, unspecified: Secondary | ICD-10-CM

## 2022-11-22 ENCOUNTER — Other Ambulatory Visit: Payer: Self-pay

## 2022-11-22 ENCOUNTER — Encounter: Payer: Self-pay | Admitting: Gastroenterology

## 2022-11-28 ENCOUNTER — Telehealth: Payer: Self-pay

## 2022-11-28 ENCOUNTER — Encounter: Payer: Self-pay | Admitting: Gastroenterology

## 2022-11-28 NOTE — Telephone Encounter (Signed)
Patient is calling because she states she took her vitamins on Saturday and then read her paperwork on Saturday afternoon. She states she takes Vitamin D, Vitamin C, Vitmin b12, Zinic and Fish oil. Informed patient it was okay just do not take anymore

## 2022-11-29 ENCOUNTER — Encounter
Admission: RE | Disposition: A | Discharge status: Home / Self Care | Payer: Self-pay | Source: Home / Self Care | Attending: Gastroenterology

## 2022-11-29 ENCOUNTER — Ambulatory Visit: Payer: 59 | Admitting: Registered Nurse

## 2022-11-29 ENCOUNTER — Encounter: Payer: Self-pay | Admitting: Gastroenterology

## 2022-11-29 ENCOUNTER — Ambulatory Visit
Admission: RE | Admit: 2022-11-29 | Discharge: 2022-11-29 | Disposition: A | Discharge status: Home / Self Care | Payer: 59 | Source: Home / Self Care | Attending: Gastroenterology | Admitting: Gastroenterology

## 2022-11-29 DIAGNOSIS — K641 Second degree hemorrhoids: Secondary | ICD-10-CM | POA: Diagnosis not present

## 2022-11-29 DIAGNOSIS — Z1211 Encounter for screening for malignant neoplasm of colon: Secondary | ICD-10-CM | POA: Diagnosis not present

## 2022-11-29 DIAGNOSIS — Z86010 Personal history of colonic polyps: Secondary | ICD-10-CM | POA: Insufficient documentation

## 2022-11-29 DIAGNOSIS — D123 Benign neoplasm of transverse colon: Secondary | ICD-10-CM | POA: Diagnosis not present

## 2022-11-29 DIAGNOSIS — I10 Essential (primary) hypertension: Secondary | ICD-10-CM | POA: Insufficient documentation

## 2022-11-29 DIAGNOSIS — K635 Polyp of colon: Secondary | ICD-10-CM

## 2022-11-29 DIAGNOSIS — G4730 Sleep apnea, unspecified: Secondary | ICD-10-CM | POA: Diagnosis not present

## 2022-11-29 DIAGNOSIS — Z87891 Personal history of nicotine dependence: Secondary | ICD-10-CM | POA: Insufficient documentation

## 2022-11-29 DIAGNOSIS — Z7985 Long-term (current) use of injectable non-insulin antidiabetic drugs: Secondary | ICD-10-CM | POA: Insufficient documentation

## 2022-11-29 DIAGNOSIS — D125 Benign neoplasm of sigmoid colon: Secondary | ICD-10-CM | POA: Diagnosis not present

## 2022-11-29 DIAGNOSIS — Z6841 Body Mass Index (BMI) 40.0 and over, adult: Secondary | ICD-10-CM | POA: Diagnosis not present

## 2022-11-29 DIAGNOSIS — D120 Benign neoplasm of cecum: Secondary | ICD-10-CM | POA: Insufficient documentation

## 2022-11-29 DIAGNOSIS — D122 Benign neoplasm of ascending colon: Secondary | ICD-10-CM | POA: Insufficient documentation

## 2022-11-29 DIAGNOSIS — K5730 Diverticulosis of large intestine without perforation or abscess without bleeding: Secondary | ICD-10-CM | POA: Diagnosis not present

## 2022-11-29 HISTORY — PX: POLYPECTOMY: SHX5525

## 2022-11-29 HISTORY — PX: COLONOSCOPY WITH PROPOFOL: SHX5780

## 2022-11-29 HISTORY — PX: HOT HEMOSTASIS: SHX5433

## 2022-11-29 SURGERY — COLONOSCOPY WITH PROPOFOL
Anesthesia: General

## 2022-11-29 MED ORDER — PROPOFOL 10 MG/ML IV BOLUS
INTRAVENOUS | Status: DC | PRN
  Administered 2022-11-29: 30 mg via INTRAVENOUS
  Administered 2022-11-29: 140 ug/kg/min via INTRAVENOUS
  Administered 2022-11-29: 80 mg via INTRAVENOUS
  Administered 2022-11-29: 30 mg via INTRAVENOUS
  Administered 2022-11-29: 120 ug/kg/min via INTRAVENOUS

## 2022-11-29 MED ORDER — LIDOCAINE HCL (CARDIAC) PF 100 MG/5ML IV SOSY
PREFILLED_SYRINGE | INTRAVENOUS | Status: DC | PRN
  Administered 2022-11-29: 40 mg via INTRAVENOUS

## 2022-11-29 MED ORDER — DEXMEDETOMIDINE HCL IN NACL 80 MCG/20ML IV SOLN
INTRAVENOUS | Status: DC | PRN
  Administered 2022-11-29 (×2): 4 ug via INTRAVENOUS

## 2022-11-29 MED ORDER — SODIUM CHLORIDE 0.9 % IV SOLN: INTRAVENOUS | Status: DC

## 2022-11-29 NOTE — Anesthesia Preprocedure Evaluation (Addendum)
Anesthesia Evaluation  Patient identified by MRN, date of birth, ID band Patient awake    Reviewed: Allergy & Precautions, H&P , NPO status , Patient's Chart, lab work & pertinent test results  Airway Mallampati: II  TM Distance: >3 FB Neck ROM: full    Dental  (+) Edentulous Lower, Edentulous Upper   Pulmonary sleep apnea , former smoker   Pulmonary exam normal        Cardiovascular METS: 3 - Mets hypertension, Normal cardiovascular exam     Neuro/Psych  PSYCHIATRIC DISORDERS  Depression    negative neurological ROS     GI/Hepatic negative GI ROS, Neg liver ROS,,,  Endo/Other    Morbid obesity  Renal/GU negative Renal ROS  negative genitourinary   Musculoskeletal  (+) Arthritis ,    Abdominal  (+) + obese  Peds  Hematology negative hematology ROS (+)   Anesthesia Other Findings Past Medical History: No date: Hyperlipidemia No date: Hypertension No date: Incontinence No date: Insomnia No date: Menopause No date: Metabolic syndrome No date: Obesity No date: Sleep apnea No date: Vitamin D deficiency  Past Surgical History: 02/17/2015: COLONOSCOPY WITH PROPOFOL; N/A     Comment:  Procedure: COLONOSCOPY WITH PROPOFOL;  Surgeon: [name], MD;  Location: [facility] ENDOSCOPY;  Service: Endoscopy;              Laterality: N/A; No date: TUBAL LIGATION  BMI    Body Mass Index: 53.26 kg/m      Reproductive/Obstetrics negative OB ROS                             Anesthesia Physical Anesthesia Plan  ASA: 3  Anesthesia Plan: General   Post-op Pain Management: Minimal or no pain anticipated   Induction: Intravenous  PONV Risk Score and Plan: Propofol infusion and TIVA  Airway Management Planned: Natural Airway and Simple Face Mask  Additional Equipment:   Intra-op Plan:   Post-operative Plan:   Informed Consent: I have reviewed the patients History and Physical,  chart, labs and discussed the procedure including the risks, benefits and alternatives for the proposed anesthesia with the patient or authorized representative who has indicated his/her understanding and acceptance.     Dental Advisory Given  Plan Discussed with: CRNA and Surgeon  Anesthesia Plan Comments:         Anesthesia Quick Evaluation

## 2022-11-29 NOTE — Anesthesia Postprocedure Evaluation (Signed)
Anesthesia Post Note  Patient: Emma Oconnor  Procedure(s) Performed: COLONOSCOPY WITH PROPOFOL POLYPECTOMY  Patient location during evaluation: Endoscopy Anesthesia Type: General Level of consciousness: awake and alert Pain management: pain level controlled Vital Signs Assessment: post-procedure vital signs reviewed and stable Respiratory status: spontaneous breathing, nonlabored ventilation and respiratory function stable Cardiovascular status: blood pressure returned to baseline and stable Postop Assessment: no apparent nausea or vomiting Anesthetic complications: no   No notable events documented.   Last Vitals:  Vitals:   11/29/22 0925 11/29/22 0935  BP: 101/69 119/84  Pulse: 72 63  Resp: 20 20  Temp:    SpO2: 99% 97%    Last Pain:  Vitals:   11/29/22 0925  TempSrc:   PainSc: 0-No pain                 Foye Deer

## 2022-11-29 NOTE — Anesthesia Procedure Notes (Signed)
Procedure Name: General with mask airway Date/Time: 11/29/2022 8:50 AM  Performed by: Lily Lovings, CRNAPre-anesthesia Checklist: Patient identified, Emergency Drugs available, Suction available, Patient being monitored and Timeout performed Patient Re-evaluated:Patient Re-evaluated prior to induction Oxygen Delivery Method: Supernova nasal CPAP Preoxygenation: Pre-oxygenation with 100% oxygen Induction Type: IV induction

## 2022-11-29 NOTE — Op Note (Signed)
[facility] Gastroenterology Patient Name: Emma Oconnor Procedure Date: 11/29/2022 8:31 AM MRN: [id number] Account #: [id number] Date of Birth: 1955/06/23 Admit Type: Outpatient Age: 67 Room: [facility] ENDO ROOM 4 Gender: Female Note Status: Finalized Instrument Name: Prentice Docker [id number] Procedure:             Colonoscopy Indications:           High risk colon cancer surveillance: Personal history                         of colonic polyps Providers:             [name] MD, MD Referring MD:          Onnie Boer. Sowles, MD (Referring MD) Medicines:             Propofol per Anesthesia Complications:         No immediate complications. Procedure:             Pre-Anesthesia Assessment:                        - Prior to the procedure, a History and Physical was                         performed, and patient medications and allergies were                         reviewed. The patient's tolerance of previous                         anesthesia was also reviewed. The risks and benefits                         of the procedure and the sedation options and risks                         were discussed with the patient. All questions were                         answered, and informed consent was obtained. Prior                         Anticoagulants: The patient has taken no anticoagulant                         or antiplatelet agents. ASA Grade Assessment: II - A                         patient with mild systemic disease. After reviewing                         the risks and benefits, the patient was deemed in                         satisfactory condition to undergo the procedure.                        After obtaining informed consent, the colonoscope was  passed under direct vision. Throughout the procedure,                         the patient's blood pressure, pulse, and oxygen                         saturations were monitored continuously. The                          Colonoscope was introduced through the anus and                         advanced to the the cecum, identified by appendiceal                         orifice and ileocecal valve. The colonoscopy was                         performed without difficulty. The patient tolerated                         the procedure well. The quality of the bowel                         preparation was excellent. Findings:      The perianal and digital rectal examinations were normal.      A 3 mm polyp was found in the cecum. The polyp was sessile. The polyp       was removed with a cold snare. Resection and retrieval were complete.      A 4 mm polyp was found in the cecum. The polyp was sessile. The polyp       was removed with a cold snare. Resection and retrieval were complete.      A 10 mm polyp was found in the cecum. The polyp was sessile.       Preparations were made for mucosal resection. Chromoscopy with indigo       carmine was done. Demarcation of the lesion was performed during the       procedure to clearly identify the boundaries of the lesion. Saline with       indigo carmine was injected to raise the lesion. Snare mucosal resection       was performed. Resection and retrieval were complete. Resected tissue       margins were examined and clear of polyp tissue. To close a defect after       polypectomy, two hemostatic clips were successfully placed (MR       conditional). Clip manufacturer: [employer]. There was no       bleeding at the end of the procedure.      Two sessile polyps were found in the ascending colon. The polyps were 2       to 4 mm in size. These polyps were removed with a cold snare. Resection       and retrieval were complete.      Two sessile polyps were found in the transverse colon. The polyps were 3       to 4 mm in size. These polyps were removed with a cold snare. Resection       and retrieval were complete.      A  2 mm polyp was found in the  sigmoid colon. The polyp was sessile. The       polyp was removed with a cold snare. Resection and retrieval were       complete.      Multiple small-mouthed diverticula were found in the sigmoid colon.      Non-bleeding internal hemorrhoids were found during retroflexion. The       hemorrhoids were Grade II (internal hemorrhoids that prolapse but reduce       spontaneously). Impression:            - One 3 mm polyp in the cecum, removed with a cold                         snare. Resected and retrieved.                        - One 4 mm polyp in the cecum, removed with a cold                         snare. Resected and retrieved.                        - One 10 mm polyp in the cecum, removed with mucosal                         resection. Resected and retrieved. Clips (MR                         conditional) were placed. Clip manufacturer: [employer].                        - Two 2 to 4 mm polyps in the ascending colon, removed                         with a cold snare. Resected and retrieved.                        - Two 3 to 4 mm polyps in the transverse colon,                         removed with a cold snare. Resected and retrieved.                        - One 2 mm polyp in the sigmoid colon, removed with a                         cold snare. Resected and retrieved.                        - Diverticulosis in the sigmoid colon.                        - Non-bleeding internal hemorrhoids.                        - Mucosal  resection was performed. Resection and                         retrieval were complete. Recommendation:        - Discharge patient to home.                        - Resume previous diet.                        - Continue present medications.                        - Await pathology results.                        - Repeat colonoscopy in 3 years for surveillance. Procedure Code(s):     --- Professional ---                        [phone],  Colonoscopy, flexible; with endoscopic mucosal                         resection                        45385, 59, Colonoscopy, flexible; with removal of                         tumor(s), polyp(s), or other lesion(s) by snare                         technique Diagnosis Code(s):     --- Professional ---                        Z86.010, Personal history of colonic polyps                        D12.5, Benign neoplasm of sigmoid colon CPT copyright 2022 American Medical Association. All rights reserved. The codes documented in this report are preliminary and upon coder review may  be revised to meet current compliance requirements. [name] MD, MD 11/29/2022 9:15:12 AM This report has been signed electronically. Number of Addenda: 0 Note Initiated On: 11/29/2022 8:31 AM Scope Withdrawal Time: 0 hours 16 minutes 14 seconds  Total Procedure Duration: 0 hours 23 minutes 58 seconds  Estimated Blood Loss:  Estimated blood loss: none.      [facility]

## 2022-11-29 NOTE — H&P (Signed)
Emma Minium, MD [facility] [address], [address] [place], [place] [address] Phone:[phone] Fax : [phone]  Primary Care Physician:  Emma Cory, MD Primary Gastroenterologist:  Dr. Servando Snare  Pre-Procedure History & Physical: HPI:  Emma Oconnor is a 67 y.o. female is here for an colonoscopy.   Past Medical History:  Diagnosis Date   Hyperlipidemia    Hypertension    Incontinence    Insomnia    Menopause    Metabolic syndrome    Obesity    Sleep apnea    Vitamin D deficiency     Past Surgical History:  Procedure Laterality Date   COLONOSCOPY WITH PROPOFOL N/A 02/17/2015   Procedure: COLONOSCOPY WITH PROPOFOL;  Surgeon: Emma Minium, MD;  Location: [facility] ENDOSCOPY;  Service: Endoscopy;  Laterality: N/A;   TUBAL LIGATION      Prior to Admission medications   Medication Sig Start Date End Date Taking? Authorizing Provider  amLODipine-benazepril (LOTREL) 5-20 MG capsule Take 1 capsule by mouth daily. 06/28/22  Yes Sowles, Danna Hefty, MD  Ascorbic Acid (VITAMIN C) 100 MG tablet Take 100 mg by mouth daily.   Yes [provider]  magnesium 30 MG tablet Take 30 mg by mouth 2 (two) times daily.   Yes [provider]  meloxicam (MOBIC) 7.5 MG tablet Take 7.5 mg by mouth daily.   Yes [provider]  zinc gluconate 50 MG tablet Take 50 mg by mouth daily.   Yes [provider]  acetaminophen (TYLENOL) 500 MG tablet Take 1 tablet (500 mg total) by mouth every 8 (eight) hours as needed. 06/30/15   Emma Cory, MD  Cyanocobalamin (B-12) 1000 MCG SUBL Place 1 each under the tongue daily. M-F    [provider]  desonide (DESOWEN) 0.05 % cream Apply topically 2 (two) times daily. 12/02/19   Emma Cory, MD  diclofenac Sodium (VOLTAREN) 1 % GEL Apply 4 g topically 4 (four) times daily. 12/17/21   Emma Cory, MD  hydrochlorothiazide (HYDRODIURIL) 12.5 MG tablet Take 1 tablet (12.5 mg total) by mouth daily. 06/28/22   Emma Cory, MD   rosuvastatin (CRESTOR) 20 MG tablet TAKE 1 TABLET BY MOUTH DAILY 11/18/22   Emma Cory, MD  Semaglutide-Weight Management [facility]) 1 MG/0.5ML SOAJ Inject 1 mg into the skin once a week. 10/04/22   Emma Cory, MD  traZODone (DESYREL) 100 MG tablet Take 1 tablet (100 mg total) by mouth at bedtime. 06/28/22   Emma Cory, MD  Vitamin D, Cholecalciferol, 25 MCG (1000 UT) TABS Take 1 tablet by mouth daily. 11/19/18   Emma Cory, MD    Allergies as of 09/29/2022   (No Known Allergies)    Family History  Problem Relation Age of Onset   Diabetes Mother    Hypertension Mother    Hyperlipidemia Mother    Multiple sclerosis Brother        12/20/10 passed away   Diabetes Brother    Breast cancer Neg Hx     Social History   Socioeconomic History   Marital status: Married    Spouse name: Emma Oconnor   Number of children: 3   Years of education: Not on file   Highest education level: High school graduate  Occupational History   Not on file  Tobacco Use   Smoking status: Former    Current packs/day: 0.00    Average packs/day: 0.5 packs/day for 30.7 years (15.3 ttl pk-yrs)    Types: Cigarettes    Start date: 04/19/1975  Quit date: 12/17/2005    Years since quitting: 16.9   Smokeless tobacco: Never  Vaping Use   Vaping status: Never Used  Substance and Sexual Activity   Alcohol use: No    Alcohol/week: 0.0 standard drinks of alcohol   Drug use: No   Sexual activity: Not Currently    Partners: Male  Other Topics Concern   Not on file  Social History Narrative   Married.   She is taking online classes trying to finish her Psychology degree with forensics    She had 3 children, one of her daughter's died 03/26/2019from MVA and currently raised her 3 children ( two girls and one boy ) and also lost her niece May 2019 and is raising her teenage daughter.    Social Determinants of Health   Financial Resource Strain: Low Risk  (05/24/2022)   Overall Financial Resource Strain  (CARDIA)    Difficulty of Paying Living Expenses: Not hard at all  Food Insecurity: No Food Insecurity (05/24/2022)   Hunger Vital Sign    Worried About Running Out of Food in the Last Year: Never true    Ran Out of Food in the Last Year: Never true  Transportation Needs: No Transportation Needs (05/24/2022)   PRAPARE - Administrator, Civil Service (Medical): No    Lack of Transportation (Non-Medical): No  Physical Activity: Inactive (05/24/2022)   Exercise Vital Sign    Days of Exercise per Week: 0 days    Minutes of Exercise per Session: 0 min  Stress: No Stress Concern Present (05/24/2022)   [employer] of Occupational Health - Occupational Stress Questionnaire    Feeling of Stress : Only a little  Social Connections: Socially Integrated (05/24/2022)   Social Connection and Isolation Panel [NHANES]    Frequency of Communication with Friends and Family: More than three times a week    Frequency of Social Gatherings with Friends and Family: Once a week    Attends Religious Services: More than 4 times per year    Active Member of [employer] or Organizations: Yes    Attends Banker Meetings: 1 to 4 times per year    Marital Status: Married  Catering manager Violence: Not At Risk (05/24/2022)   Humiliation, Afraid, Rape, and Kick questionnaire    Fear of Current or Ex-Partner: No    Emotionally Abused: No    Physically Abused: No    Sexually Abused: No    Review of Systems: See HPI, otherwise negative ROS  Physical Exam: BP (!) 153/79   Pulse 75   Temp (!) 96.4 F (35.8 C) (Temporal)   Resp 16   Ht 5' 6.5" (1.689 m)   Wt (!) 147.4 kg   SpO2 96%   BMI 51.67 kg/m  General:   Alert,  pleasant and cooperative in NAD Head:  Normocephalic and atraumatic. Neck:  Supple; no masses or thyromegaly. Lungs:  Clear throughout to auscultation.    Heart:  Regular rate and rhythm. Abdomen:  Soft, nontender and nondistended. Normal bowel sounds, without guarding, and  without rebound.   Neurologic:  Alert and  oriented x4;  grossly normal neurologically.  Impression/Plan: KERSTAN BUELOW is here for an colonoscopy to be performed for a history of adenomatous polyps on 2016   Risks, benefits, limitations, and alternatives regarding  colonoscopy have been reviewed with the patient.  Questions have been answered.  All parties agreeable.   Emma Minium, MD  11/29/2022, 8:26 AM

## 2022-11-29 NOTE — Transfer of Care (Signed)
Immediate Anesthesia Transfer of Care Note  Patient: Emma Oconnor  Procedure(s) Performed: COLONOSCOPY WITH PROPOFOL POLYPECTOMY  Patient Location: Endoscopy Unit  Anesthesia Type:MAC  Level of Consciousness: awake and sedated  Airway & Oxygen Therapy: Patient Spontanous Breathing and Patient connected to face mask oxygen  Post-op Assessment: Report given to RN and Post -op Vital signs reviewed and stable  Post vital signs: Reviewed and stable  Last Vitals:  Vitals Value Taken Time  BP 90/60 0915  Temp    Pulse 72   Resp 18   SpO2 100     Last Pain:  Vitals:   11/29/22 0738  TempSrc: Temporal  PainSc: 0-No pain         Complications: No notable events documented.

## 2022-11-30 ENCOUNTER — Encounter: Payer: Self-pay | Admitting: Gastroenterology

## 2022-12-01 ENCOUNTER — Encounter: Payer: Self-pay | Admitting: Gastroenterology

## 2023-01-02 ENCOUNTER — Other Ambulatory Visit: Payer: Self-pay | Admitting: Family Medicine

## 2023-01-03 NOTE — Progress Notes (Deleted)
Name: Emma Oconnor   MRN: [id number]    DOB: [birth year]   Date:01/03/2023       Progress Note  Subjective  Chief Complaint  Follow Up  HPI  HTN: taking bp medication Lotrel 5/20 at night , hydrochlorothiazide 12.5 mg in am She denies side effects. She denies  dizziness, headaches,  chest pain or palpitation. BP  is at goal today    Pre-diabetes: losing weight and exercising    Hyperlipidemia: she is now on Rosuvastatin 20 mg and LDL is down from 112 to 80,, continue medications   Insomnia: she states not problems falling asleep, but sometimes wakes up due to hip pan. Symptoms are stable   Sciatica and right knee pain: she was having a lot of  tingling/prickly sensation going down her legs, we added Lyrica but she was taking it prn, so we advised her to stop it    OSA: wearing CPAP every night, continue current regiment    Obesity: we started her on Contrave mid August 2020, she states the day she started the medication her weight was 339 lbs, she states it was  curbing her appetite , she stopped medication Dec 2020 when  insurance stopped covering the cost .  We tried her on Saxenda May 2021, she did not noticed any improvement of symptoms it was actually causing her appetite to increase . She did not lose any weight. We sent rx for Qsymia but required PA, and never went throught. She was seen in May 2022 and weight was 250 lbs, we started her on Contrave weigh was down to 245 lbs after 6 weeks, however it stopped working, we gave her Reginal Lutes but there was a gap in therapy due to [employer] , she has been taking Wegovy 0.5 mg since  Dec 2023, weight In Feb was 354 lbs and today is down to 336 lbs . We will adjust dose to 1 mg now  Depression Major: she has a long history of depression got worse when she lost her daughter at  4 yo from a MVA March 2019 , her grandchildren are out of the house both 22 yo and is now raising her niece that just finished HS.She is worried about her  father that has dementia   LBBB: she does not have any problems at this time and not interested on going to cardiologist at this time Unchanged   OA both knee  she was seen by Ortho - Dr. Hyacinth Meeker - she is going to the gym, doing the elliptical machine , she states losing weight has improved her pain. Current pain is zero , after moderate activity she has some pain up to 5-6/10 but does not  last long after rest   Patient Active Problem List   Diagnosis Date Noted   History of colonic polyps 11/29/2022   Polyp of ascending colon 11/29/2022   Primary osteoarthritis of both knees 12/17/2021   Morbid obesity with BMI of 50.0-59.9, adult (HCC) 12/17/2021   Major depression in remission (HCC) 12/17/2021   Benign neoplasm of ascending colon    Benign neoplasm of sigmoid colon    Benign essential HTN 10/25/2014   Bursitis of shoulder 10/25/2014   Insomnia, persistent 10/25/2014   Dyslipidemia 10/25/2014   Female stress incontinence 10/25/2014   Eczema intertrigo 10/25/2014   Menopause 10/25/2014   Dysmetabolic syndrome 10/25/2014   OSA on CPAP 10/25/2014   Vitamin D deficiency 10/25/2014    Past Surgical History:  Procedure Laterality Date  COLONOSCOPY WITH PROPOFOL N/A 02/17/2015   Procedure: COLONOSCOPY WITH PROPOFOL;  Surgeon: [name], MD;  Location: [facility] ENDOSCOPY;  Service: Endoscopy;  Laterality: N/A;   COLONOSCOPY WITH PROPOFOL N/A 11/29/2022   Procedure: COLONOSCOPY WITH PROPOFOL;  Surgeon: [name], MD;  Location: [facility] ENDOSCOPY;  Service: Endoscopy;  Laterality: N/A;   HOT HEMOSTASIS  11/29/2022   Procedure: HOT HEMOSTASIS (ARGON PLASMA COAGULATION/BICAP);  Surgeon: [name], MD;  Location: [facility] ENDOSCOPY;  Service: Endoscopy;;   POLYPECTOMY  11/29/2022   Procedure: POLYPECTOMY;  Surgeon: [name], MD;  Location: [facility] ENDOSCOPY;  Service: Endoscopy;;   TUBAL LIGATION      Family History  Problem Relation Age of Onset   Diabetes Mother    Hypertension Mother     Hyperlipidemia Mother    Multiple sclerosis Brother        03-28-11 passed away   Diabetes Brother    Breast cancer Neg Hx     Social History   Tobacco Use   Smoking status: Former    Current packs/day: 0.00    Average packs/day: 0.5 packs/day for 30.7 years (15.3 ttl pk-yrs)    Types: Cigarettes    Start date: 04/19/1975    Quit date: 12/17/2005    Years since quitting: 17.0   Smokeless tobacco: Never  Substance Use Topics   Alcohol use: No    Alcohol/week: 0.0 standard drinks of alcohol     Current Outpatient Medications:    acetaminophen (TYLENOL) 500 MG tablet, Take 1 tablet (500 mg total) by mouth every 8 (eight) hours as needed., Disp: 90 tablet, Rfl: 0   amLODipine-benazepril (LOTREL) 5-20 MG capsule, Take 1 capsule by mouth daily., Disp: 90 capsule, Rfl: 1   Ascorbic Acid (VITAMIN C) 100 MG tablet, Take 100 mg by mouth daily., Disp: , Rfl:    Cyanocobalamin (B-12) 1000 MCG SUBL, Place 1 each under the tongue daily. M-F, Disp: , Rfl:    desonide (DESOWEN) 0.05 % cream, Apply topically 2 (two) times daily., Disp: 30 g, Rfl: 0   diclofenac Sodium (VOLTAREN) 1 % GEL, Apply 4 g topically 4 (four) times daily., Disp: 300 g, Rfl: 1   hydrochlorothiazide (HYDRODIURIL) 12.5 MG tablet, Take 1 tablet (12.5 mg total) by mouth daily., Disp: 90 tablet, Rfl: 1   magnesium 30 MG tablet, Take 30 mg by mouth 2 (two) times daily., Disp: , Rfl:    meloxicam (MOBIC) 7.5 MG tablet, Take 7.5 mg by mouth daily., Disp: , Rfl:    rosuvastatin (CRESTOR) 20 MG tablet, TAKE 1 TABLET BY MOUTH DAILY, Disp: 90 tablet, Rfl: 0   Semaglutide-Weight Management (WEGOVY) 1 MG/0.5ML SOAJ, Inject 1 mg into the skin once a week., Disp: 6 mL, Rfl: 0   traZODone (DESYREL) 100 MG tablet, Take 1 tablet (100 mg total) by mouth at bedtime., Disp: 90 tablet, Rfl: 1   Vitamin D, Cholecalciferol, 25 MCG (1000 UT) TABS, Take 1 tablet by mouth daily., Disp: 30 tablet, Rfl: 0   zinc gluconate 50 MG tablet, Take 50 mg by mouth  daily., Disp: , Rfl:   No Known Allergies  I personally reviewed active problem list, medication list, allergies, family history, social history, health maintenance with the patient/caregiver today.   ROS  ***  Objective  There were no vitals filed for this visit.  There is no height or weight on file to calculate BMI.  Physical Exam ***  No results found for this or any previous visit (from the past 03-28-2159 hour(s)).  PHQ2/9:    10/04/2022    3:10 PM 06/28/2022    3:23 PM 05/24/2022    3:26 PM 12/17/2021    2:41 PM 08/16/2021   11:14 AM  Depression screen PHQ 2/9  Decreased Interest 0 0 0 0 0  Down, Depressed, Hopeless 0 0 0 0 0  PHQ - 2 Score 0 0 0 0 0  Altered sleeping 0 1 1 0 1  Tired, decreased energy 0 1 1 0 1  Change in appetite 0 0 0 0 0  Feeling bad or failure about yourself  0 0 0 0 0  Trouble concentrating 0 0 0 0 0  Moving slowly or fidgety/restless 0 0 0 0 0  Suicidal thoughts 0 0 0 0 0  PHQ-9 Score 0 2 2 0 2  Difficult doing work/chores  Not difficult at all Not difficult at all Not difficult at all     phq 9 is {gen pos [id number]}   Fall Risk:    10/04/2022    3:06 PM 06/28/2022    3:23 PM 05/24/2022    3:25 PM 12/17/2021    2:41 PM 08/16/2021   11:14 AM  Fall Risk   Falls in the past year? 0 0 0 0 0  Number falls in past yr:    0 0  Injury with Fall?    0 0  Risk for fall due to : No Fall Risks No Fall Risks No Fall Risks No Fall Risks No Fall Risks  Follow up Falls prevention discussed Falls prevention discussed Falls prevention discussed;Education provided;Falls evaluation completed Falls prevention discussed;Education provided Falls prevention discussed      Functional Status Survey:      Assessment & Plan  *** There are no diagnoses linked to this encounter.

## 2023-01-04 ENCOUNTER — Ambulatory Visit: Payer: 59 | Admitting: Family Medicine

## 2023-03-05 ENCOUNTER — Other Ambulatory Visit: Payer: Self-pay | Admitting: Family Medicine

## 2023-03-05 DIAGNOSIS — G4700 Insomnia, unspecified: Secondary | ICD-10-CM

## 2023-03-05 DIAGNOSIS — E785 Hyperlipidemia, unspecified: Secondary | ICD-10-CM

## 2023-03-05 DIAGNOSIS — I10 Essential (primary) hypertension: Secondary | ICD-10-CM

## 2023-04-26 ENCOUNTER — Ambulatory Visit: Payer: 59 | Admitting: Family Medicine

## 2023-04-26 ENCOUNTER — Telehealth: Payer: Self-pay

## 2023-04-26 ENCOUNTER — Encounter: Payer: Self-pay | Admitting: Family Medicine

## 2023-04-26 VITALS — BP 142/80 | HR 78 | Temp 99.2°F | Resp 16 | Ht 66.5 in | Wt 331.2 lb

## 2023-04-26 DIAGNOSIS — F325 Major depressive disorder, single episode, in full remission: Secondary | ICD-10-CM | POA: Diagnosis not present

## 2023-04-26 DIAGNOSIS — E559 Vitamin D deficiency, unspecified: Secondary | ICD-10-CM

## 2023-04-26 DIAGNOSIS — I10 Essential (primary) hypertension: Secondary | ICD-10-CM

## 2023-04-26 DIAGNOSIS — Z6841 Body Mass Index (BMI) 40.0 and over, adult: Secondary | ICD-10-CM

## 2023-04-26 DIAGNOSIS — R7303 Prediabetes: Secondary | ICD-10-CM

## 2023-04-26 DIAGNOSIS — E538 Deficiency of other specified B group vitamins: Secondary | ICD-10-CM

## 2023-04-26 DIAGNOSIS — G4733 Obstructive sleep apnea (adult) (pediatric): Secondary | ICD-10-CM | POA: Diagnosis not present

## 2023-04-26 DIAGNOSIS — G4700 Insomnia, unspecified: Secondary | ICD-10-CM

## 2023-04-26 DIAGNOSIS — E785 Hyperlipidemia, unspecified: Secondary | ICD-10-CM

## 2023-04-26 MED ORDER — ROSUVASTATIN CALCIUM 20 MG PO TABS
20.0000 mg | ORAL_TABLET | Freq: Every day | ORAL | 1 refills | Status: DC
Start: 2023-04-26 — End: 2023-10-12

## 2023-04-26 MED ORDER — AMLODIPINE BESY-BENAZEPRIL HCL 5-20 MG PO CAPS
1.0000 | ORAL_CAPSULE | Freq: Every day | ORAL | 1 refills | Status: DC
Start: 2023-04-26 — End: 2023-10-12

## 2023-04-26 MED ORDER — TRAZODONE HCL 100 MG PO TABS
100.0000 mg | ORAL_TABLET | Freq: Every day | ORAL | 1 refills | Status: DC
Start: 2023-04-26 — End: 2023-10-12

## 2023-04-26 MED ORDER — ZEPBOUND 5 MG/0.5ML ~~LOC~~ SOAJ
5.0000 mg | SUBCUTANEOUS | 0 refills | Status: DC
Start: 2023-04-26 — End: 2023-08-11

## 2023-04-26 MED ORDER — ZEPBOUND 2.5 MG/0.5ML ~~LOC~~ SOAJ
2.5000 mg | SUBCUTANEOUS | 0 refills | Status: DC
Start: 2023-04-26 — End: 2023-08-11

## 2023-04-26 MED ORDER — HYDROCHLOROTHIAZIDE 12.5 MG PO TABS
12.5000 mg | ORAL_TABLET | Freq: Every day | ORAL | 1 refills | Status: DC
Start: 2023-04-26 — End: 2023-10-12

## 2023-04-26 NOTE — Progress Notes (Signed)
 Name: Emma Oconnor   MRN: [id number]    DOB: 12-16-1955   Date:04/26/2023       Progress Note  Subjective  Chief Complaint  Chief Complaint  Patient presents with   Medical Management of Chronic Issues    HPI  HTN: taking bp medication Lotrel 5/20 at night , hydrochlorothiazide  12.5 mg in am She denies side effects. She denies  dizziness, headaches,  chest pain or palpitation. BP was a little elevated today    Pre-diabetes: losing weight and exercising , she was taking GLP 1 agonist but ran out of medications   Hyperlipidemia: she is now on Rosuvastatin  20 mg and LDL is down from 112 to 80, continue medications . We will recheck labs    Sciatica and right knee pain: she was having a lot of  tingling/prickly sensation going down her legs, we added Lyrica  but she was taking it prn, so we advised her to stop it    OSA: wearing CPAP every night, she takes Trazodone  to help her sleep . She states it is very old machine and would like new supplies. She states difficulty tolerating a whole face mask   Obesity: we started her on Contrave  mid August 2020, she states the day she started the medication her weight was 339 lbs, she states it was  curbing her appetite , she stopped medication Dec 2020 when  insurance stopped covering the cost .  We tried her on Saxenda  May 2021, she did not noticed any improvement of symptoms it was actually causing her appetite to increase . She did not lose any weight. We sent rx for Qsymia  but required PA, and never went throught. She was seen in May 2022 and weight was 250 lbs, we started her on Contrave  weigh was down to 245 lbs after 6 weeks, however it stopped working, we gave her Wegovy  but there was a gap in therapy due to [employer] , she has been taking Wegovy  0.5 mg since  Dec 2023, weight In Feb was 354 lbs and today is down to 336 lbs. She ran out of Wegovy  but weight is still trending down, she would like to resume medication , we will try Zepbound      Depression Major: she has a long history of depression got worse when she lost her daughter at  33 yo from a MVA March 2019 , her grandchildren are out of the house both 46 yo and is now raising her niece that just finished HS. She states he step father has dementia - he lives alone and she is thinking about moving him to her house . She is trying to be able to work from home . She is not taking medications    LBBB: she does not have any problems at this time and not interested on going to cardiologist at this time. Unchanged    OA both knee  she was seen by Ortho - Dr. Cleotilde - she is going to the gym, doing the elliptical machine Current pain is zero. Pain when standing and when walking for a long time, but not as severe now     Patient Active Problem List   Diagnosis Date Noted   History of colonic polyps 11/29/2022   Polyp of ascending colon 11/29/2022   Primary osteoarthritis of both knees 12/17/2021   Morbid obesity with BMI of 50.0-59.9, adult (HCC) 12/17/2021   Major depression in remission (HCC) 12/17/2021   Benign neoplasm of ascending colon  Benign neoplasm of sigmoid colon    Benign essential HTN 10/25/2014   Bursitis of shoulder 10/25/2014   Insomnia, persistent 10/25/2014   Dyslipidemia 10/25/2014   Female stress incontinence 10/25/2014   Eczema intertrigo 10/25/2014   Menopause 10/25/2014   Dysmetabolic syndrome 10/25/2014   OSA on CPAP 10/25/2014   Vitamin D  deficiency 10/25/2014    Past Surgical History:  Procedure Laterality Date   COLONOSCOPY WITH PROPOFOL  N/A 02/17/2015   Procedure: COLONOSCOPY WITH PROPOFOL ;  Surgeon: Rogelia Copping, MD;  Location: [facility] ENDOSCOPY;  Service: Endoscopy;  Laterality: N/A;   COLONOSCOPY WITH PROPOFOL  N/A 11/29/2022   Procedure: COLONOSCOPY WITH PROPOFOL ;  Surgeon: Copping Rogelia, MD;  Location: [facility] ENDOSCOPY;  Service: Endoscopy;  Laterality: N/A;   HOT HEMOSTASIS  11/29/2022   Procedure: HOT HEMOSTASIS (ARGON PLASMA  COAGULATION/BICAP);  Surgeon: Copping Rogelia, MD;  Location: [facility] ENDOSCOPY;  Service: Endoscopy;;   POLYPECTOMY  11/29/2022   Procedure: POLYPECTOMY;  Surgeon: Copping Rogelia, MD;  Location: [facility] ENDOSCOPY;  Service: Endoscopy;;   TUBAL LIGATION      Family History  Problem Relation Age of Onset   Diabetes Mother    Hypertension Mother    Hyperlipidemia Mother    Multiple sclerosis Brother        April 05, 2011 passed away   Diabetes Brother    Breast cancer Neg Hx     Social History   Tobacco Use   Smoking status: Former    Current packs/day: 0.00    Average packs/day: 0.5 packs/day for 30.7 years (15.3 ttl pk-yrs)    Types: Cigarettes    Start date: 04/19/1975    Quit date: 12/17/2005    Years since quitting: 17.3   Smokeless tobacco: Never  Substance Use Topics   Alcohol use: No    Alcohol/week: 0.0 standard drinks of alcohol     Current Outpatient Medications:    acetaminophen  (TYLENOL ) 500 MG tablet, Take 1 tablet (500 mg total) by mouth every 8 (eight) hours as needed., Disp: 90 tablet, Rfl: 0   Ascorbic Acid (VITAMIN C) 100 MG tablet, Take 100 mg by mouth daily., Disp: , Rfl:    Cyanocobalamin (B-12) 1000 MCG SUBL, Place 1 each under the tongue daily. M-F, Disp: , Rfl:    desonide  (DESOWEN ) 0.05 % cream, Apply topically 2 (two) times daily., Disp: 30 g, Rfl: 0   diclofenac  Sodium (VOLTAREN ) 1 % GEL, Apply 4 g topically 4 (four) times daily., Disp: 300 g, Rfl: 1   magnesium 30 MG tablet, Take 30 mg by mouth 2 (two) times daily., Disp: , Rfl:    meloxicam  (MOBIC ) 7.5 MG tablet, Take 7.5 mg by mouth daily., Disp: , Rfl:    tirzepatide  (ZEPBOUND ) 2.5 MG/0.5ML Pen, Inject 2.5 mg into the skin once a week., Disp: 2 mL, Rfl: 0   tirzepatide  (ZEPBOUND ) 5 MG/0.5ML Pen, Inject 5 mg into the skin once a week., Disp: 6 mL, Rfl: 0   Vitamin D , Cholecalciferol , 25 MCG (1000 UT) TABS, Take 1 tablet by mouth daily., Disp: 30 tablet, Rfl: 0   zinc gluconate 50 MG tablet, Take 50 mg by mouth daily.,  Disp: , Rfl:    amLODipine -benazepril  (LOTREL) 5-20 MG capsule, Take 1 capsule by mouth daily., Disp: 90 capsule, Rfl: 1   hydrochlorothiazide  (HYDRODIURIL ) 12.5 MG tablet, Take 1 tablet (12.5 mg total) by mouth daily., Disp: 90 tablet, Rfl: 1   rosuvastatin  (CRESTOR ) 20 MG tablet, Take 1 tablet (20 mg total) by mouth daily., Disp: 90 tablet, Rfl: 1  traZODone  (DESYREL ) 100 MG tablet, Take 1 tablet (100 mg total) by mouth at bedtime., Disp: 90 tablet, Rfl: 1  No Known Allergies  I personally reviewed active problem list, medication list, allergies, family history with the patient/caregiver today.   ROS  Constitutional: Negative for fever or weight change.  Respiratory: Negative for cough and shortness of breath.   Cardiovascular: Negative for chest pain or palpitations.  Gastrointestinal: Negative for abdominal pain, no bowel changes.  Musculoskeletal: Negative for gait problem or joint swelling.  Skin: Negative for rash.  Neurological: Negative for dizziness or headache.  No other specific complaints in a complete review of systems (except as listed in HPI above).   Objective  Vitals:   04/26/23 0906 04/26/23 0954  BP: (!) 148/78 (!) 142/80  Pulse: 78   Resp: 16   Temp: 99.2 F (37.3 C)   TempSrc: Oral   SpO2: 96%   Weight: (!) 331 lb 3.2 oz (150.2 kg)   Height: 5' 6.5 (1.689 m)     Body mass index is 52.66 kg/m.  Physical Exam  Constitutional: Patient appears well-developed and well-nourished. Obese  No distress.  HEENT: head atraumatic, normocephalic, pupils equal and reactive to light, neck supple Cardiovascular: Normal rate, regular rhythm and normal heart sounds.  No murmur heard. No BLE edema. Pulmonary/Chest: Effort normal and breath sounds normal. No respiratory distress. Abdominal: Soft.  There is no tenderness. Psychiatric: Patient has a normal mood and affect. behavior is normal. Judgment and thought content normal.   Diabetic Foot Exam:      PHQ2/9:    04/26/2023    9:06 AM 10/04/2022    3:10 PM 06/28/2022    3:23 PM 05/24/2022    3:26 PM 12/17/2021    2:41 PM  Depression screen PHQ 2/9  Decreased Interest 0 0 0 0 0  Down, Depressed, Hopeless 0 0 0 0 0  PHQ - 2 Score 0 0 0 0 0  Altered sleeping 0 0 1 1 0  Tired, decreased energy 0 0 1 1 0  Change in appetite 0 0 0 0 0  Feeling bad or failure about yourself  0 0 0 0 0  Trouble concentrating 0 0 0 0 0  Moving slowly or fidgety/restless 0 0 0 0 0  Suicidal thoughts 0 0 0 0 0  PHQ-9 Score 0 0 2 2 0  Difficult doing work/chores Not difficult at all  Not difficult at all Not difficult at all Not difficult at all    phq 9 is negative   Fall Risk:    04/26/2023    9:02 AM 10/04/2022    3:06 PM 06/28/2022    3:23 PM 05/24/2022    3:25 PM 12/17/2021    2:41 PM  Fall Risk   Falls in the past year? 0 0 0 0 0  Number falls in past yr: 0    0  Injury with Fall? 0    0  Risk for fall due to : No Fall Risks No Fall Risks No Fall Risks No Fall Risks No Fall Risks  Follow up Falls prevention discussed;Education provided;Falls evaluation completed Falls prevention discussed Falls prevention discussed Falls prevention discussed;Education provided;Falls evaluation completed Falls prevention discussed;Education provided     Assessment & Plan   1. Major depression in remission (HCC) (Primary)  She is not taking medication, she is under stress but coping well at this time  2. Morbid obesity with BMI of 50.0-59.9, adult (HCC)  - tirzepatide  (ZEPBOUND )  2.5 MG/0.5ML Pen; Inject 2.5 mg into the skin once a week.  Dispense: 2 mL; Refill: 0 - tirzepatide  (ZEPBOUND ) 5 MG/0.5ML Pen; Inject 5 mg into the skin once a week.  Dispense: 6 mL; Refill: 0  3. OSA on CPAP  We will try a titration study   4. Benign essential HTN  - CBC with Differential/Platelet - Comprehensive metabolic panel - amLODipine -benazepril  (LOTREL) 5-20 MG capsule; Take 1 capsule by mouth daily.  Dispense: 90 capsule;  Refill: 1 - hydrochlorothiazide  (HYDRODIURIL ) 12.5 MG tablet; Take 1 tablet (12.5 mg total) by mouth daily.  Dispense: 90 tablet; Refill: 1  5. Dyslipidemia  - Lipid panel - rosuvastatin  (CRESTOR ) 20 MG tablet; Take 1 tablet (20 mg total) by mouth daily.  Dispense: 90 tablet; Refill: 1  6. Insomnia, persistent  - traZODone  (DESYREL ) 100 MG tablet; Take 1 tablet (100 mg total) by mouth at bedtime.  Dispense: 90 tablet; Refill: 1  7. B12 deficiency  - B12 and Folate Panel  8. Vitamin D  deficiency  - VITAMIN D  25 Hydroxy (Vit-D Deficiency, Fractures)  9. Pre-diabetes  - Hemoglobin A1c

## 2023-04-26 NOTE — Telephone Encounter (Signed)
 No answer from pt, calling to verify if patient insurance from 2023 is still active for this year?

## 2023-04-28 NOTE — Telephone Encounter (Signed)
 Pt is calling back to report that she still has [facility] but a new policy number. Pt unable to upload into Mychart. Pt will fax the new insurance card this morning. Please advise when it has been received. CB- [phone]

## 2023-05-01 ENCOUNTER — Other Ambulatory Visit: Payer: Self-pay

## 2023-05-01 DIAGNOSIS — G4733 Obstructive sleep apnea (adult) (pediatric): Secondary | ICD-10-CM

## 2023-05-01 NOTE — Telephone Encounter (Signed)
 Have not received new insurance information yet

## 2023-05-27 LAB — CBC WITH DIFFERENTIAL/PLATELET
Basophils Absolute: 0 10*3/uL (ref 0.0–0.2)
Basos: 0 %
EOS (ABSOLUTE): 0.1 10*3/uL (ref 0.0–0.4)
Eos: 2 %
Hematocrit: 43.9 % (ref 34.0–46.6)
Hemoglobin: 14.4 g/dL (ref 11.1–15.9)
Immature Grans (Abs): 0 10*3/uL (ref 0.0–0.1)
Immature Granulocytes: 0 %
Lymphocytes Absolute: 1.8 10*3/uL (ref 0.7–3.1)
Lymphs: 31 %
MCH: 29.4 pg (ref 26.6–33.0)
MCHC: 32.8 g/dL (ref 31.5–35.7)
MCV: 90 fL (ref 79–97)
Monocytes Absolute: 0.5 10*3/uL (ref 0.1–0.9)
Monocytes: 9 %
Neutrophils Absolute: 3.4 10*3/uL (ref 1.4–7.0)
Neutrophils: 58 %
Platelets: 183 10*3/uL (ref 150–450)
RBC: 4.89 x10E6/uL (ref 3.77–5.28)
RDW: 14 % (ref 11.7–15.4)
WBC: 5.8 10*3/uL (ref 3.4–10.8)

## 2023-05-27 LAB — COMPREHENSIVE METABOLIC PANEL
ALT: 17 [IU]/L (ref 0–32)
AST: 21 [IU]/L (ref 0–40)
Albumin: 4.2 g/dL (ref 3.9–4.9)
Alkaline Phosphatase: 104 [IU]/L (ref 44–121)
BUN/Creatinine Ratio: 13 (ref 12–28)
BUN: 11 mg/dL (ref 8–27)
Bilirubin Total: 0.3 mg/dL (ref 0.0–1.2)
CO2: 24 mmol/L (ref 20–29)
Calcium: 9.8 mg/dL (ref 8.7–10.3)
Chloride: 102 mmol/L (ref 96–106)
Creatinine, Ser: 0.87 mg/dL (ref 0.57–1.00)
Globulin, Total: 2.8 g/dL (ref 1.5–4.5)
Glucose: 83 mg/dL (ref 70–99)
Potassium: 4 mmol/L (ref 3.5–5.2)
Sodium: 143 mmol/L (ref 134–144)
Total Protein: 7 g/dL (ref 6.0–8.5)
eGFR: 74 mL/min/{1.73_m2} (ref >59)

## 2023-05-27 LAB — LIPID PANEL
Chol/HDL Ratio: 2.5 {ratio} (ref 0.0–4.4)
Cholesterol, Total: 170 mg/dL (ref 100–199)
HDL: 69 mg/dL (ref >39)
LDL Chol Calc (NIH): 83 mg/dL (ref 0–99)
Triglycerides: 101 mg/dL (ref 0–149)
VLDL Cholesterol Cal: 18 mg/dL (ref 5–40)

## 2023-05-27 LAB — B12 AND FOLATE PANEL
Folate: 19.2 ng/mL (ref >3.0)
Vitamin B-12: 1172 pg/mL (ref 232–1245)

## 2023-05-27 LAB — HEMOGLOBIN A1C
Est. average glucose Bld gHb Est-mCnc: 120 mg/dL
Hgb A1c MFr Bld: 5.8 % — ABNORMAL HIGH (ref 4.8–5.6)

## 2023-05-27 LAB — VITAMIN D 25 HYDROXY (VIT D DEFICIENCY, FRACTURES): Vit D, 25-Hydroxy: 40.5 ng/mL (ref 30.0–100.0)

## 2023-05-29 ENCOUNTER — Encounter: Payer: Self-pay | Admitting: Family Medicine

## 2023-05-30 ENCOUNTER — Encounter: Payer: Self-pay | Admitting: Family Medicine

## 2023-05-30 ENCOUNTER — Ambulatory Visit: Payer: 59 | Admitting: Family Medicine

## 2023-05-30 VITALS — BP 136/74 | HR 81 | Resp 16 | Ht 66.5 in | Wt 325.7 lb

## 2023-05-30 DIAGNOSIS — E2839 Other primary ovarian failure: Secondary | ICD-10-CM | POA: Diagnosis not present

## 2023-05-30 DIAGNOSIS — Z0001 Encounter for general adult medical examination with abnormal findings: Secondary | ICD-10-CM

## 2023-05-30 DIAGNOSIS — Z13820 Encounter for screening for osteoporosis: Secondary | ICD-10-CM

## 2023-05-30 DIAGNOSIS — Z0000 Encounter for general adult medical examination without abnormal findings: Secondary | ICD-10-CM

## 2023-05-30 DIAGNOSIS — Z1231 Encounter for screening mammogram for malignant neoplasm of breast: Secondary | ICD-10-CM

## 2023-05-30 NOTE — Progress Notes (Unsigned)
Name: Emma Oconnor   MRN: [id number]    DOB: 08-18-1955   Date:05/30/2023       Progress Note  Subjective  Chief Complaint  Chief Complaint  Patient presents with   Annual Exam    HPI  Patient presents for annual CPE.  Diet: started Zepbound in January 2025, eating smaller portions, focusing on healthier diet such as fruit, vegetables and protein, being mindful when she eats fries Exercise: discussed importance of regular activity   Last Eye Exam: completed Last Dental Exam: completed  Flowsheet Row [facility] Visit [facility]/30/20[facility] in [facility]  AUDIT-C Score 0      Depression: Phq 9 is  negative    05/30/2023    2:54 PM 04/26/2023    9:06 AM 10/04/2022    3:10 PM 06/28/2022    3:23 PM 05/24/2022    3:26 PM  Depression screen PHQ 2/9  Decreased Interest 0 0 0 0 0  Down, Depressed, Hopeless 0 0 0 0 0  PHQ - 2 Score 0 0 0 0 0  Altered sleeping 0 0 0 1 1  Tired, decreased energy 0 0 0 1 1  Change in appetite 0 0 0 0 0  Feeling bad or failure about yourself  0 0 0 0 0  Trouble concentrating 0 0 0 0 0  Moving slowly or fidgety/restless 0 0 0 0 0  Suicidal thoughts 0 0 0 0 0  PHQ-9 Score 0 0 0 2 2  Difficult doing work/chores Not difficult at all Not difficult at all  Not difficult at all Not difficult at all   Hypertension: BP Readings from Last 3 Encounters:  05/30/23 136/74  04/26/23 (!) 142/80  11/29/22 119/84   Obesity: Wt Readings from Last 3 Encounters:  05/30/23 (!) 325 lb 11.2 oz (147.7 kg)  04/26/23 (!) 331 lb 3.2 oz (150.2 kg)  11/29/22 (!) 325 lb (147.4 kg)   BMI Readings from Last 3 Encounters:  05/30/23 51.78 kg/m  04/26/23 52.66 kg/m  11/29/22 51.67 kg/m     Vaccines: reviewed with the patient.   Hep C Screening: completed STD testing and prevention (HIV/chl/gon/syphilis): N/A Intimate partner violence: negative screen  Sexual History : not sexually active  Menstrual History/LMP/Abnormal Bleeding: post menopausal   Discussed importance of follow up if any post-menopausal bleeding: yes  Incontinence Symptoms: positive for symptoms - mild stress incontinence   Breast cancer:  - Last Mammogram: up to date - BRCA gene screening: N/A  Osteoporosis Prevention : Discussed high calcium and vitamin D supplementation, weight bearing exercises Bone density :we will recheck it  during her mammogram   Cervical cancer screening: up-to-date  Skin cancer: Discussed monitoring for atypical lesions  Colorectal cancer: repeat in 2027   Lung cancer:  Low Dose CT Chest recommended if Age 23-80 years, 20 pack-year currently smoking OR have quit w/in 15years. Patient does not qualify for screen   ECG: 2020  Advanced Care Planning: A voluntary discussion about advance care planning including the explanation and discussion of advance directives.  Discussed health care proxy and Living will, and the patient was able to identify a health care proxy as husband.  Patient does have a living will and power of attorney of health care   Patient Active Problem List   Diagnosis Date Noted   History of colonic polyps 11/29/2022   Polyp of ascending colon 11/29/2022   Primary osteoarthritis of both knees 12/17/2021   Morbid obesity with BMI of  50.0-59.9, adult (HCC) 12/17/2021   Major depression in remission (HCC) 12/17/2021   Benign neoplasm of ascending colon    Benign neoplasm of sigmoid colon    Benign essential HTN 10/25/2014   Bursitis of shoulder 10/25/2014   Insomnia, persistent 10/25/2014   Dyslipidemia 10/25/2014   Female stress incontinence 10/25/2014   Eczema intertrigo 10/25/2014   Menopause 10/25/2014   Dysmetabolic syndrome 10/25/2014   OSA on CPAP 10/25/2014   Vitamin D deficiency 10/25/2014    Past Surgical History:  Procedure Laterality Date   COLONOSCOPY WITH PROPOFOL N/A 02/17/2015   Procedure: COLONOSCOPY WITH PROPOFOL;  Surgeon: [name], MD;  Location: [facility] ENDOSCOPY;  Service: Endoscopy;   Laterality: N/A;   COLONOSCOPY WITH PROPOFOL N/A 11/29/2022   Procedure: COLONOSCOPY WITH PROPOFOL;  Surgeon: [name], MD;  Location: [facility] ENDOSCOPY;  Service: Endoscopy;  Laterality: N/A;   HOT HEMOSTASIS  11/29/2022   Procedure: HOT HEMOSTASIS (ARGON PLASMA COAGULATION/BICAP);  Surgeon: [name], MD;  Location: [facility] ENDOSCOPY;  Service: Endoscopy;;   POLYPECTOMY  11/29/2022   Procedure: POLYPECTOMY;  Surgeon: [name], MD;  Location: [facility] ENDOSCOPY;  Service: Endoscopy;;   TUBAL LIGATION      Family History  Problem Relation Age of Onset   Diabetes Mother    Hypertension Mother    Hyperlipidemia Mother    Multiple sclerosis Brother        2010/06/18 passed away   Diabetes Brother    Breast cancer Neg Hx     Social History   Socioeconomic History   Marital status: Married    Spouse name: [name]   Number of children: 3   Years of education: Not on file   Highest education level: High school graduate  Occupational History   Not on file  Tobacco Use   Smoking status: Former    Current packs/day: 0.00    Average packs/day: 0.5 packs/day for 30.7 years (15.3 ttl pk-yrs)    Types: Cigarettes    Start date: 04/19/1975    Quit date: 12/17/2005    Years since quitting: 17.4   Smokeless tobacco: Never  Vaping Use   Vaping status: Never Used  Substance and Sexual Activity   Alcohol use: No    Alcohol/week: 0.0 standard drinks of alcohol   Drug use: No   Sexual activity: Not Currently    Partners: Male  Other Topics Concern   Not on file  Social History Narrative   Married.   She is taking online classes trying to finish her Psychology degree with forensics    She had 3 children, one of her daughter's died 03-31-19from MVA and currently raised her 3 children ( two girls and one boy ) and also lost her niece May 2019 and is raising her teenage daughter.    Social Drivers of Corporate investment banker Strain: Low Risk  (05/30/2023)   Overall Financial Resource Strain  (CARDIA)    Difficulty of Paying Living Expenses: Not hard at all  Food Insecurity: No Food Insecurity (05/30/2023)   Hunger Vital Sign    Worried About Running Out of Food in the Last Year: Never true    Ran Out of Food in the Last Year: Never true  Transportation Needs: No Transportation Needs (05/30/2023)   PRAPARE - Administrator, Civil Service (Medical): No    Lack of Transportation (Non-Medical): No  Physical Activity: Insufficiently Active (05/30/2023)   Exercise Vital Sign    Days of Exercise per Week:  5 days    Minutes of Exercise per Session: 20 min  Stress: No Stress Concern Present (05/30/2023)   [employer] of Occupational Health - Occupational Stress Questionnaire    Feeling of Stress : Only a little  Social Connections: Socially Integrated (05/30/2023)   Social Connection and Isolation Panel [NHANES]    Frequency of Communication with Friends and Family: More than three times a week    Frequency of Social Gatherings with Friends and Family: More than three times a week    Attends Religious Services: More than 4 times per year    Active Member of [employer] or Organizations: Yes    Attends Banker Meetings: 1 to 4 times per year    Marital Status: Married  Catering manager Violence: Not At Risk (05/30/2023)   Humiliation, Afraid, Rape, and Kick questionnaire    Fear of Current or Ex-Partner: No    Emotionally Abused: No    Physically Abused: No    Sexually Abused: No     Current Outpatient Medications:    acetaminophen (TYLENOL) 500 MG tablet, Take 1 tablet (500 mg total) by mouth every 8 (eight) hours as needed., Disp: 90 tablet, Rfl: 0   amLODipine-benazepril (LOTREL) 5-20 MG capsule, Take 1 capsule by mouth daily., Disp: 90 capsule, Rfl: 1   Ascorbic Acid (VITAMIN C) 100 MG tablet, Take 100 mg by mouth daily., Disp: , Rfl:    Cyanocobalamin (B-12) 1000 MCG SUBL, Place 1 each under the tongue daily. M-F, Disp: , Rfl:    desonide (DESOWEN)  0.05 % cream, Apply topically 2 (two) times daily., Disp: 30 g, Rfl: 0   diclofenac Sodium (VOLTAREN) 1 % GEL, Apply 4 g topically 4 (four) times daily., Disp: 300 g, Rfl: 1   hydrochlorothiazide (HYDRODIURIL) 12.5 MG tablet, Take 1 tablet (12.5 mg total) by mouth daily., Disp: 90 tablet, Rfl: 1   magnesium 30 MG tablet, Take 30 mg by mouth 2 (two) times daily., Disp: , Rfl:    meloxicam (MOBIC) 7.5 MG tablet, Take 7.5 mg by mouth daily., Disp: , Rfl:    rosuvastatin (CRESTOR) 20 MG tablet, Take 1 tablet (20 mg total) by mouth daily., Disp: 90 tablet, Rfl: 1   tirzepatide (ZEPBOUND) 2.5 MG/0.5ML Pen, Inject 2.5 mg into the skin once a week., Disp: 2 mL, Rfl: 0   tirzepatide (ZEPBOUND) 5 MG/0.5ML Pen, Inject 5 mg into the skin once a week., Disp: 6 mL, Rfl: 0   traZODone (DESYREL) 100 MG tablet, Take 1 tablet (100 mg total) by mouth at bedtime., Disp: 90 tablet, Rfl: 1   Vitamin D, Cholecalciferol, 25 MCG (1000 UT) TABS, Take 1 tablet by mouth daily., Disp: 30 tablet, Rfl: 0   zinc gluconate 50 MG tablet, Take 50 mg by mouth daily., Disp: , Rfl:   No Known Allergies   ROS  Ten systems reviewed and is negative except as mentioned in HPI    Objective  Vitals:   05/30/23 1456  BP: 136/74  Pulse: 81  Resp: 16  SpO2: 97%  Weight: (!) 325 lb 11.2 oz (147.7 kg)  Height: 5' 6.5" (1.689 m)    Body mass index is 51.78 kg/m.  Physical Exam  Constitutional: Patient appears well-developed and well-nourished. No distress.  HENT: Head: Normocephalic and atraumatic. Ears: B TMs ok, no erythema or effusion; Nose: Nose normal. Mouth/Throat: Oropharynx is clear and moist. No oropharyngeal exudate.  Eyes: Conjunctivae and EOM are normal. Pupils are equal, round, and reactive to light.  No scleral icterus.  Neck: Normal range of motion. Neck supple. No JVD present. No thyromegaly present.  Cardiovascular: Normal rate, regular rhythm and normal heart sounds.  No murmur heard. No BLE  edema. Pulmonary/Chest: Effort normal and breath sounds normal. No respiratory distress. Abdominal: Soft. Bowel sounds are normal, no distension. There is no tenderness. no masses Breast: no lumps or masses, no nipple discharge or rashes FEMALE GENITALIA:  Not done  RECTAL: not done  Musculoskeletal: Normal range of motion, no joint effusions. No gross deformities Neurological: he is alert and oriented to person, place, and time. No cranial nerve deficit. Coordination, balance, strength, speech and gait are normal.  Skin: Skin is warm and dry. No rash noted. No erythema.  Psychiatric: Patient has a normal mood and affect. behavior is normal. Judgment and thought content normal.   {Show previous labs (optional):23779}  Assessment & Plan  1. Well adult exam (Primary)  - MM 3D SCREENING MAMMOGRAM BILATERAL BREAST; Future  Consider PCV 20, shingrix and RSV  2. Ovarian failure  - DG Bone Density; Future  3. Osteoporosis screening  - DG Bone Density; Future  4. Screening mammogram for breast cancer  - MM 3D SCREENING MAMMOGRAM BILATERAL BREAST; Future   -USPSTF grade A and B recommendations reviewed with patient; age-appropriate recommendations, preventive care, screening tests, etc discussed and encouraged; healthy living encouraged; see AVS for patient education given to patient -Discussed importance of 150 minutes of physical activity weekly, eat two servings of fish weekly, eat one serving of tree nuts ( cashews, pistachios, pecans, almonds.Marland Kitchen) every other day, eat 6 servings of fruit/vegetables daily and drink plenty of water and avoid sweet beverages.   -Reviewed Health Maintenance: Yes.

## 2023-08-11 ENCOUNTER — Other Ambulatory Visit: Payer: Self-pay | Admitting: Family Medicine

## 2023-08-11 ENCOUNTER — Encounter: Payer: Self-pay | Admitting: Family Medicine

## 2023-08-11 MED ORDER — TIRZEPATIDE-WEIGHT MANAGEMENT 7.5 MG/0.5ML ~~LOC~~ SOLN: 7.5000 mg | SUBCUTANEOUS | 0 refills | Status: DC

## 2023-08-24 ENCOUNTER — Ambulatory Visit: Payer: Self-pay | Admitting: Family Medicine

## 2023-08-24 ENCOUNTER — Encounter: Payer: Self-pay | Admitting: Family Medicine

## 2023-08-24 VITALS — BP 136/82 | HR 93 | Resp 16 | Ht 66.5 in | Wt 312.7 lb

## 2023-08-24 DIAGNOSIS — M170 Bilateral primary osteoarthritis of knee: Secondary | ICD-10-CM

## 2023-08-24 DIAGNOSIS — Z6841 Body Mass Index (BMI) 40.0 and over, adult: Secondary | ICD-10-CM

## 2023-08-24 DIAGNOSIS — G4733 Obstructive sleep apnea (adult) (pediatric): Secondary | ICD-10-CM | POA: Diagnosis not present

## 2023-08-24 DIAGNOSIS — F325 Major depressive disorder, single episode, in full remission: Secondary | ICD-10-CM | POA: Diagnosis not present

## 2023-08-24 DIAGNOSIS — I10 Essential (primary) hypertension: Secondary | ICD-10-CM | POA: Diagnosis not present

## 2023-08-24 DIAGNOSIS — G4700 Insomnia, unspecified: Secondary | ICD-10-CM

## 2023-08-24 DIAGNOSIS — E538 Deficiency of other specified B group vitamins: Secondary | ICD-10-CM

## 2023-08-24 DIAGNOSIS — R7303 Prediabetes: Secondary | ICD-10-CM

## 2023-08-24 DIAGNOSIS — E559 Vitamin D deficiency, unspecified: Secondary | ICD-10-CM

## 2023-08-24 MED ORDER — TIRZEPATIDE-WEIGHT MANAGEMENT 10 MG/0.5ML ~~LOC~~ SOAJ
10.0000 mg | SUBCUTANEOUS | 0 refills | Status: DC
Start: 2023-08-24 — End: 2024-02-05

## 2023-08-24 NOTE — Progress Notes (Signed)
 Name: Emma Oconnor   MRN: [id number]    DOB: 1955/07/22   Date:08/24/2023       Progress Note  Subjective  Chief Complaint  Chief Complaint  Patient presents with   Medical Management of Chronic Issues   Discussed the use of AI scribe software for clinical note transcription with the patient, who gave verbal consent to proceed.  History of Present Illness Emma Oconnor is a 68 year old female who presents for a four-month follow-up visit.  She has hypertension, managed with amlodipine -benazepril  5/20 mg and hydrochlorothiazide  12.5 mg daily, resulting in improved blood pressure readings of 136/82 mmHg from 148 mmHg. No side effects from these medications and she does not monitor her blood pressure at home.  Regarding prediabetes and obesity, she is actively losing weight and engaging in physical activity. She is on a GLP-1 agonist, Zepbound , recently increased to 7.5 mg with plans to escalate to 10 mg. She is reducing carbohydrate intake and being mindful of her diet, though she still craves sweets. Her weight has decreased from 331 pounds in January to 312.7 pounds, with a BMI reduction from over 50 to below 50. She has a history of using weight loss medications like Contrave  and Wegovy , facing challenges with insurance coverage.  For hyperlipidemia, she takes atorvastatin  20 mg daily, with LDL cholesterol reduced to 83 mg/dL from 664 mg/dL. No muscle aches or side effects reported.  She has obstructive sleep apnea and uses a CPAP machine nightly. Despite scheduling issues preventing a recent sleep study, she notes improved sleep quality without the CPAP. She takes trazodone  for sleep without issues.  Her major depression, triggered by significant trauma in 26-Sep-2017 ( death of her daughter) , is currently well-managed. She is handling stress well, including caring for her stepfather, and her living situation has improved with her grandchildren moving out, reducing daily stressors.  She has  osteoarthritis in both knees and consults with Dr. Annabell Key. She has stopped using the elliptical due to knee pain but has increased walking, aiming for 10,000 steps a day and currently achieving about 6,500 steps daily.  She has a history of vitamin D  deficiency and takes supplements, with recent levels adequate. She inquired about biotin due to concerns about hair loss associated with weight loss.    Patient Active Problem List   Diagnosis Date Noted   History of colonic polyps 11/29/2022   Polyp of ascending colon 11/29/2022   Primary osteoarthritis of both knees 12/17/2021   Morbid obesity with BMI of 50.0-59.9, adult (HCC) 12/17/2021   Major depression in remission (HCC) 12/17/2021   Benign neoplasm of ascending colon    Benign neoplasm of sigmoid colon    Benign essential HTN 10/25/2014   Bursitis of shoulder 10/25/2014   Insomnia, persistent 10/25/2014   Dyslipidemia 10/25/2014   Female stress incontinence 10/25/2014   Eczema intertrigo 10/25/2014   Menopause 10/25/2014   Dysmetabolic syndrome 10/25/2014   OSA on CPAP 10/25/2014   Vitamin D  deficiency 10/25/2014    Past Surgical History:  Procedure Laterality Date   COLONOSCOPY WITH PROPOFOL  N/A 02/17/2015   Procedure: COLONOSCOPY WITH PROPOFOL ;  Surgeon: Marnee Sink, MD;  Location: [facility] ENDOSCOPY;  Service: Endoscopy;  Laterality: N/A;   COLONOSCOPY WITH PROPOFOL  N/A 11/29/2022   Procedure: COLONOSCOPY WITH PROPOFOL ;  Surgeon: Marnee Sink, MD;  Location: [facility] ENDOSCOPY;  Service: Endoscopy;  Laterality: N/A;   HOT HEMOSTASIS  11/29/2022   Procedure: HOT HEMOSTASIS (ARGON PLASMA COAGULATION/BICAP);  Surgeon: Marnee Sink, MD;  Location: [facility] ENDOSCOPY;  Service: Endoscopy;;   POLYPECTOMY  11/29/2022   Procedure: POLYPECTOMY;  Surgeon: Marnee Sink, MD;  Location: [facility] ENDOSCOPY;  Service: Endoscopy;;   TUBAL LIGATION      Family History  Problem Relation Age of Onset   Diabetes Mother    Hypertension Mother     Hyperlipidemia Mother    Multiple sclerosis Brother        09-17-2010 passed away   Diabetes Brother    Breast cancer Neg Hx     Social History   Tobacco Use   Smoking status: Former    Current packs/day: 0.00    Average packs/day: 0.5 packs/day for 30.7 years (15.3 ttl pk-yrs)    Types: Cigarettes    Start date: 04/19/1975    Quit date: 12/17/2005    Years since quitting: 17.6   Smokeless tobacco: Never  Substance Use Topics   Alcohol use: No    Alcohol/week: 0.0 standard drinks of alcohol     Current Outpatient Medications:    acetaminophen  (TYLENOL ) 500 MG tablet, Take 1 tablet (500 mg total) by mouth every 8 (eight) hours as needed., Disp: 90 tablet, Rfl: 0   amLODipine -benazepril  (LOTREL) 5-20 MG capsule, Take 1 capsule by mouth daily., Disp: 90 capsule, Rfl: 1   Ascorbic Acid (VITAMIN C) 100 MG tablet, Take 100 mg by mouth daily., Disp: , Rfl:    Cyanocobalamin (B-12) 1000 MCG SUBL, Place 1 each under the tongue daily. M-F, Disp: , Rfl:    desonide  (DESOWEN ) 0.05 % cream, Apply topically 2 (two) times daily., Disp: 30 g, Rfl: 0   diclofenac  Sodium (VOLTAREN ) 1 % GEL, Apply 4 g topically 4 (four) times daily., Disp: 300 g, Rfl: 1   hydrochlorothiazide  (HYDRODIURIL ) 12.5 MG tablet, Take 1 tablet (12.5 mg total) by mouth daily., Disp: 90 tablet, Rfl: 1   magnesium 30 MG tablet, Take 30 mg by mouth 2 (two) times daily., Disp: , Rfl:    meloxicam  (MOBIC ) 7.5 MG tablet, Take 7.5 mg by mouth daily., Disp: , Rfl:    rosuvastatin  (CRESTOR ) 20 MG tablet, Take 1 tablet (20 mg total) by mouth daily., Disp: 90 tablet, Rfl: 1   tirzepatide  7.5 MG/0.5ML injection vial, Inject 7.5 mg into the skin once a week., Disp: 6 mL, Rfl: 0   traZODone  (DESYREL ) 100 MG tablet, Take 1 tablet (100 mg total) by mouth at bedtime., Disp: 90 tablet, Rfl: 1   Vitamin D , Cholecalciferol , 25 MCG (1000 UT) TABS, Take 1 tablet by mouth daily., Disp: 30 tablet, Rfl: 0   zinc gluconate 50 MG tablet, Take 50 mg by mouth  daily., Disp: , Rfl:   No Known Allergies  I personally reviewed active problem list, medication list, allergies with the patient/caregiver today.   ROS  Ten systems reviewed and is negative except as mentioned in HPI    Objective Physical Exam  CONSTITUTIONAL: Patient appears well-developed and well-nourished. No distress. HEENT: Head atraumatic, normocephalic, neck supple. Neck normal on palpation. No thyromegaly. CARDIOVASCULAR: Normal rate, regular rhythm and normal heart sounds. No murmur heard. No BLE edema. PULMONARY: Effort normal and breath sounds normal. No respiratory distress. ABDOMINAL: There is no tenderness or distention. MUSCULOSKELETAL: Normal gait. Without gross motor or sensory deficit. PSYCHIATRIC: Patient has a normal mood and affect. Behavior is normal. Judgment and thought content normal.  Vitals:   08/24/23 0914  BP: 136/82  Pulse: 93  Resp: 16  SpO2: 100%  Weight: (!) 312 lb 11.2 oz (141.8 kg)  Height: 5' 6.5" (1.689 m)    Body mass index is 49.71 kg/m.  Recent Results (from the past 2160 hours)  Lipid panel     Status: None   Collection Time: 05/26/23  2:59 PM  Result Value Ref Range   Cholesterol, Total 170 100 - 199 mg/dL   Triglycerides 161 0 - 149 mg/dL   HDL 69 >09 mg/dL   VLDL Cholesterol Cal 18 5 - 40 mg/dL   LDL Chol Calc (NIH) 83 0 - 99 mg/dL   Chol/HDL Ratio 2.5 0.0 - 4.4 ratio    Comment:                                   T. Chol/HDL Ratio                                             Men  Women                               1/2 Avg.Risk  3.4    3.3                                   Avg.Risk  5.0    4.4                                2X Avg.Risk  9.6    7.1                                3X Avg.Risk 23.4   11.0   CBC with Differential/Platelet     Status: None   Collection Time: 05/26/23  2:59 PM  Result Value Ref Range   WBC 5.8 3.4 - 10.8 x10E3/uL   RBC 4.89 3.77 - 5.28 x10E6/uL   Hemoglobin 14.4 11.1 - 15.9 g/dL    Hematocrit 60.4 54.0 - 46.6 %   MCV 90 79 - 97 fL   MCH 29.4 26.6 - 33.0 pg   MCHC 32.8 31.5 - 35.7 g/dL   RDW 98.1 19.1 - 47.8 %   Platelets 183 150 - 450 x10E3/uL   Neutrophils 58 Not Estab. %   Lymphs 31 Not Estab. %   Monocytes 9 Not Estab. %   Eos 2 Not Estab. %   Basos 0 Not Estab. %   Neutrophils Absolute 3.4 1.4 - 7.0 x10E3/uL   Lymphocytes Absolute 1.8 0.7 - 3.1 x10E3/uL   Monocytes Absolute 0.5 0.1 - 0.9 x10E3/uL   EOS (ABSOLUTE) 0.1 0.0 - 0.4 x10E3/uL   Basophils Absolute 0.0 0.0 - 0.2 x10E3/uL   Immature Granulocytes 0 Not Estab. %   Immature Grans (Abs) 0.0 0.0 - 0.1 x10E3/uL  Comprehensive metabolic panel     Status: None   Collection Time: 05/26/23  2:59 PM  Result Value Ref Range   Glucose 83 70 - 99 mg/dL   BUN 11 8 - 27 mg/dL   Creatinine, Ser 2.95 0.57 - 1.00 mg/dL   eGFR 74 >62 ZH/YQM/5.78   BUN/Creatinine Ratio 13 12 - 28   Sodium  143 134 - 144 mmol/L   Potassium 4.0 3.5 - 5.2 mmol/L   Chloride 102 96 - 106 mmol/L   CO2 24 20 - 29 mmol/L   Calcium  9.8 8.7 - 10.3 mg/dL   Total Protein 7.0 6.0 - 8.5 g/dL   Albumin 4.2 3.9 - 4.9 g/dL   Globulin, Total 2.8 1.5 - 4.5 g/dL   Bilirubin Total 0.3 0.0 - 1.2 mg/dL   Alkaline Phosphatase 104 44 - 121 IU/L   AST 21 0 - 40 IU/L   ALT 17 0 - 32 IU/L  Hemoglobin A1c     Status: Abnormal   Collection Time: 05/26/23  2:59 PM  Result Value Ref Range   Hgb A1c MFr Bld 5.8 (H) 4.8 - 5.6 %    Comment:          Prediabetes: 5.7 - 6.4          Diabetes: >6.4          Glycemic control for adults with diabetes: <7.0    Est. average glucose Bld gHb Est-mCnc 120 mg/dL  VITAMIN D  25 Hydroxy (Vit-D Deficiency, Fractures)     Status: None   Collection Time: 05/26/23  2:59 PM  Result Value Ref Range   Vit D, 25-Hydroxy 40.5 30.0 - 100.0 ng/mL    Comment: Vitamin D  deficiency has been defined by the Institute of Medicine and an Endocrine Society practice guideline as a level of serum 25-OH vitamin D  less than 20 ng/mL  (1,2). The Endocrine Society went on to further define vitamin D  insufficiency as a level between 21 and 29 ng/mL (2). 1. IOM (Institute of Medicine). 2010. Dietary reference    intakes for calcium  and D. [place]: The    [employer]. 2. Holick MF, Binkley Sheffield, Bischoff-Ferrari HA, et al.    Evaluation, treatment, and prevention of vitamin D     deficiency: an Endocrine Society clinical practice    guideline. JCEM. 2011 Jul; 96(7):1911-30.   B12 and Folate Panel     Status: None   Collection Time: 05/26/23  2:59 PM  Result Value Ref Range   Vitamin B-12 1,172 232 - 1,245 pg/mL   Folate 19.2 >3.0 ng/mL    Comment: A serum folate concentration of less than 3.1 ng/mL is considered to represent clinical deficiency.       PHQ2/9:    08/24/2023    9:14 AM 05/30/2023    2:54 PM 04/26/2023    9:06 AM 10/04/2022    3:10 PM 06/28/2022    3:23 PM  Depression screen PHQ 2/9  Decreased Interest 0 0 0 0 0  Down, Depressed, Hopeless 0 0 0 0 0  PHQ - 2 Score 0 0 0 0 0  Altered sleeping  0 0 0 1  Tired, decreased energy  0 0 0 1  Change in appetite  0 0 0 0  Feeling bad or failure about yourself   0 0 0 0  Trouble concentrating  0 0 0 0  Moving slowly or fidgety/restless  0 0 0 0  Suicidal thoughts  0 0 0 0  PHQ-9 Score  0 0 0 2  Difficult doing work/chores  Not difficult at all Not difficult at all  Not difficult at all    phq 9 is negative  Fall Risk:    08/24/2023    9:11 AM 04/26/2023    9:02 AM 10/04/2022    3:06 PM 06/28/2022    3:23 PM 05/24/2022  3:25 PM  Fall Risk   Falls in the past year? 0 0 0 0 0  Number falls in past yr: 0 0     Injury with Fall? 0 0     Risk for fall due to : No Fall Risks No Fall Risks No Fall Risks No Fall Risks No Fall Risks  Follow up Falls prevention discussed;Education provided;Falls evaluation completed Falls prevention discussed;Education provided;Falls evaluation completed Falls prevention discussed Falls prevention discussed Falls  prevention discussed;Education provided;Falls evaluation completed     Assessment & Plan Obesity Morbid obesity with BMI over 50. Weight decreased from 331 to 312.7 pounds. Motivated to continue weight loss with dietary changes. Zepbound  aids in weight loss and insulin  resistance. - Continue Zepbound , increase dose to 10 mg at next prescription. - Send prescription to [facility] for better pricing. - Encourage continued dietary modifications and physical activity. - Schedule follow-up in 3 months to monitor weight loss and medication compliance.  Prediabetes Managing prediabetes with weight loss and Zepbound . Aware of GLP-1 agonists' benefits in improving insulin  resistance. - Continue Zepbound  as prescribed. - Encourage continued dietary modifications, focusing on reducing carbohydrate intake.  Hypertension Blood pressure improved to 136/82 mmHg. Current management with amlodipine , benazepril , and HCTZ is effective. - Continue current antihypertensive regimen (amlodipine , benazepril , HCTZ). - Monitor blood pressure at home if possible.  Hyperlipidemia On atorvastatin  20 mg, effectively reduced LDL cholesterol to 83 mg/dL. No side effects. Current management is effective. - Continue atorvastatin  20 mg daily.  Obstructive Sleep Apnea Uses CPAP machine regularly. Advised to continue using it until more weight is lost. - Continue using CPAP machine nightly. - Consider rescheduling sleep study after further weight loss.  Major Depression Managing stress better with a reduction in stressors. Coping well with current life changes.  Osteoarthritis of knees Osteoarthritis in both knees. Increased walking activity, aiming for 10,000 steps a day. - Encourage continued walking and gradual increase in daily steps.  Vitamin D  deficiency Taking vitamin D  supplements, levels are adequate. - Continue vitamin D  and B12 supplementation.  General Health Maintenance Inquired about biotin for  potential hair loss from weight loss. Explained biotin is not necessary if taking B12. - Consider topical minoxidil for hair loss if needed.

## 2023-10-12 ENCOUNTER — Other Ambulatory Visit: Payer: Self-pay | Admitting: Family Medicine

## 2023-10-12 DIAGNOSIS — G4700 Insomnia, unspecified: Secondary | ICD-10-CM

## 2023-10-12 DIAGNOSIS — I10 Essential (primary) hypertension: Secondary | ICD-10-CM

## 2023-10-12 DIAGNOSIS — E785 Hyperlipidemia, unspecified: Secondary | ICD-10-CM

## 2023-11-07 ENCOUNTER — Encounter: Payer: Self-pay | Admitting: Family Medicine

## 2023-11-17 ENCOUNTER — Ambulatory Visit
Admission: RE | Admit: 2023-11-17 | Discharge: 2023-11-17 | Disposition: A | Discharge status: Home / Self Care | Source: Ambulatory Visit | Attending: Family Medicine | Admitting: Family Medicine

## 2023-11-17 DIAGNOSIS — Z1231 Encounter for screening mammogram for malignant neoplasm of breast: Secondary | ICD-10-CM | POA: Diagnosis present

## 2023-11-17 DIAGNOSIS — Z0000 Encounter for general adult medical examination without abnormal findings: Secondary | ICD-10-CM

## 2023-11-22 ENCOUNTER — Ambulatory Visit: Payer: Self-pay | Admitting: Internal Medicine

## 2023-11-29 ENCOUNTER — Encounter: Payer: Self-pay | Admitting: Family Medicine

## 2023-11-29 ENCOUNTER — Ambulatory Visit: Admitting: Family Medicine

## 2023-11-29 VITALS — BP 126/74 | HR 79 | Resp 16 | Ht 66.5 in | Wt 312.2 lb

## 2023-11-29 DIAGNOSIS — R7303 Prediabetes: Secondary | ICD-10-CM | POA: Diagnosis not present

## 2023-11-29 DIAGNOSIS — M170 Bilateral primary osteoarthritis of knee: Secondary | ICD-10-CM

## 2023-11-29 DIAGNOSIS — E785 Hyperlipidemia, unspecified: Secondary | ICD-10-CM

## 2023-11-29 DIAGNOSIS — Z6841 Body Mass Index (BMI) 40.0 and over, adult: Secondary | ICD-10-CM

## 2023-11-29 DIAGNOSIS — I10 Essential (primary) hypertension: Secondary | ICD-10-CM | POA: Diagnosis not present

## 2023-11-29 DIAGNOSIS — G4733 Obstructive sleep apnea (adult) (pediatric): Secondary | ICD-10-CM

## 2023-11-29 DIAGNOSIS — E538 Deficiency of other specified B group vitamins: Secondary | ICD-10-CM | POA: Insufficient documentation

## 2023-11-29 NOTE — Progress Notes (Signed)
 Name: Emma Oconnor   MRN: [id number]    DOB: 09-Jan-1956   Date:11/29/2023       Progress Note  Subjective  Chief Complaint  Chief Complaint  Patient presents with   Medical Management of Chronic Issues   Discussed the use of AI scribe software for clinical note transcription with the patient, who gave verbal consent to proceed.  History of Present Illness Emma Oconnor is a 68 year old female who presents for weight management follow-up.  She has been on a weight loss journey since January, starting at 331 pounds and currently weighing 312.2 pounds. She began treatment with Zepbound , initially at 7.5 mg, and recently increased to 10 mg. Despite a recent plateau in weight loss, she feels good overall and is actively trying to be more physically active and mindful of her diet.   She has prediabetes and is focused on weight loss to help manage this condition. Her dietary changes include reducing sweet intake, opting for zero sugar drinks, and avoiding soda. She chooses water over sugary drinks at restaurants and is mindful of her food choices, questioning if she is 'doing the best that you can.'  Her typical breakfast includes yogurt or half a bagel with cream cheese, and she snacks on crackers mid-morning. She acknowledges needing to improve her protein intake, especially in the morning. For lunch, she often skips the meal due to lack of hunger after a mid-morning snack. She opts for fruit like an orange or banana if hungry in the afternoon.  She works in a [employer] set her watch to remind her to walk the perimeter three times, aiming for three to four times a day. She also walks once more before leaving work. This routine helps her stay active despite a busy schedule.  She has a history of vitamin D  deficiency, B12 deficiency, and dyslipidemia. She takes rosuvastatin  20 mg for dyslipidemia. She also takes supplements for vitamin D  and B12.  She has hypertension, managed with  amlodipine -benazepril  and HCTZ, with no reported side effects. No dizziness, chest pain, or palpitations.  She uses a CPAP machine for obstructive sleep apnea, though her machine is old. She reports no issues with the machine and is waiting to lose more weight before considering a new titration.  She experiences osteoarthritis in her knees, which sometimes causes pain if she sits too long. She uses topical treatments for relief and finds that movement helps alleviate discomfort.    Patient Active Problem List   Diagnosis Date Noted   History of colonic polyps 11/29/2022   Polyp of ascending colon 11/29/2022   Primary osteoarthritis of both knees 12/17/2021   Morbid obesity with BMI of 50.0-59.9, adult (HCC) 12/17/2021   Major depression in remission (HCC) 12/17/2021   Benign neoplasm of ascending colon    Benign neoplasm of sigmoid colon    Benign essential HTN 10/25/2014   Bursitis of shoulder 10/25/2014   Insomnia, persistent 10/25/2014   Dyslipidemia 10/25/2014   Female stress incontinence 10/25/2014   Eczema intertrigo 10/25/2014   Menopause 10/25/2014   Dysmetabolic syndrome 10/25/2014   OSA on CPAP 10/25/2014   Vitamin D  deficiency 10/25/2014    Past Surgical History:  Procedure Laterality Date   COLONOSCOPY WITH PROPOFOL  N/A 02/17/2015   Procedure: COLONOSCOPY WITH PROPOFOL ;  Surgeon: Rogelia Copping, MD;  Location: [facility] ENDOSCOPY;  Service: Endoscopy;  Laterality: N/A;   COLONOSCOPY WITH PROPOFOL  N/A 11/29/2022   Procedure: COLONOSCOPY WITH PROPOFOL ;  Surgeon: Copping Rogelia, MD;  Location:  [facility] ENDOSCOPY;  Service: Endoscopy;  Laterality: N/A;   HOT HEMOSTASIS  11/29/2022   Procedure: HOT HEMOSTASIS (ARGON PLASMA COAGULATION/BICAP);  Surgeon: Jinny Carmine, MD;  Location: [facility] ENDOSCOPY;  Service: Endoscopy;;   POLYPECTOMY  11/29/2022   Procedure: POLYPECTOMY;  Surgeon: Jinny Carmine, MD;  Location: [facility] ENDOSCOPY;  Service: Endoscopy;;   TUBAL LIGATION      Family History   Problem Relation Age of Onset   Diabetes Mother    Hypertension Mother    Hyperlipidemia Mother    Multiple sclerosis Brother        2010-12-21 passed away   Diabetes Brother    Breast cancer Neg Hx     Social History   Tobacco Use   Smoking status: Former    Current packs/day: 0.00    Average packs/day: 0.5 packs/day for 30.7 years (15.3 ttl pk-yrs)    Types: Cigarettes    Start date: 04/19/1975    Quit date: 12/17/2005    Years since quitting: 17.9   Smokeless tobacco: Never  Substance Use Topics   Alcohol use: No    Alcohol/week: 0.0 standard drinks of alcohol     Current Outpatient Medications:    acetaminophen  (TYLENOL ) 500 MG tablet, Take 1 tablet (500 mg total) by mouth every 8 (eight) hours as needed., Disp: 90 tablet, Rfl: 0   amLODipine -benazepril  (LOTREL) 5-20 MG capsule, TAKE 1 CAPSULE BY MOUTH DAILY, Disp: 90 capsule, Rfl: 0   Ascorbic Acid (VITAMIN C) 100 MG tablet, Take 100 mg by mouth daily., Disp: , Rfl:    Cyanocobalamin (B-12) 1000 MCG SUBL, Place 1 each under the tongue daily. M-F, Disp: , Rfl:    desonide  (DESOWEN ) 0.05 % cream, Apply topically 2 (two) times daily., Disp: 30 g, Rfl: 0   diclofenac  Sodium (VOLTAREN ) 1 % GEL, Apply 4 g topically 4 (four) times daily., Disp: 300 g, Rfl: 1   hydrochlorothiazide  (HYDRODIURIL ) 12.5 MG tablet, TAKE 1 TABLET BY MOUTH DAILY, Disp: 90 tablet, Rfl: 0   magnesium 30 MG tablet, Take 30 mg by mouth 2 (two) times daily., Disp: , Rfl:    meloxicam  (MOBIC ) 7.5 MG tablet, Take 7.5 mg by mouth daily., Disp: , Rfl:    rosuvastatin  (CRESTOR ) 20 MG tablet, TAKE 1 TABLET BY MOUTH DAILY, Disp: 90 tablet, Rfl: 0   tirzepatide  (ZEPBOUND ) 10 MG/0.5ML Pen, Inject 10 mg into the skin once a week., Disp: 6 mL, Rfl: 0   traZODone  (DESYREL ) 100 MG tablet, TAKE 1 TABLET BY MOUTH AT  BEDTIME, Disp: 90 tablet, Rfl: 0   Vitamin D , Cholecalciferol , 25 MCG (1000 UT) TABS, Take 1 tablet by mouth daily., Disp: 30 tablet, Rfl: 0   zinc gluconate 50 MG  tablet, Take 50 mg by mouth daily., Disp: , Rfl:   No Known Allergies  I personally reviewed active problem list, medication list, allergies, family history with the patient/caregiver today.   ROS  Ten systems reviewed and is negative except as mentioned in HPI    Objective Physical Exam  CONSTITUTIONAL: Patient appears well-developed and well-nourished.  No distress. HEENT: Head atraumatic, normocephalic, neck supple. CARDIOVASCULAR: Normal rate, regular rhythm and normal heart sounds.  No murmur heard. No BLE edema. PULMONARY: Effort normal and breath sounds normal. No respiratory distress. MUSCULOSKELETAL: Normal gait. Without gross motor or sensory deficit. PSYCHIATRIC: Patient has a normal mood and affect. behavior is normal. Judgment and thought content normal.  Vitals:   11/29/23 0924  BP: 126/74  Pulse: 79  Resp: 16  SpO2: 98%  Weight: (!) 312 lb 3.2 oz (141.6 kg)  Height: 5' 6.5 (1.689 m)    Body mass index is 49.64 kg/m.    PHQ2/9:    11/29/2023    9:21 AM 08/24/2023    9:14 AM 05/30/2023    2:54 PM 04/26/2023    9:06 AM 10/04/2022    3:10 PM  Depression screen PHQ 2/9  Decreased Interest 0 0 0 0 0  Down, Depressed, Hopeless 0 0 0 0 0  PHQ - 2 Score 0 0 0 0 0  Altered sleeping 0  0 0 0  Tired, decreased energy 0  0 0 0  Change in appetite 0  0 0 0  Feeling bad or failure about yourself  0  0 0 0  Trouble concentrating 0  0 0 0  Moving slowly or fidgety/restless 0  0 0 0  Suicidal thoughts 0  0 0 0  PHQ-9 Score 0  0 0 0  Difficult doing work/chores Not difficult at all  Not difficult at all Not difficult at all     phq 9 is negative  Fall Risk:    11/29/2023    9:21 AM 08/24/2023    9:11 AM 04/26/2023    9:02 AM 10/04/2022    3:06 PM 06/28/2022    3:23 PM  Fall Risk   Falls in the past year? 0 0 0 0 0  Number falls in past yr: 0 0 0    Injury with Fall? 0 0 0    Risk for fall due to : No Fall Risks No Fall Risks No Fall Risks No Fall Risks No Fall  Risks  Follow up Falls evaluation completed Falls prevention discussed;Education provided;Falls evaluation completed Falls prevention discussed;Education provided;Falls evaluation completed Falls prevention discussed Falls prevention discussed     Assessment & Plan Morbid Obesity  Weight loss from 331 lbs to 312.2 lbs with Zepbound . Current weight plateau. Increased Zepbound  dose to 10 mg. Encouraged dietary changes and increased activity. - Continue Zepbound  10 mg. - Encourage dietary modifications: reduce sugar intake, increase protein, eliminate ultra-processed foods, focus on whole foods. - Encourage physical activity: continue walking routine at work. - Follow up in 3 months to assess weight and medication efficacy.  Prediabetes Previous A1c 5.8%. Weight loss and dietary changes expected to improve glycemic control. GLP-1 agonist therapy with Zepbound  anticipated to reduce A1c. - Continue dietary modifications to support weight loss and glycemic control. - Monitor A1c levels at follow-up.  Hypertension  Well-controlled with current medication. Blood pressure 26/74 mmHg. No side effects reported. - Continue amlodipine -benazepril  5/20 mg daily. - Continue HCTZ 12.5 mg daily.  Obstructive sleep apnea Managed with CPAP. Uses CPAP nightly. Awaiting further weight loss before considering CPAP titration. - Continue CPAP use. - Reassess need for CPAP titration after further weight loss.  Osteoarthritis of knees Occasional pain managed with topical treatments. No swelling or redness. - Continue use of topical treatments for pain management. - Consider Tylenol  for additional pain relief if needed.  Vitamin D  deficiency Vitamin D  levels stable with supplementation. - Continue vitamin D  supplementation.  Dyslipidemia Managed with rosuvastatin . Improved LDL levels at 83 mg/dL. - Continue rosuvastatin  20 mg daily.

## 2024-01-06 ENCOUNTER — Other Ambulatory Visit: Payer: Self-pay | Admitting: Family Medicine

## 2024-01-06 DIAGNOSIS — E785 Hyperlipidemia, unspecified: Secondary | ICD-10-CM

## 2024-01-06 DIAGNOSIS — G4700 Insomnia, unspecified: Secondary | ICD-10-CM

## 2024-01-06 DIAGNOSIS — I10 Essential (primary) hypertension: Secondary | ICD-10-CM

## 2024-02-04 ENCOUNTER — Encounter: Payer: Self-pay | Admitting: Family Medicine

## 2024-02-05 ENCOUNTER — Telehealth: Payer: Self-pay | Admitting: Pharmacy Technician

## 2024-02-05 ENCOUNTER — Other Ambulatory Visit (HOSPITAL_COMMUNITY): Payer: Self-pay

## 2024-02-05 ENCOUNTER — Other Ambulatory Visit: Payer: Self-pay | Admitting: Family Medicine

## 2024-02-05 MED ORDER — ZEPBOUND 12.5 MG/0.5ML ~~LOC~~ SOAJ: 12.5000 mg | SUBCUTANEOUS | 0 refills | Status: AC

## 2024-02-05 NOTE — Telephone Encounter (Signed)
 Pharmacy Patient Advocate Encounter   Received notification from CoverMyMeds that prior authorization for Zepbound  12.5MG /0.5ML pen-injectors is required/requested.   Insurance verification completed.   The patient is insured through [facility].   Per test claim: The current 28 day co-pay is, $75.00.  No PA needed at this time. This test claim was processed through [facility]- copay amounts may vary at other pharmacies due to pharmacy/plan contracts, or as the patient moves through the different stages of their insurance plan.

## 2024-02-06 ENCOUNTER — Other Ambulatory Visit (HOSPITAL_COMMUNITY): Payer: Self-pay

## 2024-02-08 ENCOUNTER — Other Ambulatory Visit (HOSPITAL_COMMUNITY): Payer: Self-pay

## 2024-02-12 ENCOUNTER — Other Ambulatory Visit (HOSPITAL_COMMUNITY): Payer: Self-pay

## 2024-02-12 ENCOUNTER — Encounter: Payer: Self-pay | Admitting: Family Medicine

## 2024-02-15 ENCOUNTER — Other Ambulatory Visit (HOSPITAL_COMMUNITY): Payer: Self-pay

## 2024-02-28 ENCOUNTER — Ambulatory Visit: Admitting: Family Medicine

## 2024-02-28 ENCOUNTER — Encounter: Payer: Self-pay | Admitting: Family Medicine

## 2024-02-28 VITALS — BP 130/72 | HR 82 | Resp 16 | Ht 66.5 in | Wt 307.3 lb

## 2024-02-28 DIAGNOSIS — G4700 Insomnia, unspecified: Secondary | ICD-10-CM

## 2024-02-28 DIAGNOSIS — R7303 Prediabetes: Secondary | ICD-10-CM

## 2024-02-28 DIAGNOSIS — G4733 Obstructive sleep apnea (adult) (pediatric): Secondary | ICD-10-CM

## 2024-02-28 DIAGNOSIS — E785 Hyperlipidemia, unspecified: Secondary | ICD-10-CM

## 2024-02-28 DIAGNOSIS — I10 Essential (primary) hypertension: Secondary | ICD-10-CM

## 2024-02-28 DIAGNOSIS — M170 Bilateral primary osteoarthritis of knee: Secondary | ICD-10-CM

## 2024-02-28 DIAGNOSIS — Z6841 Body Mass Index (BMI) 40.0 and over, adult: Secondary | ICD-10-CM

## 2024-02-28 MED ORDER — TRAZODONE HCL 100 MG PO TABS: 100.0000 mg | ORAL_TABLET | Freq: Every day | ORAL | 1 refills | Status: AC

## 2024-02-28 MED ORDER — AMLODIPINE BESY-BENAZEPRIL HCL 5-20 MG PO CAPS: 1.0000 | ORAL_CAPSULE | Freq: Every day | ORAL | 1 refills | Status: AC

## 2024-02-28 MED ORDER — HYDROCHLOROTHIAZIDE 12.5 MG PO TABS: 12.5000 mg | ORAL_TABLET | Freq: Every day | ORAL | 1 refills | Status: AC

## 2024-02-28 MED ORDER — ROSUVASTATIN CALCIUM 20 MG PO TABS: 20.0000 mg | ORAL_TABLET | Freq: Every day | ORAL | 1 refills | Status: AC

## 2024-02-28 NOTE — Progress Notes (Signed)
 Name: Emma Oconnor   MRN: [id number]    DOB: 06-08-1955   Date:02/28/2024       Progress Note  Subjective  Chief Complaint  Chief Complaint  Patient presents with   Medical Management of Chronic Issues   Discussed the use of AI scribe software for clinical note transcription with the patient, who gave verbal consent to proceed.  History of Present Illness Emma Oconnor is a 68 year old female who presents for a three-month follow-up on Zepbound  therapy.  She has been on Zepbound  since January for weight management. Her weight has decreased from 331 lbs in January to 307.3 lbs today, with a previous weight of 312.2 lbs at the last visit. Her weight loss has slowed recently. She is currently on a 12.5 mg dose of Zepbound , which was increased since the last visit.  Her diet includes yogurt, peanut butter, and bread, but she has stopped yogurt due to its sweetness and lack of protein. She occasionally grazes at work due to anxiety and stress, often choosing snacks from vending machines. She has tried to mitigate this by bringing low-calorie popcorn to work. She also uses almond butter and natural peanut butter.  She experiences knee pain, particularly when standing up after sitting for long periods, which improves with movement. She has a history of bilateral knee osteoarthritis and occasionally takes meloxicam  for flare-ups.  She is compliant with her CPAP machine for sleep apnea. Her current medications include rosuvastatin  20 mg for dyslipidemia, HCTZ 12.5 mg, and amlodipine -benazepril  5/20 mg for hypertension. No muscle aches from rosuvastatin  but some knee discomfort. She experiences burping and gas, described as 'rotting egg' smell, which she attributes to Zepbound .  Her depression is slightly better, though work stress is high due to being short-staffed. She manages her fluid intake carefully to avoid frequent bathroom trips at work, which affects her hydration and sleep patterns.     Patient Active Problem List   Diagnosis Date Noted   B12 deficiency 11/29/2023   Pre-diabetes 11/29/2023   History of colonic polyps 11/29/2022   Polyp of ascending colon 11/29/2022   Primary osteoarthritis of both knees 12/17/2021   Morbid obesity with BMI of 45.0-49.9, adult (HCC) 12/17/2021   Major depression in remission 12/17/2021   Benign neoplasm of ascending colon    Benign neoplasm of sigmoid colon    Benign essential HTN 10/25/2014   Bursitis of shoulder 10/25/2014   Insomnia, persistent 10/25/2014   Dyslipidemia 10/25/2014   Female stress incontinence 10/25/2014   Eczema intertrigo 10/25/2014   Menopause 10/25/2014   Dysmetabolic syndrome 10/25/2014   OSA on CPAP 10/25/2014   Vitamin D  deficiency 10/25/2014    Past Surgical History:  Procedure Laterality Date   COLONOSCOPY WITH PROPOFOL  N/A 02/17/2015   Procedure: COLONOSCOPY WITH PROPOFOL ;  Surgeon: Rogelia Copping, MD;  Location: [facility] ENDOSCOPY;  Service: Endoscopy;  Laterality: N/A;   COLONOSCOPY WITH PROPOFOL  N/A 11/29/2022   Procedure: COLONOSCOPY WITH PROPOFOL ;  Surgeon: Copping Rogelia, MD;  Location[facility] ENDOSCOPY;  Service: Endoscopy;  Laterality: N/A;   HOT HEMOSTASIS  11/29/2022   Procedure: HOT HEMOSTASIS (ARGON PLASMA COAGULATION/BICAP);  Surgeon: Copping Rogelia, MD;  Location: [facility] ENDOSCOPY;  Service: Endoscopy;;   POLYPECTOMY  11/29/2022   Procedure: POLYPECTOMY;  Surgeon: Copping Rogelia, MD;  Location: [facility] ENDOSCOPY;  Service: Endoscopy;;   TUBAL LIGATION      Family History  Problem Relation Age of Onset   Diabetes Mother    Hypertension Mother    Hyperlipidemia Mother  Multiple sclerosis Brother        03/08/11 passed away   Diabetes Brother    Breast cancer Neg Hx     Social History   Tobacco Use   Smoking status: Former    Current packs/day: 0.00    Average packs/day: 0.5 packs/day for 30.7 years (15.3 ttl pk-yrs)    Types: Cigarettes    Start date: 04/19/1975    Quit date: 12/17/2005    Years  since quitting: 18.2   Smokeless tobacco: Never  Substance Use Topics   Alcohol use: No    Alcohol/week: 0.0 standard drinks of alcohol     Current Outpatient Medications:    acetaminophen  (TYLENOL ) 500 MG tablet, Take 1 tablet (500 mg total) by mouth every 8 (eight) hours as needed., Disp: 90 tablet, Rfl: 0   Ascorbic Acid (VITAMIN C) 100 MG tablet, Take 100 mg by mouth daily., Disp: , Rfl:    Cyanocobalamin (B-12) 1000 MCG SUBL, Place 1 each under the tongue daily. M-F, Disp: , Rfl:    desonide  (DESOWEN ) 0.05 % cream, Apply topically 2 (two) times daily., Disp: 30 g, Rfl: 0   diclofenac  Sodium (VOLTAREN ) 1 % GEL, Apply 4 g topically 4 (four) times daily., Disp: 300 g, Rfl: 1   magnesium 30 MG tablet, Take 30 mg by mouth 2 (two) times daily., Disp: , Rfl:    meloxicam  (MOBIC ) 7.5 MG tablet, Take 7.5 mg by mouth daily., Disp: , Rfl:    tirzepatide  (ZEPBOUND ) 12.5 MG/0.5ML Pen, Inject 12.5 mg into the skin once a week., Disp: 6 mL, Rfl: 0   Vitamin D , Cholecalciferol , 25 MCG (1000 UT) TABS, Take 1 tablet by mouth daily., Disp: 30 tablet, Rfl: 0   zinc gluconate 50 MG tablet, Take 50 mg by mouth daily., Disp: , Rfl:    amLODipine -benazepril  (LOTREL) 5-20 MG capsule, Take 1 capsule by mouth daily., Disp: 90 capsule, Rfl: 1   hydrochlorothiazide  (HYDRODIURIL ) 12.5 MG tablet, Take 1 tablet (12.5 mg total) by mouth daily., Disp: 90 tablet, Rfl: 1   rosuvastatin  (CRESTOR ) 20 MG tablet, Take 1 tablet (20 mg total) by mouth daily., Disp: 90 tablet, Rfl: 1   traZODone  (DESYREL ) 100 MG tablet, Take 1 tablet (100 mg total) by mouth at bedtime., Disp: 90 tablet, Rfl: 1  No Known Allergies  I personally reviewed active problem list, medication list, allergies with the patient/caregiver today.   ROS  Ten systems reviewed and is negative except as mentioned in HPI    Objective Physical Exam  CONSTITUTIONAL: Patient appears well-developed and well-nourished.  No distress. HEENT: Head atraumatic,  normocephalic, neck supple. CARDIOVASCULAR: Normal rate, regular rhythm and normal heart sounds.  No murmur heard. No BLE edema. PULMONARY: Effort normal and breath sounds normal. No respiratory distress. ABDOMINAL: There is no tenderness or distention. MUSCULOSKELETAL: Normal gait. Without gross motor or sensory deficit. PSYCHIATRIC: Patient has a normal mood and affect. behavior is normal. Judgment and thought content normal.  Vitals:   02/28/24 1515 02/28/24 1544  BP: (!) 146/80 130/72  Pulse: 82   Resp: 16   SpO2: 99%   Weight: (!) 307 lb 4.8 oz (139.4 kg)   Height: 5' 6.5 (1.689 m)     Body mass index is 48.86 kg/m.    PHQ2/9:    02/28/2024    3:11 PM 11/29/2023    9:21 AM 08/24/2023    9:14 AM 05/30/2023    2:54 PM 04/26/2023    9:06 AM  Depression screen PHQ  2/9  Decreased Interest 0 0 0 0 0  Down, Depressed, Hopeless 0 0 0 0 0  PHQ - 2 Score 0 0 0 0 0  Altered sleeping 0 0  0 0  Tired, decreased energy 0 0  0 0  Change in appetite 0 0  0 0  Feeling bad or failure about yourself  0 0  0 0  Trouble concentrating 0 0  0 0  Moving slowly or fidgety/restless 0 0  0 0  Suicidal thoughts 0 0  0 0  PHQ-9 Score 0 0   0  0   Difficult doing work/chores Not difficult at all Not difficult at all  Not difficult at all Not difficult at all     Data saved with a previous flowsheet row definition    phq 9 is negative  Fall Risk:    02/28/2024    3:11 PM 11/29/2023    9:21 AM 08/24/2023    9:11 AM 04/26/2023    9:02 AM 10/04/2022    3:06 PM  Fall Risk   Falls in the past year? 0 0 0 0 0  Number falls in past yr: 0 0 0 0   Injury with Fall? 0 0 0 0   Risk for fall due to : No Fall Risks No Fall Risks No Fall Risks No Fall Risks No Fall Risks  Follow up Falls evaluation completed Falls evaluation completed Falls prevention discussed;Education provided;Falls evaluation completed Falls prevention discussed;Education provided;Falls evaluation completed Falls prevention discussed     Assessment & Plan Morbid obesity due to excess calories Weight loss with Zepbound  12.5 mg continues, though slowed. Dietary changes made. Anxiety-related grazing at work persists. - Continue Zepbound  12.5 mg. - Encouraged dietary modifications, including portion control and healthier snack choices. - Suggested bringing healthy snacks like popcorn, apples, and mixed nuts to work. - Recommended considering frittata or protein shakes for breakfast to prevent grazing. - Encouraged drinking water early in the morning to manage bathroom frequency at work. - Suggested herbal teas for hydration and as a distraction from grazing.  Obstructive sleep apnea Compliance with CPAP therapy reported. - Continue CPAP therapy.  Essential hypertension Blood pressure slightly elevated today, likely due to stress. Usual blood pressure is well-controlled. - Continue current antihypertensive regimen: HCTZ 12.5 mg, amlodipine , and benazepril  5/20 mg.  Prediabetes A1c expected to improve with weight loss and dietary modifications. - Continue monitoring A1c levels.  Hyperlipidemia Currently on rosuvastatin  20 mg with no reported side effects. - Continue rosuvastatin  20 mg.  Bilateral primary osteoarthritis of knee Intermittent knee pain, especially after prolonged sitting. Previous x-ray confirmed bilateral knee arthritis. Meloxicam  used occasionally for pain relief, but not recommended long-term due to hypertension and potential kidney effects. - Use Tylenol  for pain management as needed. - Limit meloxicam  use to short-term relief only.  Insomnia Sleep quality has improved slightly. Trazodone  prescribed for sleep. - Continue trazodone  for sleep.  Depression Reports improvement in depression symptoms. - Continue current management.

## 2024-05-31 ENCOUNTER — Ambulatory Visit: Admitting: Family Medicine

## 2024-06-26 ENCOUNTER — Encounter: Admitting: Family Medicine
